# Patient Record
Sex: Female | Born: 1985 | Race: Black or African American | Hispanic: No | Marital: Single | Smoking: Never smoker
Health system: Southern US, Community
[De-identification: ages and names within clinical notes are randomized; demographics above are authoritative.]

## PROBLEM LIST (undated history)

## (undated) ENCOUNTER — Inpatient Hospital Stay (HOSPITAL_COMMUNITY): Payer: Self-pay

## (undated) DIAGNOSIS — M545 Low back pain, unspecified: Secondary | ICD-10-CM

## (undated) DIAGNOSIS — J45909 Unspecified asthma, uncomplicated: Secondary | ICD-10-CM

## (undated) DIAGNOSIS — Z789 Other specified health status: Secondary | ICD-10-CM

## (undated) DIAGNOSIS — O24419 Gestational diabetes mellitus in pregnancy, unspecified control: Secondary | ICD-10-CM

## (undated) DIAGNOSIS — G4730 Sleep apnea, unspecified: Secondary | ICD-10-CM

## (undated) DIAGNOSIS — O139 Gestational [pregnancy-induced] hypertension without significant proteinuria, unspecified trimester: Secondary | ICD-10-CM

## (undated) DIAGNOSIS — R0789 Other chest pain: Secondary | ICD-10-CM

## (undated) DIAGNOSIS — N920 Excessive and frequent menstruation with regular cycle: Secondary | ICD-10-CM

## (undated) DIAGNOSIS — A609 Anogenital herpesviral infection, unspecified: Secondary | ICD-10-CM

## (undated) DIAGNOSIS — F419 Anxiety disorder, unspecified: Secondary | ICD-10-CM

## (undated) DIAGNOSIS — F32A Depression, unspecified: Secondary | ICD-10-CM

## (undated) DIAGNOSIS — T7840XA Allergy, unspecified, initial encounter: Secondary | ICD-10-CM

## (undated) DIAGNOSIS — N83209 Unspecified ovarian cyst, unspecified side: Secondary | ICD-10-CM

## (undated) DIAGNOSIS — Z8632 Personal history of gestational diabetes: Secondary | ICD-10-CM

## (undated) DIAGNOSIS — Z8759 Personal history of other complications of pregnancy, childbirth and the puerperium: Secondary | ICD-10-CM

## (undated) DIAGNOSIS — I10 Essential (primary) hypertension: Secondary | ICD-10-CM

## (undated) DIAGNOSIS — T8859XA Other complications of anesthesia, initial encounter: Secondary | ICD-10-CM

## (undated) DIAGNOSIS — E119 Type 2 diabetes mellitus without complications: Secondary | ICD-10-CM

## (undated) DIAGNOSIS — F909 Attention-deficit hyperactivity disorder, unspecified type: Secondary | ICD-10-CM

## (undated) DIAGNOSIS — Z973 Presence of spectacles and contact lenses: Secondary | ICD-10-CM

## (undated) DIAGNOSIS — T4145XA Adverse effect of unspecified anesthetic, initial encounter: Secondary | ICD-10-CM

## (undated) HISTORY — DX: Unspecified asthma, uncomplicated: J45.909

## (undated) HISTORY — DX: Low back pain, unspecified: M54.50

## (undated) HISTORY — PX: OTHER SURGICAL HISTORY: SHX169

## (undated) HISTORY — DX: Depression, unspecified: F32.A

## (undated) HISTORY — DX: Adverse effect of unspecified anesthetic, initial encounter: T41.45XA

## (undated) HISTORY — PX: DILATION AND CURETTAGE OF UTERUS: SHX78

## (undated) HISTORY — PX: INDUCED ABORTION: SHX677

## (undated) HISTORY — DX: Anxiety disorder, unspecified: F41.9

## (undated) HISTORY — DX: Gestational (pregnancy-induced) hypertension without significant proteinuria, unspecified trimester: O13.9

## (undated) HISTORY — PX: BACK SURGERY: SHX140

## (undated) HISTORY — DX: Allergy, unspecified, initial encounter: T78.40XA

## (undated) HISTORY — DX: Anogenital herpesviral infection, unspecified: A60.9

## (undated) HISTORY — DX: Other complications of anesthesia, initial encounter: T88.59XA

## (undated) HISTORY — DX: Sleep apnea, unspecified: G47.30

---
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
## 2006-01-09 ENCOUNTER — Emergency Department (HOSPITAL_COMMUNITY): Admission: EM | Admit: 2006-01-09 | Discharge: 2006-01-10 | Payer: Self-pay | Admitting: Emergency Medicine

## 2006-01-16 ENCOUNTER — Emergency Department (HOSPITAL_COMMUNITY): Admission: EM | Admit: 2006-01-16 | Discharge: 2006-01-16 | Payer: Self-pay | Admitting: *Deleted

## 2006-08-09 ENCOUNTER — Other Ambulatory Visit: Admission: RE | Admit: 2006-08-09 | Discharge: 2006-08-09 | Payer: Self-pay | Admitting: Gynecology

## 2006-12-07 ENCOUNTER — Emergency Department (HOSPITAL_COMMUNITY): Admission: EM | Admit: 2006-12-07 | Discharge: 2006-12-08 | Payer: Self-pay | Admitting: Emergency Medicine

## 2007-03-02 ENCOUNTER — Ambulatory Visit: Payer: Self-pay | Admitting: Obstetrics & Gynecology

## 2007-03-16 ENCOUNTER — Inpatient Hospital Stay (HOSPITAL_COMMUNITY): Admission: AD | Admit: 2007-03-16 | Discharge: 2007-03-16 | Payer: Self-pay | Admitting: Obstetrics & Gynecology

## 2007-03-18 ENCOUNTER — Ambulatory Visit: Payer: Self-pay | Admitting: Obstetrics & Gynecology

## 2007-05-03 ENCOUNTER — Ambulatory Visit (HOSPITAL_COMMUNITY): Admission: RE | Admit: 2007-05-03 | Discharge: 2007-05-03 | Payer: Self-pay | Admitting: Obstetrics and Gynecology

## 2007-05-03 ENCOUNTER — Ambulatory Visit: Payer: Self-pay | Admitting: Obstetrics & Gynecology

## 2007-05-03 HISTORY — PX: DIAGNOSTIC LAPAROSCOPY: SUR761

## 2007-05-11 ENCOUNTER — Ambulatory Visit: Payer: Self-pay | Admitting: Obstetrics and Gynecology

## 2007-05-11 ENCOUNTER — Inpatient Hospital Stay (HOSPITAL_COMMUNITY): Admission: AD | Admit: 2007-05-11 | Discharge: 2007-05-11 | Payer: Self-pay | Admitting: Obstetrics and Gynecology

## 2007-06-10 ENCOUNTER — Ambulatory Visit: Payer: Self-pay | Admitting: Obstetrics & Gynecology

## 2009-06-07 ENCOUNTER — Inpatient Hospital Stay (HOSPITAL_COMMUNITY): Admission: AD | Admit: 2009-06-07 | Discharge: 2009-06-07 | Payer: Self-pay | Admitting: Obstetrics & Gynecology

## 2009-11-02 ENCOUNTER — Ambulatory Visit: Payer: Self-pay | Admitting: Gynecology

## 2009-11-02 ENCOUNTER — Inpatient Hospital Stay (HOSPITAL_COMMUNITY): Admission: AD | Admit: 2009-11-02 | Discharge: 2009-11-02 | Payer: Self-pay | Admitting: Obstetrics & Gynecology

## 2010-07-27 LAB — WET PREP, GENITAL
Trich, Wet Prep: NONE SEEN
Trich, Wet Prep: NONE SEEN
Yeast Wet Prep HPF POC: NONE SEEN
Yeast Wet Prep HPF POC: NONE SEEN

## 2010-07-27 LAB — URINALYSIS, ROUTINE W REFLEX MICROSCOPIC
Bilirubin Urine: NEGATIVE
Hgb urine dipstick: NEGATIVE
Ketones, ur: NEGATIVE mg/dL
Nitrite: NEGATIVE
pH: 7 (ref 5.0–8.0)

## 2010-07-27 LAB — POCT PREGNANCY, URINE: Preg Test, Ur: NEGATIVE

## 2010-07-27 LAB — GC/CHLAMYDIA PROBE AMP, GENITAL: Chlamydia, DNA Probe: NEGATIVE

## 2010-09-23 NOTE — Group Therapy Note (Signed)
NAMEELMYRA, [name] NO.:  [id number]   MEDICAL RECORD NO.:  [id number]          PATIENT TYPE:  WOC   LOCATION:  [facility]                   FACILITY:  [facility]   PHYSICIAN:  Johnella Moloney, MD        DATE OF BIRTH:  [birth year]   DATE OF SERVICE:                                  CLINIC NOTE   CHIEF COMPLAINT:  Chronic pelvic pain.   HISTORY OF PRESENT ILLNESS:  The patient is a 25 year old, G1, P0-0-1-0,  with a 2-year history of chronic pelvic pain.  The patient does report  that over the last 2 years she has had increase in pelvic pain.  It  usually starts a few days before her period and lasts through her  periods.  It is severe in intensity and very debilitating and it  interferes with her going to school.  She had a recent ultrasound in  September 2008, which showed a 3-cm complex ovarian cyst concerning for  possible endometrioma.  She also had other workup including cervical  cultures which are negative and a urine culture which was also negative.  She was to be scheduled for a diagnostic laparoscopy with her former  physician but was unable to do it because of insurance issues.  She was  then referred here for further evaluation and also for scheduling of  laparoscopy.   REVIEW OF SYSTEMS:  The patient does report having significant urgency  especially during her menstrual period.  She feels like this is  exacerbated by te amount of pain that she has.  At baseline she denies  any other urinary symptoms or gastrointestinal symptoms.  The patient is  also sexually active but denies any dyspareunia or any other symptoms.   PAST MEDICAL HISTORY:  None.   PAST SURGICAL HISTORY:  None.   PAST OBSTETRICAL/GYNECOLOGIC HISTORY:  1. One miscarriage.  2. Menarche at age 22 with regular periods lasting 4 days.  Severe      pain with periods.  No intramenstrual bleeding.  3. The patient is currently on Yaz which says does not help her pain.   MEDICATIONS:  Yaz 1  tab p.o. daily.   ALLERGIES:  1. PENICILLIN.  2. ERYTHROMYCIN which cause hives.   SOCIAL HISTORY:  The patient is a Consulting civil engineer.  She denies any abuse.  Denies any smoking, alcohol, or any other illicit drugs.   REVIEW OF SYSTEMS:  The patient does report some shortness of breath  with exercise but has not been evaluated by her primary care physician.  No other symptoms.   PHYSICAL EXAMINATION:  VITAL SIGNS:  Temperature 98.1, blood pressure  113/70, pulse 85, weight 145 pounds.  GENERAL:  No apparent distress.  PELVIC:  Mild tenderness to palpation of uterus and bilateral adnexa,  right greater than left.  No cervical motion tenderness.  Normal size  uterus palpated that was mobile.  No abnormal masses palpated.  Normal  external female genitalia.   ASSESSMENT/PLAN:  The patient is a 25 year old with a history of chronic  pelvic pain.  Her history and description of the pain is concerning for  possible  endometriosis.  We will go ahead and schedule the patient for  diagnostic laparoscopy for possible evaluation for endometriosis.  Also  discussed with the patient that if endometriosis is found, she might  need further therapy after surgery such as further oral contraceptive  therapy versus Lupron.  We will defer detailed discussion about these  modalities until after surgery.  The patient will be contacted with  surgery date.           ______________________________  Johnella Moloney, MD     UD/MEDQ  D:  03/02/2007  T:  03/03/2007  Job:  [id number]

## 2010-09-23 NOTE — Op Note (Signed)
NAMEJASIEL, [name] NO.:  [id number]   MEDICAL RECORD NO.:  [id number]          PATIENT TYPE:  AMB   LOCATION:  [facility]                           FACILITY:  [facility]   PHYSICIAN:  Norton Blizzard, MD    DATE OF BIRTH:  04-02-1986   DATE OF PROCEDURE:  05/03/2007  DATE OF DISCHARGE:                               OPERATIVE REPORT   PREOPERATIVE DIAGNOSIS:  Chronic pelvic pain.   POSTOPERATIVE DIAGNOSIS:  Chronic pelvic pain.   OPERATION:  Diagnostic laparoscopy.   SURGEON:  Johnella Moloney, M.D.   ASSISTANT:  Lesly Dukes, M.D.   ANESTHESIA:  General.   IV FLUIDS:  1000 mL.   ESTIMATED BLOOD LOSS:  Minimal.   URINE OUTPUT:  20 mL.   INDICATIONS:  The patient is a 25 year old G1, P0-0-1-0, with a history  of chronic pelvic pain for 2 years.  On a prior scan, the patient was  noted to have a 3 cm complex ovarian cyst concerning for an  endometrioma.  Given this concern, she was booked for diagnostic  laparoscopy for further evaluation.  Of note, on a followup scan, the  patient's ovarian cyst was noted to be resolved; however, the patient  wanted to proceed with surgery, given her long history of chronic pelvic  pain, requiring narcotics.  On the day of surgery, the risks of the  surgery were discussed, and written informed consent was obtained.   FINDINGS:  Normal uterus, ovaries, tubes, pelvic and abdominal anatomy.  No endometrial lesions noted.   SPECIMENS:  None.   PROCEDURE IN DETAIL:  The patient was taken to the operating room where  general anesthesia was placed and found to be adequate.  She was then  placed in the dorsal lithotomy position, and prepped and draped in a  sterile manner.  A uterine manipulator was placed, and a Foley was also  attached to her bladder and attached to constant gravity.  Attention was  then turned to the patient's umbilicus where an incision was made, and a  Veress needle was inserted into the peritoneal cavity.   Correct  placement was confirmed with low intra-abdominal pressure upon gas  insufflation; opening pressure was noted to be 6 mmHg.  The abdomen was  insufflated to 15 mmHg, and the Veress needle was removed, and a 10 mm  trocar was then placed.  The laparoscope was then placed, and also used  to confirm correct placement.  Of note, the operative laparoscope was  placed at this time.  A survey of the pelvis revealed normal anatomy and  no endometriotic lesions.  Survey of the upper abdomen also revealed  normal anatomy, and no lesions were seen on all organs.  A blunt probe  was used to move the bowels away, and further visualization of her  bilateral adnexae and cul-de-sac revealed no lesions of endometriosis,  or any other etiology for her pelvic pain.  The laparoscope was then  removed.  The abdomen was desufflated, and the incisional site fascia  was reapproximated with a 0 Vicryl figure-of-eight stitch.  The skin was  closed with 4-0 Monocryl in interrupted stitches.  The patient tolerated  the procedure well.  Sponge, lap, needle, and instrument counts were  correct x2.  The uterine manipulator and the Foley were removed at the  end of the case.  She was taken to the recovery room awake, extubated,  and in stable condition.      Norton Blizzard, MD  Electronically Signed     UAD/MEDQ  D:  05/03/2007  T:  05/03/2007  Job:  [id number]

## 2011-01-11 ENCOUNTER — Inpatient Hospital Stay (HOSPITAL_COMMUNITY)
Admission: AD | Admit: 2011-01-11 | Discharge: 2011-01-11 | Disposition: A | Discharge status: Home / Self Care | Payer: Self-pay | Source: Ambulatory Visit | Attending: Obstetrics and Gynecology | Admitting: Obstetrics and Gynecology

## 2011-01-11 DIAGNOSIS — H109 Unspecified conjunctivitis: Secondary | ICD-10-CM

## 2011-01-11 MED ORDER — TOBRAMYCIN-DEXAMETHASONE 0.3-0.1 % OP SUSP: 1.0000 [drp] | Freq: Two times a day (BID) | OPHTHALMIC | Status: AC

## 2011-01-11 NOTE — Progress Notes (Signed)
Pt c/o having pink eye in both eyes.

## 2011-01-11 NOTE — ED Provider Notes (Signed)
History     Chief Complaint  Patient presents with  . Conjunctivitis   HPI Presents with complaint of "pink eye".  Has had redness and discharge from her eyes for 2 days. States was originally only in left eye, now both. Took contacts out yesterday.  Cannot see eye doctor until Saturday.  No past medical history on file.  No past surgical history on file.  No family history on file.  History  Substance Use Topics  . Smoking status: Not on file  . Smokeless tobacco: Not on file  . Alcohol Use: Not on file    Allergies: Allergies not on file  No prescriptions prior to admission    ROS As above  Physical Exam   Blood pressure 132/91, pulse 73, temperature 98.1 F (36.7 C), resp. rate 16, last menstrual period 01/08/2011.  Physical Exam  Constitutional: She is oriented to person, place, and time. She appears well-developed and well-nourished.  HENT:  Head: Normocephalic.  Eyes: Right eye exhibits discharge. Left eye exhibits discharge.       Bilateral injection of sclera and conjuctiva, mild to moderate.   Neurological: She is alert and oriented to person, place, and time.  Skin: Skin is warm and dry.  Psychiatric: She has a normal mood and affect.    MAU Course  Procedures  MDM   Assessment and Plan  A:  Bilateral conjuctivitis, unclear whether viral or bacterial, but based on report of drainage will presume bacterial P:  Rx Tobradex Advised to see Eye Doctor this week Advised to avoid wearing contact lenses until after treatment and approved by Opthamologist.  Wynelle Bourgeois 01/11/2011, 10:23 AM

## 2011-02-13 LAB — CBC
MCHC: 35.2
Platelets: 330
RBC: 4.59
WBC: 5.9

## 2011-02-13 LAB — PREGNANCY, URINE: Preg Test, Ur: NEGATIVE

## 2011-02-13 LAB — URINALYSIS, ROUTINE W REFLEX MICROSCOPIC
Bilirubin Urine: NEGATIVE
Glucose, UA: NEGATIVE
Hgb urine dipstick: NEGATIVE
Ketones, ur: NEGATIVE
Specific Gravity, Urine: <1.005 — ABNORMAL LOW
pH: 7

## 2011-02-17 LAB — WET PREP, GENITAL
Clue Cells Wet Prep HPF POC: NONE SEEN
Trich, Wet Prep: NONE SEEN
Yeast Wet Prep HPF POC: NONE SEEN

## 2011-02-17 LAB — URINALYSIS, ROUTINE W REFLEX MICROSCOPIC
Bilirubin Urine: NEGATIVE
Glucose, UA: NEGATIVE
Hgb urine dipstick: NEGATIVE
Ketones, ur: NEGATIVE
Nitrite: NEGATIVE
Protein, ur: NEGATIVE
Specific Gravity, Urine: 1.02
Urobilinogen, UA: 2 — ABNORMAL HIGH
pH: 6

## 2011-02-17 LAB — POCT PREGNANCY, URINE
Operator id: 266701
Preg Test, Ur: NEGATIVE

## 2011-02-17 LAB — GC/CHLAMYDIA PROBE AMP, GENITAL: Chlamydia, DNA Probe: NEGATIVE

## 2011-02-23 LAB — WET PREP, GENITAL
Trich, Wet Prep: NONE SEEN
Yeast Wet Prep HPF POC: NONE SEEN

## 2011-02-23 LAB — URINALYSIS, ROUTINE W REFLEX MICROSCOPIC
Bilirubin Urine: NEGATIVE
Glucose, UA: NEGATIVE
Hgb urine dipstick: NEGATIVE
Ketones, ur: NEGATIVE
Protein, ur: 100 — AB
pH: 6

## 2011-02-23 LAB — GC/CHLAMYDIA PROBE AMP, GENITAL
Chlamydia, DNA Probe: NEGATIVE
GC Probe Amp, Genital: NEGATIVE

## 2011-02-23 LAB — URINE MICROSCOPIC-ADD ON

## 2011-03-25 ENCOUNTER — Emergency Department (HOSPITAL_COMMUNITY): Payer: Self-pay

## 2011-03-25 ENCOUNTER — Encounter: Payer: Self-pay | Admitting: Emergency Medicine

## 2011-03-25 ENCOUNTER — Emergency Department (HOSPITAL_COMMUNITY)
Admission: EM | Admit: 2011-03-25 | Discharge: 2011-03-25 | Disposition: A | Discharge status: Home / Self Care | Payer: Self-pay | Attending: Emergency Medicine | Admitting: Emergency Medicine

## 2011-03-25 DIAGNOSIS — Z79899 Other long term (current) drug therapy: Secondary | ICD-10-CM | POA: Insufficient documentation

## 2011-03-25 DIAGNOSIS — R059 Cough, unspecified: Secondary | ICD-10-CM | POA: Insufficient documentation

## 2011-03-25 DIAGNOSIS — J40 Bronchitis, not specified as acute or chronic: Secondary | ICD-10-CM | POA: Insufficient documentation

## 2011-03-25 DIAGNOSIS — R509 Fever, unspecified: Secondary | ICD-10-CM | POA: Insufficient documentation

## 2011-03-25 DIAGNOSIS — R05 Cough: Secondary | ICD-10-CM | POA: Insufficient documentation

## 2011-03-25 DIAGNOSIS — R5381 Other malaise: Secondary | ICD-10-CM | POA: Insufficient documentation

## 2011-03-25 DIAGNOSIS — R0789 Other chest pain: Secondary | ICD-10-CM | POA: Insufficient documentation

## 2011-03-25 DIAGNOSIS — R000 Tachycardia, unspecified: Secondary | ICD-10-CM | POA: Insufficient documentation

## 2011-03-25 DIAGNOSIS — J3489 Other specified disorders of nose and nasal sinuses: Secondary | ICD-10-CM | POA: Insufficient documentation

## 2011-03-25 DIAGNOSIS — R0602 Shortness of breath: Secondary | ICD-10-CM | POA: Insufficient documentation

## 2011-03-25 DIAGNOSIS — R5383 Other fatigue: Secondary | ICD-10-CM | POA: Insufficient documentation

## 2011-03-25 LAB — POCT I-STAT, CHEM 8
BUN: 5 mg/dL — ABNORMAL LOW (ref 6–23)
Creatinine, Ser: 1.2 mg/dL — ABNORMAL HIGH (ref 0.50–1.10)
Glucose, Bld: 77 mg/dL (ref 70–99)
Hemoglobin: 16 g/dL — ABNORMAL HIGH (ref 12.0–15.0)
Potassium: 4 mEq/L (ref 3.5–5.1)

## 2011-03-25 MED ORDER — IBUPROFEN 800 MG PO TABS
800.0000 mg | ORAL_TABLET | Freq: Once | ORAL | Status: AC
  Administered 2011-03-25: 800 mg via ORAL
  Filled 2011-03-25: qty 1

## 2011-03-25 MED ORDER — ALBUTEROL SULFATE HFA 108 (90 BASE) MCG/ACT IN AERS: 2.0000 | INHALATION_SPRAY | RESPIRATORY_TRACT | Status: DC | PRN

## 2011-03-25 MED ORDER — AZITHROMYCIN 250 MG PO TABS: 250.0000 mg | ORAL_TABLET | Freq: Every day | ORAL | Status: AC

## 2011-03-25 NOTE — ED Notes (Signed)
Pt ambulated to window with 2 prescriptions.

## 2011-03-25 NOTE — ED Notes (Signed)
Pt c/o chest congestion and dry cough. Denies n/v/d. Pt reports chills and fever for 2 days. Pt reports feeling tired and "like crap".

## 2011-03-25 NOTE — ED Provider Notes (Signed)
History     CSN: [id number] Arrival date & time: 03/25/2011  8:57 AM   First MD Initiated Contact with Patient 03/25/11 0908      No chief complaint on file.   (Consider location/radiation/quality/duration/timing/severity/associated sxs/prior treatment) HPI Comments: Patient presents with 3 days of chest tightness, cough, congestion, subjective fevers and chills. She sent home from work today because the symptoms. She's taking over-the-counter products including Robitussin for the symptoms. She describes cough is nonproductive. She's no history of asthma or smoke exposure. The tightness is in the center of her chest worsens with coughing. Denies any nausea, vomiting abdominal pain or diarrhea. No sick contacts.  She is not on birth control.  Endorses fatigue.  No sore throat, ear pain, pain with swallowing, rash.  The history is provided by the patient.    No past medical history on file.  No past surgical history on file.  No family history on file.  History  Substance Use Topics  . Smoking status: Never Smoker   . Smokeless tobacco: Never Used  . Alcohol Use:     OB History    Grav Para Term Preterm Abortions TAB SAB Ect Mult Living                  Review of Systems  Constitutional: Positive for fever and fatigue. Negative for activity change and appetite change.  HENT: Positive for congestion and rhinorrhea.   Eyes: Negative for visual disturbance.  Respiratory: Positive for cough, chest tightness and shortness of breath. Negative for wheezing.   Cardiovascular: Positive for chest pain.  Gastrointestinal: Negative for nausea, vomiting and abdominal pain.  Genitourinary: Negative for dysuria and hematuria.  Musculoskeletal: Negative for back pain.  Skin: Negative for rash.  Neurological: Negative for weakness and headaches.  Hematological: Negative.   Psychiatric/Behavioral: Negative.     Allergies  Penicillins  Home Medications   Current Outpatient Rx    Name Route Sig Dispense Refill  . GUAIFENESIN 100 MG/5ML PO SOLN Oral Take 20 mLs by mouth every 4 (four) hours as needed. Cough      . NAPROXEN SODIUM 220 MG PO TABS Oral Take 440 mg by mouth as needed. Pain     . OVER THE COUNTER MEDICATION Oral Take 1 Package by mouth daily. Gnc vitamin pack      . ALBUTEROL SULFATE HFA 108 (90 BASE) MCG/ACT IN AERS Inhalation Inhale 2 puffs into the lungs every 4 (four) hours as needed for wheezing. 1 Inhaler 0  . AZITHROMYCIN 250 MG PO TABS Oral Take 1 tablet (250 mg total) by mouth daily. Take first 2 tablets together, then 1 every day until finished. 6 tablet 0    BP 108/78  Pulse 120  Temp(Src) 99 F (37.2 C) (Oral)  Resp 18  SpO2 100%  LMP 03/18/2011  Physical Exam  Constitutional: She is oriented to person, place, and time. She appears well-developed and well-nourished. No distress.  HENT:  Head: Normocephalic and atraumatic.  Right Ear: External ear normal.  Left Ear: External ear normal.  Mouth/Throat: Oropharynx is clear and moist. No oropharyngeal exudate.  Eyes: Conjunctivae are normal. Pupils are equal, round, and reactive to light.  Neck: Normal range of motion.  Cardiovascular: Normal rate, regular rhythm and normal heart sounds.        Tachycardic  Pulmonary/Chest: Effort normal and breath sounds normal. No respiratory distress. She has no wheezes.  Abdominal: Soft. There is no tenderness. There is no rebound and no guarding.  Musculoskeletal: Normal range of motion. She exhibits no edema and no tenderness.       No leg pain or swelling.  Neurological: She is alert and oriented to person, place, and time. No cranial nerve deficit.  Skin: Skin is warm.    ED Course  Procedures (including critical care time)  Labs Reviewed  POCT I-STAT, CHEM 8 - Abnormal; Notable for the following:    BUN 5 (*)    Creatinine, Ser 1.20 (*)    Hemoglobin 16.0 (*)    HCT 47.0 (*)    All other components within normal limits  D-DIMER,  QUANTITATIVE  I-STAT, CHEM 8   Dg Chest 2 View  03/25/2011  *RADIOLOGY REPORT*  Clinical Data: Breath, mid chest pain for 2 days  CHEST - 2 VIEW  Comparison: None.  Findings: No active infiltrate or effusion is seen.  Mild peribronchial thickening is noted.  Mediastinal contours appear normal.  The heart is within normal limits in size.  No bony abnormality is seen.  IMPRESSION: No pneumonia.  Mild peribronchial thickening  Original Report Authenticated By: Juline Patch, M.D.     1. Bronchitis       MDM  Cough, congestion, subjective fevers without evidence of respiratory distress or hypoxia. Likely bronchitis. Patient is tachycardic on exam and complaining of chest pain. We'll obtain EKG and d-dimer.   Date: 03/25/2011  Rate: 100  Rhythm: normal sinus rhythm  QRS Axis: normal  Intervals: normal  ST/T Wave abnormalities: normal  Conduction Disutrbances:none  Narrative Interpretation:   Old EKG Reviewed: none available  Chest Xray neg.  D dimer neg.  HR has improved.  Will discharge with albuterol inhaler for bronchitis.        Glynn Octave, MD 03/25/11 1128

## 2011-03-25 NOTE — ED Notes (Signed)
Pt understanding of plan of care and ready for d/c. Walked to window.

## 2011-03-25 NOTE — ED Notes (Signed)
Patient transported to X-ray 

## 2011-04-13 ENCOUNTER — Inpatient Hospital Stay (HOSPITAL_COMMUNITY)
Admission: AD | Admit: 2011-04-13 | Discharge: 2011-04-13 | Disposition: A | Discharge status: Home / Self Care | Payer: Self-pay | Source: Ambulatory Visit | Attending: Obstetrics & Gynecology | Admitting: Obstetrics & Gynecology

## 2011-04-13 ENCOUNTER — Encounter (HOSPITAL_BASED_OUTPATIENT_CLINIC_OR_DEPARTMENT_OTHER): Payer: Self-pay | Admitting: *Deleted

## 2011-04-13 ENCOUNTER — Emergency Department (INDEPENDENT_AMBULATORY_CARE_PROVIDER_SITE_OTHER): Payer: Self-pay

## 2011-04-13 ENCOUNTER — Emergency Department (HOSPITAL_BASED_OUTPATIENT_CLINIC_OR_DEPARTMENT_OTHER)
Admission: EM | Admit: 2011-04-13 | Discharge: 2011-04-13 | Disposition: A | Discharge status: Home / Self Care | Payer: Self-pay | Attending: Emergency Medicine | Admitting: Emergency Medicine

## 2011-04-13 DIAGNOSIS — Y92410 Unspecified street and highway as the place of occurrence of the external cause: Secondary | ICD-10-CM | POA: Insufficient documentation

## 2011-04-13 DIAGNOSIS — S8000XA Contusion of unspecified knee, initial encounter: Secondary | ICD-10-CM | POA: Insufficient documentation

## 2011-04-13 DIAGNOSIS — M25569 Pain in unspecified knee: Secondary | ICD-10-CM

## 2011-04-13 MED ORDER — IBUPROFEN 800 MG PO TABS
800.0000 mg | ORAL_TABLET | Freq: Once | ORAL | Status: AC
  Administered 2011-04-13: 800 mg via ORAL
  Filled 2011-04-13: qty 1

## 2011-04-13 MED ORDER — IBUPROFEN 800 MG PO TABS: 800.0000 mg | ORAL_TABLET | Freq: Three times a day (TID) | ORAL | Status: AC
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
## 2011-04-13 NOTE — ED Notes (Signed)
Patient transported to X-ray 

## 2011-08-08 ENCOUNTER — Inpatient Hospital Stay (HOSPITAL_COMMUNITY)
Admission: AD | Admit: 2011-08-08 | Discharge: 2011-08-09 | Disposition: A | Discharge status: Home / Self Care | Payer: BC Managed Care – PPO | Source: Ambulatory Visit | Attending: Obstetrics & Gynecology | Admitting: Obstetrics & Gynecology

## 2011-08-08 ENCOUNTER — Encounter (HOSPITAL_COMMUNITY): Payer: Self-pay | Admitting: *Deleted

## 2011-08-08 ENCOUNTER — Inpatient Hospital Stay (HOSPITAL_COMMUNITY): Payer: BC Managed Care – PPO

## 2011-08-08 DIAGNOSIS — Z332 Encounter for elective termination of pregnancy: Secondary | ICD-10-CM

## 2011-08-08 DIAGNOSIS — R109 Unspecified abdominal pain: Secondary | ICD-10-CM | POA: Insufficient documentation

## 2011-08-08 DIAGNOSIS — Z9889 Other specified postprocedural states: Secondary | ICD-10-CM

## 2011-08-08 DIAGNOSIS — O074 Failed attempted termination of pregnancy without complication: Secondary | ICD-10-CM | POA: Insufficient documentation

## 2011-08-08 DIAGNOSIS — R112 Nausea with vomiting, unspecified: Secondary | ICD-10-CM

## 2011-08-08 HISTORY — DX: Unspecified ovarian cyst, unspecified side: N83.209

## 2011-08-08 HISTORY — DX: Other specified health status: Z78.9

## 2011-08-08 LAB — URINALYSIS, MICROSCOPIC ONLY
Bilirubin Urine: NEGATIVE
Glucose, UA: 100 mg/dL — AB
Ketones, ur: NEGATIVE mg/dL
Specific Gravity, Urine: <1.005 — ABNORMAL LOW (ref 1.005–1.030)
pH: 6 (ref 5.0–8.0)

## 2011-08-08 LAB — CBC
Hemoglobin: 14 g/dL (ref 12.0–15.0)
MCH: 30.8 pg (ref 26.0–34.0)
MCHC: 34.9 g/dL (ref 30.0–36.0)
MCV: 88.1 fL (ref 78.0–100.0)

## 2011-08-08 LAB — ABO/RH: ABO/RH(D): A POS

## 2011-08-08 MED ORDER — ONDANSETRON HCL 4 MG/2ML IJ SOLN
4.0000 mg | Freq: Once | INTRAMUSCULAR | Status: AC
  Administered 2011-08-08: 4 mg via INTRAVENOUS
  Filled 2011-08-08: qty 2

## 2011-08-08 MED ORDER — DEXTROSE 5 % IN LACTATED RINGERS IV BOLUS
1000.0000 mL | Freq: Once | INTRAVENOUS | Status: AC
  Administered 2011-08-08: 1000 mL via INTRAVENOUS

## 2011-08-08 MED ORDER — KETOROLAC TROMETHAMINE 60 MG/2ML IM SOLN
60.0000 mg | Freq: Once | INTRAMUSCULAR | Status: AC
  Administered 2011-08-08: 60 mg via INTRAMUSCULAR
  Filled 2011-08-08: qty 2

## 2011-08-08 NOTE — MAU Note (Signed)
Ivonne Andrew CNM made aware of pt's arrival and status.

## 2011-08-08 NOTE — MAU Provider Note (Signed)
History   [name] is a 26 y.o. year old G30P0020 female at 14.[redacted] weeks gestation who presents to MAU reporting severe cramping, moderate VB after taking cytotec Rx by AB clinic in [place] yesterday. Passed 1-1.5 cm whitish sac at home that looked like POC from prior EABs. States she had a [place] at the AB clinic. Reports not eating today per clinic instructions and feeling weak.  First Provider Initiated Contact with Patient 08/08/11 2122     CSN: [id number]  Arrival date & time 08/08/11  2040   None     Chief Complaint  Patient presents with  . Abdominal Pain  . Vaginal Bleeding    (Consider location/radiation/quality/duration/timing/severity/associated sxs/prior treatment) HPI  Past Medical History  Diagnosis Date  . No pertinent past medical history   . Ovarian cyst     Past Surgical History  Procedure Date  . Dilation and curettage of uterus   . Laporocscopy     Family History  Problem Relation Age of Onset  . Anesthesia problems Neg Hx   . Hypotension Neg Hx   . Malignant hyperthermia Neg Hx   . Pseudochol deficiency Neg Hx     History  Substance Use Topics  . Smoking status: Current Some Day Smoker  . Smokeless tobacco: Never Used  . Alcohol Use: No    OB History    Grav Para Term Preterm Abortions TAB SAB Ect Mult Living   3 0 0 0 2 2 0 0 0 0       Review of Systems  Constitutional: Negative for fever, chills and appetite change.  Gastrointestinal: Positive for nausea, vomiting and abdominal pain (low abd  cramping). Negative for diarrhea.  Genitourinary: Positive for vaginal bleeding (moderate--4 pads saturated in 1 day). Negative for dysuria and flank pain.  Skin: Negative for pallor.  Neurological: Positive for weakness. Negative for dizziness and syncope.  Hematological: Does not bruise/bleed easily.    Allergies  Penicillins  Home Medications  No current outpatient prescriptions on file.  BP 130/83  Pulse 84  Temp(Src) 97.7 F  (36.5 C) (Oral)  Resp 20  Ht 5\' 9"  (1.753 m)  Wt 73.483 kg (162 lb)  BMI 23.92 kg/m2  LMP 05/01/2011  Physical Exam  Nursing note and vitals reviewed. Constitutional: She is oriented to person, place, and time. She appears well-developed and well-nourished. She appears distressed (mildly).  Cardiovascular: Normal rate.   Pulmonary/Chest: Effort normal.  Abdominal: Soft. She exhibits no distension. There is no tenderness.  Genitourinary: There is no lesion on the right labia. There is no lesion on the left labia. Uterus is not enlarged and not tender. Cervix exhibits no motion tenderness, no discharge and no friability. Right adnexum displays no mass, no tenderness and no fullness. Left adnexum displays no mass, no tenderness and no fullness. There is bleeding (moderate BRB in vault. Small amount of bleeding from os.) around the vagina. No vaginal discharge found.  Neurological: She is alert and oriented to person, place, and time.  Skin: Skin is warm and dry. No pallor.  Psychiatric: She has a normal mood and affect.    ED Course  Procedures (including critical care time)  Results for orders placed during the hospital encounter of 08/08/11 (from the past 24 hour(s))  CBC     Status: Normal   Collection Time   08/08/11  9:20 PM      Component Value Range   WBC 8.1  4.0 - 10.5 (K/uL)   RBC 4.55  3.87 - 5.11 (MIL/uL)   Hemoglobin 14.0  12.0 - 15.0 (g/dL)   HCT 16.1  09.6 - 04.5 (%)   MCV 88.1  78.0 - 100.0 (fL)   MCH 30.8  26.0 - 34.0 (pg)   MCHC 34.9  30.0 - 36.0 (g/dL)   RDW 40.9  81.1 - 91.4 (%)   Platelets 323  150 - 400 (K/uL)  ABO/RH     Status: Normal   Collection Time   08/08/11  9:20 PM      Component Value Range   ABO/RH(D) A POS    HCG, QUANTITATIVE, PREGNANCY     Status: Abnormal   Collection Time   08/08/11  9:20 PM      Component Value Range   hCG, Beta Chain, Quant, S 78295 (*) <5 (mIU/mL)  URINALYSIS, WITH MICROSCOPIC     Status: Abnormal   Collection Time    08/08/11 11:00 PM      Component Value Range   Color, Urine YELLOW  YELLOW    APPearance CLEAR  CLEAR    Specific Gravity, Urine <1.005 (*) 1.005 - 1.030    pH 6.0  5.0 - 8.0    Glucose, UA 100 (*) NEGATIVE (mg/dL)   Hgb urine dipstick LARGE (*) NEGATIVE    Bilirubin Urine NEGATIVE  NEGATIVE    Ketones, ur NEGATIVE  NEGATIVE (mg/dL)   Protein, ur NEGATIVE  NEGATIVE (mg/dL)   Urobilinogen, UA 0.2  0.0 - 1.0 (mg/dL)   Nitrite NEGATIVE  NEGATIVE    Leukocytes, UA NEGATIVE  NEGATIVE    RBC / HPF 21-50  <3 (RBC/hpf)   Squamous Epithelial / LPF RARE  RARE    Urine-Other MUCOUS PRESENT    POCT PREGNANCY, URINE     Status: Abnormal   Collection Time   08/09/11  1:27 AM      Component Value Range   Preg Test, Ur POSITIVE (*) NEGATIVE    (Urine specimen collected after IV fluids initiated due to pt being unable to void prior.)  US Ob Comp Less 14 Wks  08/08/2011  *RADIOLOGY REPORT*  Clinical Data: Status post Cytotec today.  Rule out retained products of conception.  Bleeding and cramping.  5 weeks 2-day estimated gestational age by LMP.  No reported beta HCG.  OBSTETRIC <14 WK [place] AND TRANSVAGINAL OB US  Technique:  Both transabdominal and transvaginal ultrasound examinations were performed for complete evaluation of the gestation as well as the maternal uterus, adnexal regions, and pelvic cul-de-sac.  Transvaginal technique was performed to assess early pregnancy.  Comparison:  12/08/2006  Intrauterine gestational sac:  No intrauterine gestational sac visualized.  Heterogeneous appearing thickened endometrium measuring about 17 mm thickness.  No significant flow demonstrated on color flow Doppler imaging.  Findings are nonspecific and could represent retained products of conception versus blood clots. Yolk sac: Not visualized Embryo: Not visualized Cardiac Activity: Not visualized Heart Rate: N/A bpm  MSD: N/A  mm  N/A    w N/A    d CRL: N/A   mm  N/A   w  N/A   d           [place] EDC: N/A  Maternal  uterus/adnexae: No focal myometrial masses.  Left ovary measures 2.6 x 1.2 x 1.4 cm.  Normal follicular changes.  Right ovary measures 4.4 x 3 by 2.9 cm.  Simple cyst in the right ovary measuring about 2.5 cm diameter likely representing corpus luteum.  Small amount of free fluid in  the pelvis.  IMPRESSION: No intrauterine gestational sac visualized.  Thickened and heterogeneous appearance of the endometrium is nonspecific and could represent retained products of conception or blood clots. Probable corpus luteum cyst in the right ovary.  Small amount of free fluid.  Correlation with serial quantitative beta HCG levels may be useful in further evaluation if clinically indicated.  Original Report Authenticated By: Marlon Pel, M.D.   US Ob Transvaginal  08/08/2011  *RADIOLOGY REPORT*  Clinical Data: Status post Cytotec today.  Rule out retained products of conception.  Bleeding and cramping.  5 weeks 2-day estimated gestational age by LMP.  No reported beta HCG.  OBSTETRIC <14 WK [place] AND TRANSVAGINAL OB US  Technique:  Both transabdominal and transvaginal ultrasound examinations were performed for complete evaluation of the gestation as well as the maternal uterus, adnexal regions, and pelvic cul-de-sac.  Transvaginal technique was performed to assess early pregnancy.  Comparison:  12/08/2006  Intrauterine gestational sac:  No intrauterine gestational sac visualized.  Heterogeneous appearing thickened endometrium measuring about 17 mm thickness.  No significant flow demonstrated on color flow Doppler imaging.  Findings are nonspecific and could represent retained products of conception versus blood clots. Yolk sac: Not visualized Embryo: Not visualized Cardiac Activity: Not visualized Heart Rate: N/A bpm  MSD: N/A  mm  N/A    w N/A    d CRL: N/A   mm  N/A   w  N/A   d           [place] EDC: N/A  Maternal uterus/adnexae: No focal myometrial masses.  Left ovary measures 2.6 x 1.2 x 1.4 cm.  Normal follicular  changes.  Right ovary measures 4.4 x 3 by 2.9 cm.  Simple cyst in the right ovary measuring about 2.5 cm diameter likely representing corpus luteum.  Small amount of free fluid in the pelvis.  IMPRESSION: No intrauterine gestational sac visualized.  Thickened and heterogeneous appearance of the endometrium is nonspecific and could represent retained products of conception or blood clots. Probable corpus luteum cyst in the right ovary.  Small amount of free fluid.  Correlation with serial quantitative beta HCG levels may be useful in further evaluation if clinically indicated.  Original Report Authenticated By: Marlon Pel, M.D.   Tolerating POs after Zofran. Feeling better after IV fluids and toradol Minimal bleeding while in MAU  1. Status post elective abortion, documentation of IUP not available   2. Nausea & vomiting    MDM  D/C home Bleeding precautions Rx Zofran and Ibuprofen F/U in MAU in 1 week for quant Discussed contraception. Will F/U w/ [name], [name] 08/08/2011 9:08 PM

## 2011-08-08 NOTE — MAU Note (Signed)
Pt admitted via EMS to Rm # 5. Alert and oriented. Vomiting on arrival. Helped to change clothes. Warm blanket. Small amt vag bleeding. Call light within reach.

## 2011-08-08 NOTE — MAU Note (Signed)
G2P0 Went to abortion clinic in [place] yesterday. Given pills to cause abortion. Took one yest in office and 6 pills today. Thinks passed fetus at home. Having stomach pain and vaginal bleeding. Some vomiting

## 2011-08-08 NOTE — Progress Notes (Signed)
V.Smith CNM in to see pt. Spec exam done. Small-mod vag bleeding.

## 2011-08-09 LAB — HCG, QUANTITATIVE, PREGNANCY: hCG, Beta Chain, Quant, S: 10623 m[IU]/mL — ABNORMAL HIGH (ref <5)

## 2011-08-09 MED ORDER — IBUPROFEN 600 MG PO TABS: 600.0000 mg | ORAL_TABLET | Freq: Four times a day (QID) | ORAL | Status: DC | PRN

## 2011-08-09 MED ORDER — PROMETHAZINE HCL 25 MG PO TABS: 25.0000 mg | ORAL_TABLET | Freq: Four times a day (QID) | ORAL | Status: DC | PRN

## 2011-08-09 NOTE — MAU Note (Signed)
Dorathy Kinsman CNM in to discuss u/s results and d/c plan with pt. Pt to return in one wk for repeat BHCG. Pt agrees

## 2011-08-09 NOTE — Progress Notes (Signed)
Ivonne Andrew CNM in earlier to discuss d/c plan. Written and verbal d/c instructions given and understanding voiced

## 2011-08-12 ENCOUNTER — Encounter (HOSPITAL_COMMUNITY): Payer: Self-pay | Admitting: *Deleted

## 2011-08-12 ENCOUNTER — Inpatient Hospital Stay (HOSPITAL_COMMUNITY)
Admission: AD | Admit: 2011-08-12 | Discharge: 2011-08-12 | Disposition: A | Discharge status: Home / Self Care | Payer: BC Managed Care – PPO | Source: Ambulatory Visit | Attending: Obstetrics & Gynecology | Admitting: Obstetrics & Gynecology

## 2011-08-12 DIAGNOSIS — N925 Other specified irregular menstruation: Secondary | ICD-10-CM | POA: Insufficient documentation

## 2011-08-12 DIAGNOSIS — N939 Abnormal uterine and vaginal bleeding, unspecified: Secondary | ICD-10-CM

## 2011-08-12 DIAGNOSIS — R110 Nausea: Secondary | ICD-10-CM | POA: Insufficient documentation

## 2011-08-12 DIAGNOSIS — N949 Unspecified condition associated with female genital organs and menstrual cycle: Secondary | ICD-10-CM | POA: Insufficient documentation

## 2011-08-12 DIAGNOSIS — N898 Other specified noninflammatory disorders of vagina: Secondary | ICD-10-CM

## 2011-08-12 DIAGNOSIS — N938 Other specified abnormal uterine and vaginal bleeding: Secondary | ICD-10-CM | POA: Insufficient documentation

## 2011-08-12 LAB — CBC
MCH: 30.4 pg (ref 26.0–34.0)
MCHC: 33.6 g/dL (ref 30.0–36.0)
MCV: 90.4 fL (ref 78.0–100.0)
Platelets: 304 10*3/uL (ref 150–400)
RDW: 12.7 % (ref 11.5–15.5)

## 2011-08-12 MED ORDER — ONDANSETRON 8 MG PO TBDP: 8.0000 mg | ORAL_TABLET | Freq: Three times a day (TID) | ORAL | Status: AC | PRN

## 2011-08-12 NOTE — MAU Note (Signed)
Pt states bleeding has gotten worse since Saturday, after pt was discharged. And bleeding became worse.

## 2011-08-12 NOTE — Discharge Instructions (Signed)
Your hormone level is still pending.  We can call you with the results Plan to keep the follow up appointment you have in [place]. Your hemoglobin is normal. Get your prescription filled and take as needed for nausea.  It may cause constipation. Your blood pressure and pulse are stable.

## 2011-08-12 NOTE — MAU Note (Signed)
Patient states she had a confirmed SAB on 3-30 in MAU. States she continues to have bleeding, changing a pad every 2 hours. Minimal abdominal cramping.

## 2011-08-12 NOTE — MAU Provider Note (Signed)
History     CSN: [id number]  Arrival date and time: 08/12/11 1326   First Provider Initiated Contact with Patient 08/12/11 1555      Chief Complaint  Patient presents with  . Vaginal Bleeding   HPI [name] 26 y.o. Was at work today and was feeling weak, dizzy, and tired.  Thinks she is bleeding too much and wants the bleeding to stop.  Sent from work to be checked.  Having vaginal bleeding and nausea.  Had TAB in [place] on 08-07-11.  Was seen in MAU on 08-08-11.  Was to return for Quant on Saturday.  OB History    Grav Para Term Preterm Abortions TAB SAB Ect Mult Living   3 0 0 0 2 2 0 0 0 0       Past Medical History  Diagnosis Date  . No pertinent past medical history   . Ovarian cyst     Past Surgical History  Procedure Date  . Dilation and curettage of uterus   . Laporocscopy     Family History  Problem Relation Age of Onset  . Anesthesia problems Neg Hx   . Hypotension Neg Hx   . Malignant hyperthermia Neg Hx   . Pseudochol deficiency Neg Hx   . Hypertension Mother   . Hypertension Father     History  Substance Use Topics  . Smoking status: Current Some Day Smoker  . Smokeless tobacco: Never Used  . Alcohol Use: No    Allergies:  Allergies  Allergen Reactions  . Morphine And Related Itching    Lower doses OK  . Penicillins Other (See Comments)    Childhood reaction ( unknown)    Prescriptions prior to admission  Medication Sig Dispense Refill  . HYDROcodone-acetaminophen (VICODIN) 5-500 MG per tablet Take 1 tablet by mouth once. For pain. Only took one because it did not work.        Review of Systems  Constitutional: Positive for malaise/fatigue. Negative for fever.  Gastrointestinal: Positive for nausea. Negative for abdominal pain.  Genitourinary:       Vaginal bleeding  Neurological: Positive for dizziness and weakness.   Physical Exam   Blood pressure 121/73, pulse 72, temperature 98.5 F (36.9 C), temperature source Oral,  resp. rate 16, height 5\' 9"  (1.753 m), weight 165 lb (74.844 kg), last menstrual period 07/02/2011, SpO2 100.00%.  Physical Exam  Nursing note and vitals reviewed. Constitutional: She is oriented to person, place, and time. She appears well-developed and well-nourished.  HENT:  Head: Normocephalic.  Eyes: EOM are normal.  Neck: Neck supple.  GI: Soft.  Genitourinary:       Minimal dark blood on pad  Musculoskeletal: Normal range of motion.  Neurological: She is alert and oriented to person, place, and time.  Skin: Skin is warm and dry.  Psychiatric: She has a normal mood and affect.    MAU Course  Procedures Results for orders placed during the hospital encounter of 08/12/11 (from the past 24 hour(s))  CBC     Status: Normal   Collection Time   08/12/11  4:00 PM      Component Value Range   WBC 8.2  4.0 - 10.5 (K/uL)   RBC 4.05  3.87 - 5.11 (MIL/uL)   Hemoglobin 12.3  12.0 - 15.0 (g/dL)   HCT 84.6  96.2 - 95.2 (%)   MCV 90.4  78.0 - 100.0 (fL)   MCH 30.4  26.0 - 34.0 (pg)   MCHC 33.6  30.0 - 36.0 (g/dL)   RDW 16.1  09.6 - 04.5 (%)   Platelets 304  150 - 400 (K/uL)  HCG, QUANTITATIVE, PREGNANCY     Status: Abnormal   Collection Time   08/12/11  4:00 PM      Component Value Range   hCG, Beta Chain, Quant, S 488 (*) <5 (mIU/mL)   MDM Quant is pending.  Client unable to stay for results.  Needs to pick up child.  Hgb is normal.  Bleeding is no worse than on 08-08-11,  Will give Zofran for nausea and note to be out of work until Saturday.  Gave diet recall today of cinnamon bun, yogurt, soup and chicken sandwich.  States she is unable to take Phenergan and work.  Still having nausea.  Orthostatic vitals signs stable.    Assessment and Plan  Vaginal bleeding post TAB Nausea  Plan Will review quant when results are available Plan to keep follow up visit as scheduled in [place] for post TAB care Rx zofran 8 mg ODT bid PRN for nausea. Note for work given. 1723 Called client  after discharge to review quant results. Given that quant has dropped significantly, will not need to return on Saturday and advised to keep appointment in [place].  Client voices understanding.    [name] 08/12/2011, 4:27 PM

## 2011-08-15 ENCOUNTER — Inpatient Hospital Stay (HOSPITAL_COMMUNITY): Admission: RE | Admit: 2011-08-15 | Payer: No Typology Code available for payment source | Source: Ambulatory Visit

## 2011-11-29 DIAGNOSIS — R109 Unspecified abdominal pain: Secondary | ICD-10-CM | POA: Insufficient documentation

## 2011-11-29 DIAGNOSIS — N898 Other specified noninflammatory disorders of vagina: Secondary | ICD-10-CM | POA: Insufficient documentation

## 2011-11-30 ENCOUNTER — Emergency Department (HOSPITAL_BASED_OUTPATIENT_CLINIC_OR_DEPARTMENT_OTHER)
Admission: EM | Admit: 2011-11-30 | Discharge: 2011-11-30 | Discharge status: Against Medical Advice | Payer: BC Managed Care – PPO | Attending: Emergency Medicine | Admitting: Emergency Medicine

## 2011-12-01 DIAGNOSIS — F17200 Nicotine dependence, unspecified, uncomplicated: Secondary | ICD-10-CM | POA: Insufficient documentation

## 2011-12-01 DIAGNOSIS — Z0000 Encounter for general adult medical examination without abnormal findings: Secondary | ICD-10-CM | POA: Insufficient documentation

## 2011-12-01 DIAGNOSIS — Z8249 Family history of ischemic heart disease and other diseases of the circulatory system: Secondary | ICD-10-CM | POA: Insufficient documentation

## 2011-12-01 NOTE — ED Notes (Signed)
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

## 2011-12-02 ENCOUNTER — Encounter (HOSPITAL_BASED_OUTPATIENT_CLINIC_OR_DEPARTMENT_OTHER): Payer: Self-pay | Admitting: Emergency Medicine

## 2011-12-02 ENCOUNTER — Emergency Department (HOSPITAL_BASED_OUTPATIENT_CLINIC_OR_DEPARTMENT_OTHER)
Admission: EM | Admit: 2011-12-02 | Discharge: 2011-12-02 | Disposition: A | Discharge status: Home / Self Care | Payer: BC Managed Care – PPO | Attending: Emergency Medicine | Admitting: Emergency Medicine

## 2011-12-02 DIAGNOSIS — Z0000 Encounter for general adult medical examination without abnormal findings: Secondary | ICD-10-CM

## 2011-12-02 NOTE — ED Provider Notes (Signed)
History     CSN: [id number]  Arrival date & time 12/01/11  2350   First MD Initiated Contact with Patient 12/02/11 0012      Chief Complaint  Patient presents with  . 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 Pt has not complaints. Has completed the course before without difficulty. No chest pain or SOB with exercise. Only family history is of HTN. No family history of sudden cardiac death Past Medical History  Diagnosis Date  . No pertinent past medical history   . Ovarian cyst     Past Surgical History  Procedure Date  . Dilation and curettage of uterus   . Laporocscopy     Family History  Problem Relation Age of Onset  . Anesthesia problems Neg Hx   . Hypotension Neg Hx   . Malignant hyperthermia Neg Hx   . Pseudochol deficiency Neg Hx   . Hypertension Mother   . Hypertension Father     History  Substance Use Topics  . Smoking status: Current Some Day Smoker -- 0.5 packs/day  . Smokeless tobacco: Never Used  . Alcohol Use: No    OB History    Grav Para Term Preterm Abortions TAB SAB Ect Mult Living   3 0 0 0 2 2 0 0 0 0       Review of Systems  Respiratory: Negative for shortness of breath.   Cardiovascular: Negative for chest pain.  Neurological: Negative for dizziness, seizures, weakness, numbness and headaches.    Allergies  Morphine and related and Penicillins  Home Medications   Current Outpatient Rx  Name Route Sig Dispense Refill  . ADULT MULTIVITAMIN W/MINERALS CH Oral Take 1 tablet by mouth daily.      BP 130/66  Pulse 68  Temp 98 F (36.7 C) (Oral)  Resp 16  Ht 5\' 9"  (1.753 m)  Wt 158 lb (71.668 kg)  BMI 23.33 kg/m2  SpO2 100%  LMP 10/10/2011  Breastfeeding? Unknown  Physical Exam  Nursing note and vitals reviewed. Constitutional: She is oriented to person,  place, and time. She appears well-developed and well-nourished. No distress.  HENT:  Head: Normocephalic and atraumatic.  Mouth/Throat: Oropharynx is clear and moist. No oropharyngeal exudate.       Enlarged tonsils  Eyes: EOM are normal. Pupils are equal, round, and reactive to light.  Neck: Normal range of motion. Neck supple. No tracheal deviation present.  Cardiovascular: Normal rate, regular rhythm and normal heart sounds.  Exam reveals no gallop and no friction rub.   No murmur heard. Pulmonary/Chest: Effort normal and breath sounds normal. No stridor. No respiratory distress. She has no wheezes. She has no rales. She exhibits no tenderness.  Abdominal: Soft. Bowel sounds are normal. She exhibits no distension and no mass. There is no tenderness. There is no rebound and no guarding.  Musculoskeletal: Normal range of motion. She exhibits no edema and no tenderness.  Neurological: She is alert and oriented to person, place, and time.       5/5 motor, sensation intact, normal gait  Skin: Skin is warm and dry. No rash noted. She is not diaphoretic. No erythema.  Psychiatric: She has a normal mood and affect. Her behavior is normal.    ED Course  Procedures (including critical care time)  Labs Reviewed - No data to display No results found.   1. Routine history and physical examination of  adult       MDM  No concerning findings on history or physical exam to prevent pt from completing obstacle course        Loren Racer, MD 12/02/11 [phone]

## 2011-12-10 DIAGNOSIS — Z720 Tobacco use: Secondary | ICD-10-CM | POA: Insufficient documentation

## 2012-04-22 ENCOUNTER — Inpatient Hospital Stay (HOSPITAL_COMMUNITY)
Admission: AD | Admit: 2012-04-22 | Discharge: 2012-04-22 | Disposition: A | Discharge status: Home / Self Care | Payer: BC Managed Care – PPO | Source: Ambulatory Visit | Attending: Obstetrics and Gynecology | Admitting: Obstetrics and Gynecology

## 2012-04-22 ENCOUNTER — Encounter (HOSPITAL_COMMUNITY): Payer: Self-pay | Admitting: *Deleted

## 2012-04-22 DIAGNOSIS — R1032 Left lower quadrant pain: Secondary | ICD-10-CM | POA: Insufficient documentation

## 2012-04-22 DIAGNOSIS — M79609 Pain in unspecified limb: Secondary | ICD-10-CM | POA: Insufficient documentation

## 2012-04-22 DIAGNOSIS — R51 Headache: Secondary | ICD-10-CM | POA: Insufficient documentation

## 2012-04-22 MED ORDER — IBUPROFEN 800 MG PO TABS
800.0000 mg | ORAL_TABLET | Freq: Once | ORAL | Status: AC
  Administered 2012-04-22: 800 mg via ORAL
  Filled 2012-04-22: qty 1

## 2012-04-22 NOTE — MAU Provider Note (Signed)
History     Chief Complaint  Patient presents with  . Leg Pain  . Abdominal Pain   26 yo P0 SBF presents with c/o leg pain and LLQ pain. Pt also c/o vaginal spotting. Pt  Has nexplanon. She was seen in office 12/5 and c/o left sided pain at that time. She had a pelvic exam and STD screen  and underwent sonogram which was normal. The pain is unchanged. Pt also c/o headache for several days but only took tylenol 500 mg x 1. Pt c/o leg pain starting today. Pt wore her high heels in to ER today  OB History    Grav Para Term Preterm Abortions TAB SAB Ect Mult Living   4 0 0 0 3 3 0 0 0 0       Past Medical History  Diagnosis Date  . No pertinent past medical history   . Ovarian cyst     Past Surgical History  Procedure Date  . Dilation and curettage of uterus   . Laporocscopy     Family History  Problem Relation Age of Onset  . Anesthesia problems Neg Hx   . Hypotension Neg Hx   . Malignant hyperthermia Neg Hx   . Pseudochol deficiency Neg Hx   . Hypertension Mother   . Hypertension Father     History  Substance Use Topics  . Smoking status: Current Some Day Smoker -- 0.5 packs/day  . Smokeless tobacco: Never Used  . Alcohol Use: No    Allergies:  Allergies  Allergen Reactions  . Morphine And Related Itching    Lower doses OK  . Penicillins Other (See Comments)    Childhood reaction ( unknown)    Prescriptions prior to admission  Medication Sig Dispense Refill  . acetaminophen (TYLENOL) 500 MG tablet Take 500 mg by mouth once.      . etonogestrel (NEXPLANON) 68 MG IMPL implant Inject 1 each into the skin once. Pt had it implanted 09/2011, in right arm      . Norethin-Eth Estrad-Fe Biphas (LO LOESTRIN FE PO) Take 1 tablet by mouth 2 (two) times daily. Pt started taking a couple of weeks ago (04/10/12). Started with 1 tablet qid for several days, then 1 tablet tid for several days and is currently taking 1 tablet bid         Physical Exam   Blood pressure 115/61,  pulse 86, temperature 97.1 F (36.2 C), temperature source Oral, resp. rate 16, height 5\' 9"  (1.753 m), weight 72.576 kg (160 lb), last menstrual period 07/02/2011, not currently breastfeeding.  General appearance: alert, cooperative and no distress Abdomen: soft nondistended mild tender LLQ no rebound Extremities: no edema. (+) tender medial left calf ED Course   LLQ pain  Previously evaluated on office 12/5 w/ nl pelvic sonogram Left leg pain P) dooplers left leg MDM   Gweneth Fredlund A, MD 4:37 PM 04/22/2012      addendum. Nl dopplers. D/c home. F/u PCP for llq pain

## 2012-04-22 NOTE — Progress Notes (Signed)
VASCULAR LAB PRELIMINARY  PRELIMINARY  PRELIMINARY  PRELIMINARY  Left lower extremity venous duplex completed.    Preliminary report:  Left:  No evidence of DVT, superficial thrombosis, or Baker's cyst.  Jinger Middlesworth, RVS 04/22/2012, 5:55 PM

## 2012-04-22 NOTE — MAU Note (Signed)
C/o abdominal pain and leg pain for past 3 days; had an normal u/s in Dr [employer] office  on Thursday; negative UPT in office;

## 2012-05-17 ENCOUNTER — Encounter: Payer: Self-pay | Admitting: Obstetrics and Gynecology

## 2012-05-18 ENCOUNTER — Encounter: Payer: Self-pay | Admitting: Obstetrics and Gynecology

## 2014-03-12 ENCOUNTER — Encounter (HOSPITAL_COMMUNITY): Payer: Self-pay | Admitting: *Deleted

## 2015-05-12 HISTORY — PX: LUMBAR FUSION: SHX111

## 2015-09-04 ENCOUNTER — Inpatient Hospital Stay
Payer: No Typology Code available for payment source | Attending: Orthopaedic Surgery | Admitting: Rehabilitative and Restorative Service Providers"

## 2015-09-04 ENCOUNTER — Encounter: Payer: Self-pay | Admitting: Rehabilitative and Restorative Service Providers"

## 2015-09-04 VITALS — BP 136/92 | HR 114

## 2015-09-04 DIAGNOSIS — M5442 Lumbago with sciatica, left side: Secondary | ICD-10-CM | POA: Insufficient documentation

## 2015-09-04 DIAGNOSIS — M5416 Radiculopathy, lumbar region: Secondary | ICD-10-CM

## 2015-09-04 DIAGNOSIS — M5440 Lumbago with sciatica, unspecified side: Secondary | ICD-10-CM | POA: Insufficient documentation

## 2015-09-04 NOTE — PT/OT Exercise Plan (Signed)
Name: Alyssa Rowe  Referring Physician: Denton Ar, MD PHD  Diagnosis:     ICD-10-CM    1. Acute left-sided low back pain with left-sided sciatica M54.42    2. Lumbar radiculopathy M54.16         Precautions:  Hx of lumbar discectomy and fusion (L4-S1) Date of Surgery:    MD Follow-up: 10/01/15          Exercise Flow Sheet    Exercise Specifics 09/04/2015               TM               Prone lying/prone on elbows               PPT with marches/SLR                 Bridges                 Prone UE/LE lifts                 clams               Supermans                 Planks                                  Manual               MOC   No traction              Home Exercise Program                 (Initials = supervised exercise by clinician)    Plan Of Care: Body Mechanics Education, NMR, Proprioceptive Activites, Electrical Stimulation, Instruction in HEP, Ultrasound, Therapeutic Exercise, Balance/Gait training, Soft Tissue/Joint Mobilization STM and MFR of lumbosacral spine and Other: taping    Frequency/Duration: 2 times a week for 12 sessions. Certification Status Ends: 11/09/2015    Goals:  Date (Body Area, Impairment Goal, Functional   Activity, Target Performance) Time Frame Status Date/  Initial   09/04/2015   Patient will demonstrate independence in prescribed HEP with proper form, sets and reps for safe discharge to an independent program.  12 sessions Initial Eval    09/04/2015   Demonstrate proper posture and body mechanics with sitting so patient can sit > 2 hours.  12 sessions Initial Eval    09/04/2015   Decrease Oswestry Low Back Pain Questionnaire to 30% to exceed Minimal Detectable Change (MDC) of 10 points.  12 sessions Initial Eval    09/04/2015   Patient is able to achieve prone plank hold for 30 seconds to demonstrate improved core stabilization for reduced LBP with prolonged standing and walking > 1 hr  12 sessions Initial Eval    09/04/2015   Reduced L piriformis spasm to be able to maintain  pelvic alignment and reduce neural tension for centralization of pain to LB only  12 sessions Initial Eval      Signature: [name]. [place], PT, DPT [place] # [phone] Date: 09/04/2015    Signature: Denton Ar, MD PHD _______________________  Date:  ________________     Patient Name: Alyssa Rowe  MRN: [id number]

## 2015-09-04 NOTE — PT/OT Therapy Note (Signed)
INITIAL EVALUATION (Lumbar)    Name: Alyssa Rowe Age: 30 y.o. 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 Acute left-sided low back pain with left-sided sciatica M54.42    2. Lumbar radiculopathy M54.16         SUBJECTIVE:    Mechanism of Injury: Patient reports LBP and L radicular pain in 2014.  She had lumbar microdiscectomy in 2014 with only a couple months of symptom relief.  Patient states that she had lumbar L5-S1 discectomy and lumbar fusion in 2015.  She states that she had been symptom free until 2 months ago.  She states that there was no known trigger.  Lumbar MRI (see below).  Patient has not had injections but started acupuncture last Wednesday.  She states that it's helping with symptom management.    Patient's reason for seeking PT /Functional Limitations (PLOF): Difficulty sitting for more than 30-60 minutes; static standing for more than 30-60 minutes and walking more than 30 minutes; (Patient states that she had been symptom relief from surgery in 2015 until 2 months ago and did not have mobility or positioning restrictions)    Past Medical History:   Past Medical History   Diagnosis Date   . Asthma    . Low back pain      Medications: .  CYCLOBENZAPRINE HCL PO  .  IBUPROFEN PO   Fall Risks: Medium, History of fall(s)    Other Treatment/Prior Therapy: Yes acupuncture x 2 sessions to date  Prior Hospitalization: No  Hand Dominance: Dominant Hand: Right Involved Side: Involved Side: Left   DiagnosticTests: lumbar MRI (slight translation of spacer and L L2 3 mm disc protrusion)    Outcome Measure:   Oswestry Score: 48                                      % Pain Score: 50% Rate Satisfaction with Current Function: 6/10   Living Environment: Type  of Residence: One story home/apartment      Dwelling Entrance: # Steps to Enter: 15   Patient lives with: Living Arrangements: Spouse/significant other    OBJECTIVE:    Vitals: BP: (!) 136/92 mmHg Heart Rate: (!) 114 Comments: taken in L UE while seated    Observation/Posture/Gait/Integumentary  Observation: Patient is a pleasant 30 y.o female in no signs of acute distress  Posture: Deficits noted: Pes Planus bilateral; Pelvic asymmetry in sitting/standing  Gait: Within functional limits  Integumentary: No wound, lesion or rash noted    AROM: Lumbar Spine   Initial      Flexion 80      Extension 22       R L R L   Side Bending 32 32 increased L LE numbness     (blank fields were intentionally left blank)    Repeated flexion/extension centralizes symptoms? No    Initial R   R LE Strength  MMT /5 Initial  L   L   4+  Hip Flexion(L1/2) 4    5  Hip Adduction 5    5  Hip Abduction 5    5  Knee Flexion (S1) 5    5  Knee Extension (L3) 5    5  Ankle DF (L4) 5    5  Ankle PF 5    (blank fields were intentionally left blank)      Functional Strength:   Sit to Stand: Independent    Palpation: Pain to palpation: lower lumbar spine     Joint Mobility Assessment: hypermobile upper lumbar spine     Sensation to Light touch: Intact    Special Tests:  Normal B HS flexibility:  90 deg SLR bilat  Normal gastroc flexibility  B hip PROM WFL  Negative SLR Test  L piriformis spasm (reduced prone hip IR)    Treatment Initial Visit:  Evaluation    Therapeutic Activity: Patient education on role of PT, POC, SI dysfunction pathology vs HNP. Discussed importance of foot foot support for standing during vocational demands and recreation- referred patient to [facility] for assessment for new tennis shoes.  Also mentioned [employer] for new summer sandals.  Recommended orthotics for B feet to insert in current shoes that are in good shape  Manual: 3 plane sacrum and L piriformis S-CS; DTM to L gluteal  Rehab  Potential:good  Is patient aware of diagnosis: Yes  For Next Visit Add Core stabilization program and reassess alignment    [name]. Tilman Neat, DPT [place] # [phone]  09/04/2015      Total Treatment (billable) Time:  40     Total Timed Minutes:  25    Plan of Care IPTC Medicare Provider #: [phone]                Patient Name: Alyssa Rowe  MRN: [id number]  DOI: Onset of Problem / Injury: 07/07/15 DOS: N/A  SOC:09/04/2015    Diagnosis:     ICD-10-CM    1. Acute left-sided low back pain with left-sided sciatica M54.42    2. Lumbar radiculopathy M54.16        ASSESSMENT: the patient is a 30 y.o. 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    Pain located: LB and L posterior leg    Clinical presentation: evolving due to reoccurrence of symptoms 2 months ago    Functional Limitations (PLOF): Patient states that she had been symptom relief from surgery in 2015 until 2 months ago and did not have mobility or positioning restrictions)    Plan Of Care: Body Mechanics Education, NMR, Proprioceptive Activites, Electrical Stimulation, Instruction in HEP, Ultrasound, Therapeutic Exercise, Balance/Gait training, Soft Tissue/Joint Mobilization STM and MFR of lumbosacral spine and Other: taping    Frequency/Duration: 2 times a week for 12 sessions. Certification Status Ends: 11/09/2015    Goals:  Date (Body Area, Impairment Goal, Functional   Activity, Target Performance) Time Frame Status Date/  Initial   09/04/2015   Patient will demonstrate independence in prescribed HEP with proper form, sets and reps for safe discharge to an independent program.  12 sessions Initial Eval    09/04/2015    Demonstrate proper posture and body mechanics with sitting so patient can sit > 2 hours.  12 sessions Initial Eval    09/04/2015   Decrease Oswestry Low Back Pain Questionnaire to 30% to exceed Minimal  Detectable Change (MDC) of 10 points.  12 sessions Initial Eval    09/04/2015   Patient is able to achieve prone plank hold for 30 seconds to demonstrate improved core stabilization for reduced LBP with prolonged standing and walking > 1 hr  12 sessions Initial Eval    09/04/2015   Reduced L piriformis spasm to be able to maintain pelvic alignment and reduce neural tension for centralization of pain to LB only  12 sessions Initial Eval      Signature: [name]. [place], PT, DPT [place] # [phone] Date: 09/04/2015    Signature: Denton Ar, MD PHD _______________________  Date:  ________________     Patient Name: Alyssa Rowe  MRN: [id number]

## 2015-09-04 NOTE — PT/OT Plan of Care (Signed)
Plan of Care IPTC Medicare Provider #: [phone]                Patient Name: Alyssa Rowe  MRN: [id number]  DOI: Onset of Problem / Injury: 07/07/15 DOS: N/A  SOC:09/04/2015    Diagnosis:     ICD-10-CM    1. Acute left-sided low back pain with left-sided sciatica M54.42    2. Lumbar radiculopathy M54.16        ASSESSMENT: the patient is a 30 y.o. 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    Pain located: LB and L posterior leg    Clinical presentation: evolving due to reoccurrence of symptoms 2 months ago    Functional Limitations (PLOF): Patient states that she had been symptom relief from surgery in 2015 until 2 months ago and did not have mobility or positioning restrictions)    Plan Of Care: Body Mechanics Education, NMR, Proprioceptive Activites, Electrical Stimulation, Instruction in HEP, Ultrasound, Therapeutic Exercise, Balance/Gait training, Soft Tissue/Joint Mobilization STM and MFR of lumbosacral spine and Other: taping    Frequency/Duration: 2 times a week for 12 sessions. Certification Status Ends: 11/09/2015    Goals:  Date (Body Area, Impairment Goal, Functional   Activity, Target Performance) Time Frame Status Date/  Initial   09/04/2015   Patient will demonstrate independence in prescribed HEP with proper form, sets and reps for safe discharge to an independent program.  12 sessions Initial Eval    09/04/2015   Demonstrate proper posture and body mechanics with sitting so patient can sit > 2 hours.  12 sessions Initial Eval    09/04/2015   Decrease Oswestry Low Back Pain Questionnaire to 30% to exceed Minimal Detectable Change (MDC) of 10 points.  12  sessions Initial Eval    09/04/2015   Patient is able to achieve prone plank hold for 30 seconds to demonstrate improved core stabilization for reduced LBP with prolonged standing and walking > 1 hr  12 sessions Initial Eval    09/04/2015   Reduced L piriformis spasm to be able to maintain pelvic alignment and reduce neural tension for centralization of pain to LB only  12 sessions Initial Eval      Signature: [name]. [place], PT, DPT [place] # [phone] Date: 09/04/2015    Signature: Denton Ar, MD PHD _______________________  Date:  ________________     Patient Name: Alyssa Rowe  MRN: [id number]

## 2015-09-06 ENCOUNTER — Inpatient Hospital Stay: Payer: No Typology Code available for payment source | Attending: Orthopaedic Surgery

## 2015-09-06 DIAGNOSIS — M5416 Radiculopathy, lumbar region: Secondary | ICD-10-CM | POA: Insufficient documentation

## 2015-09-06 DIAGNOSIS — M5442 Lumbago with sciatica, left side: Secondary | ICD-10-CM | POA: Insufficient documentation

## 2015-09-06 NOTE — PT/OT Exercise Plan (Signed)
Name: Alyssa Rowe  Referring Physician: Denton Ar, MD PHD  Diagnosis:     ICD-10-CM    1. Acute left-sided low back pain with left-sided sciatica M54.42    2. Lumbar radiculopathy M54.16         Precautions:  Hx of lumbar discectomy and fusion (L4-S1) Date of Surgery:    MD Follow-up: 10/01/15          Exercise Flow Sheet    Exercise Specifics 09/04/2015 09/06/15              TM   6'  AD            Prone lying/prone on elbows   Prone laying 1' no pillow AD    1' POEs with 1 pillow under hips  AD    Prone laying with 1 pillow 1'  AD    POEs with 1 pillow under hips 1' AD            PPT with marches/SLR     NT            Bridges     When checking alignment , verbal instruction for home AD            Prone UE/LE lifts     UEs 10x each with 1 pillow under hips AD            clams   NT            Supermans     NT            Planks     NT                             Manual   See note AD            MOC   No traction  MH/ES  AD            Home Exercise Program     Verbal bridges and PPT for home  AD Give copies of HEP  From last visit  AD           (Initials = supervised exercise by clinician)    Plan Of Care: Body Mechanics Education, NMR, Proprioceptive Activites, Electrical Stimulation, Instruction in HEP, Ultrasound, Therapeutic Exercise, Balance/Gait training, Soft Tissue/Joint Mobilization STM and MFR of lumbosacral spine and Other: taping    Frequency/Duration: 2 times a week for 12 sessions. Certification Status Ends: 11/09/2015    Goals:  Date (Body Area, Impairment Goal, Functional   Activity, Target Performance) Time Frame Status Date/  Initial   09/04/2015   Patient will demonstrate independence in prescribed HEP with proper form, sets and reps for safe discharge to an independent program.  12 sessions Initial Eval    09/04/2015   Demonstrate proper posture and body mechanics with sitting so patient can sit > 2 hours.  12 sessions Initial Eval    09/04/2015   Decrease Oswestry Low Back Pain Questionnaire to  30% to exceed Minimal Detectable Change (MDC) of 10 points.  12 sessions Initial Eval    09/04/2015   Patient is able to achieve prone plank hold for 30 seconds to demonstrate improved core stabilization for reduced LBP with prolonged standing and walking > 1 hr  12 sessions Initial Eval    09/04/2015   Reduced L piriformis spasm to be able to maintain pelvic alignment and reduce neural tension for  centralization of pain to LB only  12 sessions Initial Eval      Signature: [name]. [place], PT, DPT  # [phone] Date: 09/04/2015    Signature: Denton Ar, MD PHD _______________________  Date:  ________________     Patient Name: Alyssa Rowe  MRN: [id number]

## 2015-09-06 NOTE — PT/OT Therapy Note (Signed)
DAILY NOTE   09/06/2015        Total Treatment (billable)Time: 55'   Total Timed Minutes:  56'  Visit Number:  2      Payor: Advertising copywriter / Plan: [employer] [phone] PO [id number] NON OPTIONS / Product Type: *No Product type* /    # of Authorized Visits: 24 Visit #: 1      Diagnosis (Treating/Medical):     ICD-10-CM    1. Acute left-sided low back pain with left-sided sciatica M54.42    2. Lumbar radiculopathy M54.16          Subjective:  Keiva states she had to drive down to [facility]. Yesterday 3 hrs. There and 3 hrs.  Back.  I had my dog with me, so I stopped midway through treatment and walked around.  I have no problem with walking.  I was feeling the pain while driving and down there, but I pushed through. I feel it on my L side.  Functional Status: none.    Objective:   Treatment:  Therapeutic Exercise: to improve: Flexibility/ROM, Stabilization and Strength   Warm-up:  TM x 6' with subjective  Modifications/Patient Education: initiated exercises - see flow sheet.  Discussed LLD and told patient I will discuss it with her evaluating therapist as well as what was seen today.  Mentioned a possible heel lift and how back pain can contribute to pelvis asymmetry. Verbal instruction to do PPT seated or supine at home and bridges.      Manual Therapy:   Bilateral SKTC, piriformis, and HS stretching  Check pelvis alignment - noticeable L LE shorter supine  Tried STM to L LB prone with 1 pillow under hips, then rechecked with only slight improvement with alignment.  Tried L hip extension and R hip flexion with no improvement      Initial Evaluation Reference and/orCurrent Measurements (ROM, Strength, Girth, Outcomes, etc.):      Pelvis asymmetry - see above (not corrected with STM or METs)    Modalities: Electrical Stimulation with Heat: Premod 15 min. Location bilateral LB Position Supine 90/90  Therapy Rationale: Decrease Pain, Decrease Spasm, Decrease Inflammation and Increase Extensiblility       Assessment (response to  treatment):   Good LE flexibility bilaterally, but very tight L LB with STM.  Slight improvement with LLD when rechecked after STM.  No change when performing METs with LLD.  Patient complained of pressure feeling laying prone without a pillow and with a pillow, but none at all with POEs with 1 pillow under her hips.    Progress towards functional goals: nt    Patient requires continued skilled care to: increase in core strength for less back pain.    Plan:  Continue with Plan of Care.  Give the copies of the HEP that were given verbally today.    Claria Dice, LPTA  09/06/2015

## 2015-09-09 ENCOUNTER — Inpatient Hospital Stay: Payer: No Typology Code available for payment source

## 2015-09-10 ENCOUNTER — Inpatient Hospital Stay: Payer: No Typology Code available for payment source | Attending: Orthopaedic Surgery

## 2015-09-10 DIAGNOSIS — M5442 Lumbago with sciatica, left side: Secondary | ICD-10-CM | POA: Insufficient documentation

## 2015-09-10 DIAGNOSIS — M5416 Radiculopathy, lumbar region: Secondary | ICD-10-CM | POA: Insufficient documentation

## 2015-09-10 NOTE — PT/OT Therapy Note (Signed)
DAILY NOTE   09/10/2015        Total Treatment (billable)Time: 60'   Total Timed Minutes:  14' Visit Number:  3      Payor: Advertising copywriter / Plan: [employer] [phone] PO [id number] NON OPTIONS / Product Type: *No Product type* /    # of Authorized Visits:   Visit #:        Diagnosis (Treating/Medical):     ICD-10-CM    1. Acute left-sided low back pain with left-sided sciatica M54.42    2. Lumbar radiculopathy M54.16          Subjective:  Bianca states she went to Acupuncture yesterday.  I think I might stop going for awhile. I don't think it's helping that much.  I feel a difference with PT.  Functional Status: none.    Objective:   Treatment:  Therapeutic Exercise: to improve: Flexibility/ROM, Stabilization and Strength   Warm-up:  TM x 6' with subjective  Modifications/Patient Education: added PPT, bridges, PPT with marching and SLR hip flexion, and S/L clams.  Patient given copies of HEP (Access Code [id number]).    NMR:  Core stabilization exercises performed: patient performed stabilization exercises to include PPT, PPT with marching, PPT with SLR, and prone alternating arm lifts in order to facilitate core musculature and decrease lower back pain.    Manual Therapy:   Bilateral SKTC, piriformis, and HS stretching  Checked pelvis alignment - L LE shorter, less than last visit  STM to L LB prone with 1 pillow under hips        Initial Evaluation Reference and/orCurrent Measurements (ROM, Strength, Girth, Outcomes, etc.):      Pelvis asymmetry - but less noticeable today with L LE shorter    Modalities: Electrical Stimulation with Heat: Premod 15 min. Location L LB  Position Recumbant  Therapy Rationale: Decrease Pain, Decrease Spasm, Decrease Inflammation and Increase Extensiblility       Assessment (response to treatment):   Tightness less today at L LB with STM. Patient did report less pain after manual therapy and exercises, prior to MH/ES.    Progress towards functional goals: see below.    Goals:  Date (Body Area,  Impairment Goal, Functional   Activity, Target Performance) Time Frame Status Date/  Initial   09/04/2015  Patient will demonstrate independence in prescribed HEP with proper form, sets and reps for safe discharge to an independent program. 12 sessions Initial Eval    09/04/2015  Demonstrate proper posture and body mechanics with sitting so patient can sit > 2 hours. 12 sessions Initial Eval    09/04/2015  Decrease Oswestry Low Back Pain Questionnaire to 30% to exceed Minimal Detectable Change (MDC) of 10 points. 12 sessions Initial Eval    09/04/2015  Patient is able to achieve prone plank hold for 30 seconds to demonstrate improved core stabilization for reduced LBP with prolonged standing and walking > 1 hr 12 sessions Initial Eval    09/04/2015  Reduced L piriformis spasm to be able to maintain pelvic alignment and reduce neural tension for centralization of pain to LB only 12 sessions Initial Eval    Working on - less of a LLD       09/10/15 AD              Patient requires continued skilled care to: increase in core strength for less LBP with ADLs.    Plan:  Continue with Plan of Care    Claria Dice, LPTA  09/10/2015

## 2015-09-10 NOTE — PT/OT Exercise Plan (Signed)
Name: Alyssa Rowe  Referring Physician: Denton Ar, MD PHD  Diagnosis:     ICD-10-CM    1. Acute left-sided low back pain with left-sided sciatica M54.42    2. Lumbar radiculopathy M54.16         Precautions:  Hx of lumbar discectomy and fusion (L4-S1) Date of Surgery:    MD Follow-up: 10/01/15          Exercise Flow Sheet    Exercise Specifics 09/04/2015 09/06/15 09/10/15             TM   6'  AD 6'  AD           Prone lying/prone on elbows   Prone laying 1' no pillow AD    1' POEs with 1 pillow under hips  AD    Prone laying with 1 pillow 1'  AD    POEs with 1 pillow under hips 1' AD See below AD           PPT with marches/SLR     NT PPT 5 sec. 10x AD    PPT with marches 20x AD    PPT with SLR 10x each AD            Bridges     When checking alignment , verbal instruction for home AD 10x AD           Prone UE/LE lifts     UEs 10x each with 1 pillow under hips AD 10x each AD           clams   NT 15x each AD           Supermans     NT NT           Planks     NT NT                            Manual   See note AD See note AD           MOC   No traction  MH/ES  AD MH/ES AD           Home Exercise Program     Verbal bridges and PPT for home  AD Gave copies   AD           (Initials = supervised exercise by clinician)    Plan Of Care: Body Mechanics Education, NMR, Proprioceptive Activites, Electrical Stimulation, Instruction in HEP, Ultrasound, Therapeutic Exercise, Balance/Gait training, Soft Tissue/Joint Mobilization STM and MFR of lumbosacral spine and Other: taping    Frequency/Duration: 2 times a week for 12 sessions. Certification Status Ends: 11/09/2015    Goals:  Date (Body Area, Impairment Goal, Functional   Activity, Target Performance) Time Frame Status Date/  Initial   09/04/2015   Patient will demonstrate independence in prescribed HEP with proper form, sets and reps for safe discharge to an independent program.  12 sessions Initial Eval    09/04/2015   Demonstrate proper posture and body mechanics with  sitting so patient can sit > 2 hours.  12 sessions Initial Eval    09/04/2015   Decrease Oswestry Low Back Pain Questionnaire to 30% to exceed Minimal Detectable Change (MDC) of 10 points.  12 sessions Initial Eval    09/04/2015   Patient is able to achieve prone plank hold for 30 seconds to demonstrate improved core stabilization for reduced LBP with prolonged standing and walking >  1 hr  12 sessions Initial Eval    09/04/2015   Reduced L piriformis spasm to be able to maintain pelvic alignment and reduce neural tension for centralization of pain to LB only  12 sessions Initial Eval      Signature: [name]. [place], PT, DPT [place] # [phone] Date: 09/04/2015    Signature: Denton Ar, MD PHD _______________________  Date:  ________________     Patient Name: Alyssa Rowe  MRN: [id number]

## 2015-09-12 ENCOUNTER — Inpatient Hospital Stay
Payer: No Typology Code available for payment source | Attending: Orthopaedic Surgery | Admitting: Rehabilitative and Restorative Service Providers"

## 2015-09-12 DIAGNOSIS — M5442 Lumbago with sciatica, left side: Secondary | ICD-10-CM | POA: Insufficient documentation

## 2015-09-12 DIAGNOSIS — M5416 Radiculopathy, lumbar region: Secondary | ICD-10-CM | POA: Insufficient documentation

## 2015-09-12 NOTE — PT/OT Therapy Note (Signed)
DAILY NOTE   09/12/2015        Total Treatment (billable)Time: 55   Total Timed Minutes:  40 Visit Number:  4    Payor: Advertising copywriter / Plan: [employer] [phone] PO [id number] NON OPTIONS / Product Type: *No Product type* /    # of Authorized Visits: 24 Visit #: 3      Diagnosis (Treating/Medical):     ICD-10-CM    1. Acute left-sided low back pain with left-sided sciatica M54.42    2. Lumbar radiculopathy M54.16            Subjective:  Alyssa Rowe's pain is Constant/continuous and is rated a 6/10.  Functional Status: She states that shes hurting today.  She states that the pain is also radiating down the L leg.    Objective:   Treatment:  Therapeutic Exercise: to improve: Flexibility/ROM   Warm-up:  On TM x 6'  Modifications/Patient Education: Reduced therex to focus on correcting alignment and reducing L Q/L and gluteal hypertonicity Verbal, Visual and Tactile cues for therex form    Manual Therapy:   3 plane sacrum and R piriformis S-CS  DTM to L Q/L and buttocks  Shotgun technique in hooklying      Initial Evaluation Reference and/orCurrent Measurements (ROM, Strength, Girth, Outcomes, etc.):   Pelvic asymmetry  R piriformis spasm  L upslip  Pubic asymmetry  Measured leg length and 2 cm difference noted (applied heel lift in L shoe)    Modalities: Electrical Stimulation with Heat: Premod 15 min. Location LB Position Supine 90/90  Therapy Rationale: Decrease Pain, Decrease Spasm, Decrease Inflammation, Decrease Edema and Increase Extensiblility       Assessment (response to treatment):   Patient has pelvic asymmetry and true leg length discrepancy.  Her L Q/L and gluteal hypertonicity are reduced after manual therapy but can be a source of increased pain.  Patient is able to complete L leg lengthener with appropriate form to self correct upslip.  Patient is given heel lift to trial to see if it reduces pain.    Progress towards functional goals: NT    Patient requires continued skilled care to: Resolve SI dysfunction to less  LBP    Plan:  Continue with Plan of Care    [name]. Delene Ruffini, PT, DPT [place] # [phone]  09/12/2015

## 2015-09-12 NOTE — PT/OT Exercise Plan (Signed)
Name: [name]  Referring Physician: Denton Ar, MD PHD  Diagnosis:     ICD-10-CM    1. Acute left-sided low back pain with left-sided sciatica M54.42    2. Lumbar radiculopathy M54.16         Precautions:  Hx of lumbar discectomy and fusion (L4-S1) Date of Surgery:    MD Follow-up: 10/01/15          Exercise Flow Sheet    Exercise Specifics 09/04/2015 09/06/15 09/10/15 09/12/15            TM   6'  AD 6'  AD 6'  LMS          Prone lying/prone on elbows   Prone laying 1' no pillow AD    1' POEs with 1 pillow under hips  AD    Prone laying with 1 pillow 1'  AD    POEs with 1 pillow under hips 1' AD See below AD NT          PPT with marches/SLR     NT PPT 5 sec. 10x AD    PPT with marches 20x AD    PPT with SLR 10x each AD  NT          Bridges     When checking alignment , verbal instruction for home AD 10x AD NT          Prone UE/LE lifts     UEs 10x each with 1 pillow under hips AD 10x each AD NT          clams   NT 15x each AD NT          Supermans     NT NT NT          Planks     NT NT NT                 Supine L leg lengthener  10 x 5"  LMS          Manual   See note AD See note AD See note  LMS          MOC   No traction  MH/ES  AD MH/ES AD MH/ES post tx x 15'  LMS          Home Exercise Program     Verbal bridges and PPT for home  AD Gave copies   AD           (Initials = supervised exercise by clinician)    Plan Of Care: Body Mechanics Education, NMR, Proprioceptive Activites, Electrical Stimulation, Instruction in HEP, Ultrasound, Therapeutic Exercise, Balance/Gait training, Soft Tissue/Joint Mobilization STM and MFR of lumbosacral spine and Other: taping    Frequency/Duration: 2 times a week for 12 sessions. Certification Status Ends: 11/09/2015    Goals:  Date (Body Area, Impairment Goal, Functional   Activity, Target Performance) Time Frame Status Date/  Initial   09/04/2015   Patient will demonstrate independence in prescribed HEP with proper form, sets and reps for safe discharge to an independent  program.  12 sessions Initial Eval    09/04/2015   Demonstrate proper posture and body mechanics with sitting so patient can sit > 2 hours.  12 sessions Initial Eval    09/04/2015   Decrease Oswestry Low Back Pain Questionnaire to 30% to exceed Minimal Detectable Change (MDC) of 10 points.  12 sessions Initial Eval    09/04/2015   Patient is able to achieve  prone plank hold for 30 seconds to demonstrate improved core stabilization for reduced LBP with prolonged standing and walking > 1 hr  12 sessions Initial Eval    09/04/2015   Reduced L piriformis spasm to be able to maintain pelvic alignment and reduce neural tension for centralization of pain to LB only  12 sessions Initial Eval      Signature: [name]. [place], PT, DPT [place] # [phone] Date: 09/04/2015    Signature: Denton Ar, MD PHD _______________________  Date:  ________________     Patient Name: Alyssa Rowe  MRN: [id number]

## 2015-09-16 ENCOUNTER — Inpatient Hospital Stay: Payer: No Typology Code available for payment source

## 2015-09-18 ENCOUNTER — Inpatient Hospital Stay
Payer: No Typology Code available for payment source | Attending: Orthopaedic Surgery | Admitting: Rehabilitative and Restorative Service Providers"

## 2015-09-18 DIAGNOSIS — M5416 Radiculopathy, lumbar region: Secondary | ICD-10-CM

## 2015-09-18 DIAGNOSIS — M5442 Lumbago with sciatica, left side: Secondary | ICD-10-CM | POA: Insufficient documentation

## 2015-09-18 NOTE — PT/OT Therapy Note (Signed)
DAILY NOTE   09/18/2015        Total Treatment (billable)Time: 50   Total Timed Minutes:  35 Visit Number:  5      Payor: Advertising copywriter / Plan: [employer] [phone] PO [id number] NON OPTIONS / Product Type: *No Product type* /    # of Authorized Visits: 24 Visit #: 3      Diagnosis (Treating/Medical):     ICD-10-CM    1. Acute left-sided low back pain with left-sided sciatica M54.42    2. Lumbar radiculopathy M54.16            Subjective:  Deisha's pain is Constant/continuous and is rated a 3/10.  Functional Status: Patient states the heel lift reduced pain by 70% since last visit.  She states that she feels pressure in her back today.  She states that she's mostly had LBP not leg pain since last session.    Objective:   Treatment:  Therapeutic Exercise: to improve: Flexibility/ROM, Stabilization and Strength   Warm-up:  On TM x 2.5 mph  Modifications/Patient Education: Resumed core stabilization Verbal, Visual and Tactile cues for therex form    NMR:  PPT and bridges to improve core stabilization for reduced lumbar lordosis and LB strain with prolonged positioning    Manual Therapy:   DTM to B Q/L and L gluteal      Initial Evaluation Reference and/orCurrent Measurements (ROM, Strength, Girth, Outcomes, etc.):   Neutral pelvic alignement, piriformis symmetry, negative upslip     Modalities: Electrical Stimulation with Heat: Premod 15 min. Location LB Position Supine 90/90  Therapy Rationale: Decrease Pain, Decrease Spasm, Decrease Inflammation, Decrease Edema and Increase Extensiblility       Assessment (response to treatment):   Patient has maintained all pelvic corrections since last visit and recommended to purchase heel lift today.  She demonstrates good form and control with NMR today and does not report pain.  Patient is also educated on proper sitting form with reduced lumbar lordosis and appropriate thoracic posture.    Progress towards functional goals: Maintained pelvic/piriformis symmetry    Patient requires  continued skilled care to: improve core stabilization for reduced pain and radicular symptoms with prolonged positioning    Plan:  Continue with Plan of Care    [name]. Delene Ruffini, PT, DPT [place] # [phone]  09/18/2015

## 2015-09-18 NOTE — PT/OT Exercise Plan (Signed)
Name: [name]  Referring Physician: Denton Ar, MD PHD  Diagnosis:     ICD-10-CM    1. Acute left-sided low back pain with left-sided sciatica M54.42    2. Lumbar radiculopathy M54.16         Precautions:  Hx of lumbar discectomy and fusion (L4-S1) Date of Surgery:    MD Follow-up: 10/01/15          Exercise Flow Sheet    Exercise Specifics 09/04/2015 09/06/15 09/10/15 09/12/15 09/18/2015           TM   6'  AD 6'  AD 6'  LMS 6'  2.5 mph  LMS         Prone lying/prone on elbows   Prone laying 1' no pillow AD    1' POEs with 1 pillow under hips  AD    Prone laying with 1 pillow 1'  AD    POEs with 1 pillow under hips 1' AD See below AD NT NT         PPT with marches/SLR     NT PPT 5 sec. 10x AD    PPT with marches 20x AD    PPT with SLR 10x each AD  NT PPT  20 x 5"  LMS  PPT with marches  20x  LMS         Bridges     When checking alignment , verbal instruction for home AD 10x AD NT Bridges with core stabilization  20 x 5"  LMS         Prone UE/LE lifts     UEs 10x each with 1 pillow under hips AD 10x each AD NT Prone hip ext  SLR  15 x ea LE  LMS         clams   NT 15x each AD NT NT         Supermans     NT NT NT NT         Planks     NT NT NT NT                Supine L leg lengthener  10 x 5"  LMS NT         Manual   See note AD See note AD See note  LMS See note  LMS           MOC   No traction  MH/ES  AD MH/ES AD MH/ES post tx x 15'  LMS MH/ES post tx x 15'  LMS         Home Exercise Program     Verbal bridges and PPT for home  AD Gave copies   AD           (Initials = supervised exercise by clinician)    Plan Of Care: Body Mechanics Education, NMR, Proprioceptive Activites, Electrical Stimulation, Instruction in HEP, Ultrasound, Therapeutic Exercise, Balance/Gait training, Soft Tissue/Joint Mobilization STM and MFR of lumbosacral spine and Other: taping    Frequency/Duration: 2 times a week for 12 sessions. Certification Status Ends: 11/09/2015    Goals:  Date (Body Area, Impairment Goal, Functional   Activity,  Target Performance) Time Frame Status Date/  Initial   09/04/2015   Patient will demonstrate independence in prescribed HEP with proper form, sets and reps for safe discharge to an independent program.  12 sessions Initial Eval    09/04/2015   Demonstrate proper posture and body mechanics with sitting so  patient can sit > 2 hours.  12 sessions Initial Eval    09/04/2015   Decrease Oswestry Low Back Pain Questionnaire to 30% to exceed Minimal Detectable Change (MDC) of 10 points.  12 sessions Initial Eval    09/04/2015   Patient is able to achieve prone plank hold for 30 seconds to demonstrate improved core stabilization for reduced LBP with prolonged standing and walking > 1 hr  12 sessions Initial Eval    09/04/2015   Reduced L piriformis spasm to be able to maintain pelvic alignment and reduce neural tension for centralization of pain to LB only  12 sessions Initial Eval      Signature: [name]. [place], PT, DPT [place] # [phone] Date: 09/04/2015    Signature: Denton Ar, MD PHD _______________________  Date:  ________________     Patient Name: Alyssa Rowe  MRN: [id number]

## 2015-09-23 ENCOUNTER — Inpatient Hospital Stay
Payer: No Typology Code available for payment source | Attending: Orthopaedic Surgery | Admitting: Rehabilitative and Restorative Service Providers"

## 2015-09-23 DIAGNOSIS — M5442 Lumbago with sciatica, left side: Secondary | ICD-10-CM | POA: Insufficient documentation

## 2015-09-23 DIAGNOSIS — M5416 Radiculopathy, lumbar region: Secondary | ICD-10-CM | POA: Insufficient documentation

## 2015-09-25 ENCOUNTER — Inpatient Hospital Stay: Payer: No Typology Code available for payment source | Admitting: Rehabilitative and Restorative Service Providers"

## 2015-09-25 NOTE — PT/OT Exercise Plan (Signed)
Name: Alyssa Rowe  Referring Physician: Denton Ar, MD PHD  Diagnosis:     ICD-10-CM    1. Acute left-sided low back pain with left-sided sciatica M54.42    2. Lumbar radiculopathy M54.16         Precautions:  Hx of lumbar discectomy and fusion (L4-S1) Date of Surgery:    MD Follow-up: 10/01/15          Exercise Flow Sheet    Exercise Specifics 09/04/2015 09/06/15 09/10/15 09/12/15 09/18/2015 09/25/15          TM   6'  AD 6'  AD 6'  LMS 6'  2.5 mph  LMS 6'        Prone lying/prone on elbows   Prone laying 1' no pillow AD    1' POEs with 1 pillow under hips  AD    Prone laying with 1 pillow 1'  AD    POEs with 1 pillow under hips 1' AD See below AD NT NT NT        PPT with marches/SLR     NT PPT 5 sec. 10x AD    PPT with marches 20x AD    PPT with SLR 10x each AD  NT PPT  20 x 5"  LMS  PPT with marches  20x  LMS NT        Bridges     When checking alignment , verbal instruction for home AD 10x AD NT Bridges with core stabilization  20 x 5"  LMS NT        Prone UE/LE lifts     UEs 10x each with 1 pillow under hips AD 10x each AD NT Prone hip ext  SLR  15 x ea LE  LMS NT        clams   NT 15x each AD NT NT NT        Supermans     NT NT NT NT NT        Planks     NT NT NT NT NT               Supine L leg lengthener  10 x 5"  LMS NT NT        Manual   See note AD See note AD See note  LMS See note  LMS   See note  LMS        MOC   No traction  MH/ES  AD MH/ES AD MH/ES post tx x 15'  LMS MH/ES post tx x 15'  LMS [place]   MH/ES post tx x 15'  LMS        Home Exercise Program     Verbal bridges and PPT for home  AD Gave copies   AD           (Initials = supervised exercise by clinician)    Plan Of Care: Body Mechanics Education, NMR, Proprioceptive Activites, Electrical Stimulation, Instruction in HEP, Ultrasound, Therapeutic Exercise, Balance/Gait training, Soft Tissue/Joint Mobilization STM and MFR of lumbosacral spine and Other: taping    Frequency/Duration: 2 times a week for 12 sessions. Certification Status Ends:  11/09/2015    Goals:  Date (Body Area, Impairment Goal, Functional   Activity, Target Performance) Time Frame Status Date/  Initial   09/04/2015   Patient will demonstrate independence in prescribed HEP with proper form, sets and reps for safe discharge to an independent program.  12 sessions Initial Eval  09/04/2015   Demonstrate proper posture and body mechanics with sitting so patient can sit > 2 hours.  12 sessions Initial Eval    09/04/2015   Decrease Oswestry Low Back Pain Questionnaire to 30% to exceed Minimal Detectable Change (MDC) of 10 points.  12 sessions Initial Eval    09/04/2015   Patient is able to achieve prone plank hold for 30 seconds to demonstrate improved core stabilization for reduced LBP with prolonged standing and walking > 1 hr  12 sessions Initial Eval    09/04/2015   Reduced L piriformis spasm to be able to maintain pelvic alignment and reduce neural tension for centralization of pain to LB only  12 sessions Initial Eval      Signature: [name]. [place], PT, DPT  # [phone] Date: 09/04/2015    Signature: Denton Ar, MD PHD _______________________  Date:  ________________     Patient Name: Alyssa Rowe  MRN: [id number]

## 2015-09-25 NOTE — PT/OT Therapy Note (Signed)
DAILY NOTE   09/25/2015        Total Treatment (billable)Time: 55   Total Timed Minutes:  40 Visit Number:  6      Payor: Advertising copywriter / Plan: [employer] [phone] PO [id number] NON OPTIONS / Product Type: *No Product type* /    # of Authorized Visits: 24 Visit #: 6      Diagnosis (Treating/Medical):     ICD-10-CM    1. Acute left-sided low back pain with left-sided sciatica M54.42    2. Lumbar radiculopathy M54.16            Subjective:  Marabella's pain is Constant/continuous and is rated a 7/10.  Functional Status: Patient reports her pain has progressively been increasing over the weekend.    Objective:   Treatment:  Therapeutic Exercise: to improve: Flexibility/ROM, Stabilization and Strength   Warm-up:  On Tm x 6'  Modifications/Patient Education: reduced therex to focus on manual therapy     Manual Therapy:   3 plane sacrum and L piriformis S-CS  Shotgun pubic rami MET  STM to L gluteal and B LS muscles  Manual B HS stretching in 90/90 position (Positive SLR test on L LE)  [place] to lumbar paraspinals      Initial Evaluation Reference and/orCurrent Measurements (ROM, Strength, Girth, Outcomes, etc.):   Pubic rami asymmetry  Pelvic asymmetry and L piriformis spasmn    Modalities: Electrical Stimulation with Heat: IFC 15 min. Location lumbar spine Position Recumbant and Ultrasound Parameters: 1.5 w/cm2 8 min. at 50% and Location lumbar paraspinals Position Other: prone over 1 pillow  Therapy Rationale: Decrease Pain, Decrease Spasm, Decrease Inflammation, Decrease Edema and Increase Extensiblility       Assessment (response to treatment):   Patient denies any improvements in pain after today's session.  She reports some aggravation in L posterior thigh.  She is advised that she can trial holding L heel lift to see if it improves pain but if it doesn't to put it back in.  Patient's pelvic asymmetries are corrected with manual therapy.    Progress towards functional goals: NT    Patient requires continued skilled care to:  Improve core stabilization.    Plan:  Continue with Plan of Care and Adjustments/Modifications: resume therex    [name]. Delene Ruffini, PT, DPT [place] # [phone]  09/25/2015

## 2015-09-27 ENCOUNTER — Inpatient Hospital Stay: Payer: No Typology Code available for payment source

## 2015-09-30 ENCOUNTER — Inpatient Hospital Stay
Payer: No Typology Code available for payment source | Attending: Orthopaedic Surgery | Admitting: Rehabilitative and Restorative Service Providers"

## 2015-09-30 DIAGNOSIS — M5416 Radiculopathy, lumbar region: Secondary | ICD-10-CM | POA: Insufficient documentation

## 2015-09-30 DIAGNOSIS — M5442 Lumbago with sciatica, left side: Secondary | ICD-10-CM | POA: Insufficient documentation

## 2015-09-30 NOTE — PT/OT Exercise Plan (Signed)
Name: Alyssa Rowe  Referring Physician: Denton Ar, MD PHD  Diagnosis:     ICD-10-CM    1. Acute left-sided low back pain with left-sided sciatica M54.42    2. Lumbar radiculopathy M54.16         Precautions:  Hx of lumbar discectomy and fusion (L4-S1) Date of Surgery:    MD Follow-up: 10/01/15          Exercise Flow Sheet    Exercise Specifics 09/04/2015 09/06/15 09/10/15 09/12/15 09/18/2015 09/25/15 09/30/15         TM   6'  AD 6'  AD 6'  LMS 6'  2.5 mph  LMS 6' 6'  LMS       Prone lying/prone on elbows   Prone laying 1' no pillow AD    1' POEs with 1 pillow under hips  AD    Prone laying with 1 pillow 1'  AD    POEs with 1 pillow under hips 1' AD See below AD NT NT NT Prone on elbows  5 x 10"  LMS       PPT with marches/SLR     NT PPT 5 sec. 10x AD    PPT with marches 20x AD    PPT with SLR 10x each AD  NT PPT  20 x 5"  LMS  PPT with marches  20x  LMS NT NT       Bridges     When checking alignment , verbal instruction for home AD 10x AD NT Bridges with core stabilization  20 x 5"  LMS NT NT       Prone UE/LE lifts     UEs 10x each with 1 pillow under hips AD 10x each AD NT Prone hip ext  SLR  15 x ea LE  LMS NT NT       clams   NT 15x each AD NT NT NT NT       Supermans     NT NT NT NT NT NT       Planks     NT NT NT NT NT NT              Supine L leg lengthener  10 x 5"  LMS NT NT Supine leg lengthener  R LE only  10 x 5"  LMS       Manual   See note AD See note AD See note  LMS See note  LMS   See note  LMS See note  LMS       MOC   No traction  MH/ES  AD MH/ES AD MH/ES post tx x 15'  LMS MH/ES post tx x 15'  LMS [place]   MH/ES post tx x 15'  LMS MH/ES post tx x 15'  LMS       Home Exercise Program     Verbal bridges and PPT for home  AD Gave copies   AD           (Initials = supervised exercise by clinician)    Plan Of Care: Body Mechanics Education, NMR, Proprioceptive Activites, Electrical Stimulation, Instruction in HEP, Ultrasound, Therapeutic Exercise, Balance/Gait training, Soft Tissue/Joint Mobilization STM  and MFR of lumbosacral spine and Other: taping    Frequency/Duration: 2 times a week for 12 sessions. Certification Status Ends: 11/09/2015    Goals:  Date (Body Area, Impairment Goal, Functional   Activity, Target Performance) Time Frame Status Date/  Initial  09/04/2015   Patient will demonstrate independence in prescribed HEP with proper form, sets and reps for safe discharge to an independent program.  12 sessions Initial Eval    09/04/2015   Demonstrate proper posture and body mechanics with sitting so patient can sit > 2 hours.  12 sessions Initial Eval    09/04/2015   Decrease Oswestry Low Back Pain Questionnaire to 30% to exceed Minimal Detectable Change (MDC) of 10 points.  12 sessions Initial Eval    09/04/2015   Patient is able to achieve prone plank hold for 30 seconds to demonstrate improved core stabilization for reduced LBP with prolonged standing and walking > 1 hr  12 sessions Initial Eval    09/04/2015   Reduced L piriformis spasm to be able to maintain pelvic alignment and reduce neural tension for centralization of pain to LB only  12 sessions Initial Eval      Signature: [name]. [place], PT, DPT [place] # [phone] Date: 09/04/2015    Signature: Denton Ar, MD PHD _______________________  Date:  ________________     Patient Name: Alyssa Rowe  MRN: [id number]

## 2015-09-30 NOTE — PT/OT Plan of Care (Signed)
Progress Update IPTC Medicare Provider #: [phone]                Patient Name: Alyssa Rowe  MRN: [id number]  DOI: Onset of Problem / Injury: 07/07/15 DOS: N/A  SOC:09/04/2015    Diagnosis:     ICD-10-CM    1. Acute left-sided low back pain with left-sided sciatica M54.42    2. Lumbar radiculopathy M54.16        ASSESSMENT: the patient is a 30 y.o. 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    Pain located: LB and L posterior leg    Clinical presentation: evolving due to reoccurrence of symptoms 2 months ago    Functional Limitations (PLOF): Patient states that she had been symptom relief from surgery in 2015 until 2 months ago and did not have mobility or positioning restrictions)    Plan Of Care: Body Mechanics Education, NMR, Proprioceptive Activites, Electrical Stimulation, Instruction in HEP, Ultrasound, Therapeutic Exercise, Balance/Gait training, Soft Tissue/Joint Mobilization STM and MFR of lumbosacral spine and Other: taping    Frequency/Duration: 2 times a week for 12 sessions. Certification Status Ends: 11/09/2015    Goals:  Date (Body Area, Impairment Goal, Functional   Activity, Target Performance) Time Frame Status Date/  Initial   09/04/2015   Patient will demonstrate independence in prescribed HEP with proper form, sets and reps for safe discharge to an independent program.  12 sessions Limited by pain 09/30/15  LMS   09/04/2015   Demonstrate proper posture and body mechanics with sitting so patient can sit > 2 hours.  12 sessions Status Quo 09/30/15  LMS   09/04/2015   Decrease Oswestry Low Back Pain Questionnaire to 30% to exceed Minimal Detectable Change  (MDC) of 10 points.  12 sessions Increased dysfunction 09/30/15  LMS   09/04/2015   Patient is able to achieve prone plank hold for 30 seconds to demonstrate improved core stabilization for reduced LBP with prolonged standing and walking > 1 hr  12 sessions NT 09/30/15  LMS   09/04/2015   Reduced L piriformis spasm to be able to maintain pelvic alignment and reduce neural tension for centralization of pain to LB only  12 sessions Progressing 09/30/15  LMS     Signature: [name]. [place], PT, DPT [place] # [phone] Date: 09/04/2015    Signature: Denton Ar, MD PHD _______________________  Date:  ________________     Current Status:   Service Dates:   From:   09/04/2015 To: 09/30/2015  Visits from [facility]: 7    Objective Status:  Subjective:  Katiria's pain is Constant/continuous and is rated a 8/10.  Functional Status: Patient states that she feels like she's a roller coaster ride.  She had been doing better but now things are worse.  She states that she's been getting spasms in L leg while walking.  She states that she had a lot of pain while trying to sit at church over the weekend.  She states that she took the insert out of L shoe over the weekend and it didn't help.  Patient reports she sees the MD tomorrow.    Objective:     Initial Evaluation Reference and/orCurrent Measurements (ROM, Strength, Girth, Outcomes, etc.):  Progress Update   Outcome Measure:    Oswestry Score: 48 % (IE); 68% (5/22)  Pain Score: 50% (IE); 70% (5/22)  Rate Satisfaction with Current Function: 6/10 (IE); 2/10 (5/22)    Posture: Deficits noted: Pes Planus bilateral; Pelvic asymmetry in sitting/standing (IE); pelvic symmetry in sitting/standing; symmetrical piriformis length; R pelvic upslip (5/22)    AROM: Lumbar Spine    Initial      Flexion  80      Extension  22        R  L    Side Bending  32  32 increased L LE numbness    (blank fields were intentionally left blank)    Repeated flexion/extension centralizes symptoms? No      Initial R   5/22  R   LE  Strength   MMT /5  Initial   L   5/22  L     4+  4+  Hip Flexion (L1/2)  4    4+   5    5 Hip Adduction  5    5   5     5  Hip Abduction  5     5    5    5  Knee Flexion (S1)   5     5    5    5  Knee Extension (L3)  5     5    5    5  Ankle DF (L4)  5     5   5   5  Ankle PF  5   5   (blank fields were intentionally left blank)    Palpation: Pain to palpation: lower lumbar spine      Assessment (response to treatment):   Patient has been seen for 7 visits since [facility].  She initially had good reduction in pain and centralization of symptoms after pelvic alignment correction and issuance of small heel lift to equalize leg length.   She had reported 70% improvement in pain but on/around the weekend of May 12-14 she reports insidious increase in pain.  Patient trialed removing heel lift but there were no changes in symptoms.  Patient did have pelvic upslip as a result of removing lift so PT instructs patient how to correct upslip with active leg lengtheners.  She is instructed to return to using heel lift.  Her outcome measures reflect increased pain and functional limitations since IE. Her LE strength has remained functional with slight increase in L hip flexor.  Her pain remains constant in all positions and with all activities over the last 2 sessions.  Patient is advised that after follow up with MD if she is to continue with PT to get a new Rx.    Plan:  Adjustments/Modifications: Await further orders from MD    Signature: [name]. [place], PT, DPT [place] # [phone]  Date: 09/30/2015    Signature: Denton Ar, MD PHD ____________________ Date:     Patient Name: Alyssa Rowe  MRN: [id number]

## 2015-09-30 NOTE — PT/OT Therapy Note (Signed)
DAILY NOTE   09/30/2015      (Patient arrives 41' late)  Total Treatment (billable)Time: 50'   Total Timed Minutes:  32' Visit Number:  7      Payor: Advertising copywriter / Plan: [employer] [phone] PO [id number] NON OPTIONS / Product Type: *No Product type* /    # of Authorized Visits: 24 Visit #: 6      Diagnosis (Treating/Medical):     ICD-10-CM    1. Acute left-sided low back pain with left-sided sciatica M54.42    2. Lumbar radiculopathy M54.16            Subjective:  Alyssa Rowe's pain is Constant/continuous and is rated a 8/10.  Functional Status: Patient states that she feels like she's a roller coaster ride.  She had been doing better but now things are worse.  She states that she's been getting spasms in L leg while walking.  She states that she had a lot of pain while trying to sit at church over the weekend.  She states that she took the insert out of L shoe over the weekend and it didn't help.  Patient reports she sees the MD tomorrow.    Objective:   Treatment:  Therapeutic Exercise: to improve: Flexibility/ROM, Stabilization and Strength   Warm-up:  On TM x 6'  Modifications/Patient Education: Reduced therex due to tardiness and need for reassessment Verbal, Visual and Tactile cues for therex form    Therapeutic Activities:  Patient education on R upslip today, possibly due to increased WB/stress with removal of L shoe lift.  Patient is advised to return to using heel lift in L shoe.  She is educated again on leg lengthener for self correction, advised to complete 2x/day    Manual Therapy:   Objective measures  Alignment check  STM to L gluteal      Initial Evaluation Reference and/orCurrent Measurements (ROM, Strength, Girth, Outcomes, etc.):   Progress Update   Outcome Measure:    Oswestry Score: 48 % (IE); 68% (5/22)  Pain Score: 50% (IE); 70% (5/22)  Rate Satisfaction with Current Function: 6/10 (IE); 2/10 (5/22)  Observation/Posture/Gait/Integumentary  Observation: Patient is a pleasant 30 y.o female in no signs of  acute distress  Posture: Deficits noted: Pes Planus bilateral; Pelvic asymmetry in sitting/standing (IE); pelvic symmetry in sitting/standing; symmetrical piriformis length; R pelvic upslip (5/22)    AROM: Lumbar Spine    Initial      Flexion  80      Extension  22        R  L    Side Bending  32  32 increased L LE numbness    (blank fields were intentionally left blank)    Repeated flexion/extension centralizes symptoms? No      Initial R   5/22  R   LE Strength   MMT /5  Initial   L   5/22  L     4+  4+  Hip Flexion (L1/2)  4    4+   5    5 Hip Adduction  5    5   5     5  Hip Abduction  5     5    5    5  Knee Flexion (S1)   5     5    5    5  Knee Extension (L3)  5     5    5    5  Ankle DF (L4)  5  5   5   5  Ankle PF  5   5   (blank fields were intentionally left blank)    Palpation: Pain to palpation: lower lumbar spine     Modalities: Electrical Stimulation with Heat: IFC 15 min. Location LB Position Recumbant  Therapy Rationale: Decrease Pain, Decrease Spasm, Decrease Inflammation, Decrease Edema and Increase Extensiblility       Assessment (response to treatment):   Patient has been seen for 7 visits since [facility].  She initially had good reduction in pain and centralization of symptoms after pelvic alignment correction and issuance of small heel lift to equalize leg length.   She had reported 70% improvement in pain but on/around the weekend of May 12-14 she reports insidious increase in pain.  Patient trialed removing heel lift but there were no changes in symptoms.  Patient did have pelvic upslip as a result of removing lift so PT instructs patient how to correct upslip with active leg lengtheners.  She is instructed to return to using heel lift.  Her outcome measures reflect increased pain and functional limitations since IE. Her LE strength has remained functional with slight increase in L hip flexor strength.  Her pain remains constant in all positions and with all activities over the last 2 sessions.   Patient is advised that after follow up with MD if she is to continue with PT to get a new Rx.    Plan:  Adjustments/Modifications: Await further orders from MD    [name]. Delene Ruffini, PT, DPT [place] # [phone]  09/30/2015

## 2015-10-16 ENCOUNTER — Inpatient Hospital Stay: Payer: No Typology Code available for payment source | Admitting: Rehabilitative and Restorative Service Providers"

## 2015-10-22 ENCOUNTER — Inpatient Hospital Stay
Payer: No Typology Code available for payment source | Attending: Orthopaedic Surgery | Admitting: Rehabilitative and Restorative Service Providers"

## 2015-10-22 DIAGNOSIS — M5416 Radiculopathy, lumbar region: Secondary | ICD-10-CM

## 2015-10-22 DIAGNOSIS — M5442 Lumbago with sciatica, left side: Secondary | ICD-10-CM | POA: Insufficient documentation

## 2015-10-22 NOTE — PT/OT Therapy Note (Signed)
DAILY NOTE   10/22/2015        Total Treatment (billable)Time: 30   Total Timed Minutes:  30 Visit Number:  8      Payor: Advertising copywriter / Plan: [employer] [phone] PO [phone] NON OPTIONS / Product Type: *No Product type* /    # of Authorized Visits: 24 Visit #: 8      Diagnosis (Treating/Medical):     ICD-10-CM    1. Acute left-sided low back pain with left-sided sciatica M54.42    2. Lumbar radiculopathy M54.16            Subjective:  Alyssa Rowe's pain is Constant/continuous and is rated a 8/10.  Functional Status: Patient reports she had an epidural injection and it made her pain worse.  Her MD wishes to do L2-L3 fusion and correction of displacement of L5 spacer but patient has to receive 2 more injections prior to surgical authorization.  Patient has been working on her PT HEP independently and due to financial stress of copays, wishes to continue independently with HEP until surgery.    Objective:   Treatment:  Therapeutic Exercise: to improve: Flexibility/ROM, Stabilization and Strength   Warm-up:  On rec bike x 6' with subjective obtained to assist with today's treatment session   Modifications/Patient Education: Updated HEP and discussed instructions for discharge Verbal, Visual and Tactile cues for therex form    Therapeutic Activities:  Discussed patient's request to be discharged to HEP prior to surgery due to financial barriers of copays.  Discussed body mechanics with sitting, lifting to help minimize LB stress with daily activities      Initial Evaluation Reference and/orCurrent Measurements (ROM, Strength, Girth, Outcomes, etc.):   Discharge Summary   Outcome Measure:    Oswestry Score: 48 % (IE); 68% (5/22); 50% (6/13)  Pain Score: 50% (IE); 70% (5/22); 70% (6/13)  Rate Satisfaction with Current Function: 6/10 (IE); 2/10 (5/22); 7/10 (6/13)  HEP adherence:  Most Days  Observation/Posture/Gait/Integumentary  Observation: Patient is a pleasant 30 y.o female in no signs of acute distress  Posture: Deficits noted:  Pes Planus bilateral; Pelvic asymmetry in sitting/standing (IE); pelvic symmetry in sitting/standing; symmetrical piriformis length; R pelvic upslip (5/22)    AROM: Lumbar Spine     Initial        Flexion   80        Extension   22           R   L     Side Bending   32   32 increased L LE numbness     (blank fields were intentionally left blank)    Repeated flexion/extension centralizes symptoms? No      Initial R    5/22   R   LE Strength    MMT /5   Initial     L   5/22   L     4+   4+   Hip Flexion (L1/2)   4     4+    5     5  Hip Adduction   5     5    5      5   Hip Abduction   5      5     5     5   Knee Flexion (S1)    5      5     5     5   Knee Extension (L3)   5  5     5     5   Ankle DF (L4)   5      5    5    5   Ankle PF   5    5    (blank fields were intentionally left blank)    Palpation: Pain to palpation: lower lumbar spine     Assessment (response to treatment):   Patient has been discharged to continue with independent HEP at home/gym until surgery.  She voices understanding of discharge instructions for body mechanics and posture.    Plan:  Discharged from P.T./O.T. Discharge to continue with HEP until surgery    Alyssa Rowe. Delene Ruffini, PT, DPT [place] # [phone]  10/22/2015

## 2015-10-22 NOTE — PT/OT Exercise Plan (Signed)
Name: Alyssa Rowe  Referring Physician: Denton Ar, MD PHD  Diagnosis:     ICD-10-CM    1. Acute left-sided low back pain with left-sided sciatica M54.42    2. Lumbar radiculopathy M54.16         Precautions:  Hx of lumbar discectomy and fusion (L4-S1) Date of Surgery:    MD Follow-up: 10/01/15          Exercise Flow Sheet    Exercise Specifics 09/04/2015 09/06/15 09/10/15 09/12/15 09/18/2015 09/25/15 09/30/15 10/22/15        TM   6'  AD 6'  AD 6'  LMS 6'  2.5 mph  LMS 6' 6'  LMS rec bike x 6'  LMS      Prone lying/prone on elbows   Prone laying 1' no pillow AD    1' POEs with 1 pillow under hips  AD    Prone laying with 1 pillow 1'  AD    POEs with 1 pillow under hips 1' AD See below AD NT NT NT Prone on elbows  5 x 10"  LMS NT      PPT with marches/SLR     NT PPT 5 sec. 10x AD    PPT with marches 20x AD    PPT with SLR 10x each AD  NT PPT  20 x 5"  LMS  PPT with marches  20x  LMS NT NT NT      Bridges     When checking alignment , verbal instruction for home AD 10x AD NT Bridges with core stabilization  20 x 5"  LMS NT NT NT      Prone UE/LE lifts     UEs 10x each with 1 pillow under hips AD 10x each AD NT Prone hip ext  SLR  15 x ea LE  LMS NT NT NT      clams   NT 15x each AD NT NT NT NT NT      Supermans     NT NT NT NT NT NT NT      Planks     NT NT NT NT NT NT NT             Supine L leg lengthener  10 x 5"  LMS NT NT Supine leg lengthener  R LE only  10 x 5"  LMS NT      Manual   See note AD See note AD See note  LMS See note  LMS   See note  LMS See note  LMS NT      MOC   No traction  MH/ES  AD MH/ES AD MH/ES post tx x 15'  LMS MH/ES post tx x 15'  LMS [place]   MH/ES post tx x 15'  LMS MH/ES post tx x 15'  LMS NT      Home Exercise Program     Verbal bridges and PPT for home  AD Gave copies   AD     Updated for D/C HEP  LMS      (Initials = supervised exercise by clinician)    Plan Of Care: Body Mechanics Education, NMR, Proprioceptive Activites, Electrical Stimulation, Instruction in HEP, Ultrasound, Therapeutic  Exercise, Balance/Gait training, Soft Tissue/Joint Mobilization STM and MFR of lumbosacral spine and Other: taping    Frequency/Duration: 2 times a week for 12 sessions. Certification Status Ends: 11/09/2015    Goals:  Date (Body Area, Impairment Goal, Functional   Activity,  Target Performance) Time Frame Status Date/  Initial   09/04/2015   Patient will demonstrate independence in prescribed HEP with proper form, sets and reps for safe discharge to an independent program.  12 sessions Initial Eval    09/04/2015   Demonstrate proper posture and body mechanics with sitting so patient can sit > 2 hours.  12 sessions Initial Eval    09/04/2015   Decrease Oswestry Low Back Pain Questionnaire to 30% to exceed Minimal Detectable Change (MDC) of 10 points.  12 sessions Initial Eval    09/04/2015   Patient is able to achieve prone plank hold for 30 seconds to demonstrate improved core stabilization for reduced LBP with prolonged standing and walking > 1 hr  12 sessions Initial Eval    09/04/2015   Reduced L piriformis spasm to be able to maintain pelvic alignment and reduce neural tension for centralization of pain to LB only  12 sessions Initial Eval      Signature: [name]. [place], PT, DPT [place] # [phone] Date: 09/04/2015    Signature: Denton Ar, MD PHD _______________________  Date:  ________________     Patient Name: Alyssa Rowe  MRN: [id number]

## 2015-10-22 NOTE — PT/OT Plan of Care (Signed)
Discharge Summary IPTC Medicare Provider #: [id number]                Patient Name: Alyssa Rowe  MRN: [id number]  DOI: Onset of Problem / Injury: 07/07/15 DOS: N/A  SOC:09/04/2015    Diagnosis:     ICD-10-CM    1. Acute left-sided low back pain with left-sided sciatica M54.42    2. Lumbar radiculopathy M54.16        ASSESSMENT: the patient is a 30 y.o. 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    Pain located: LB and L posterior leg    Clinical presentation: evolving due to reoccurrence of symptoms 2 months ago    Functional Limitations (PLOF): Patient states that she had been symptom relief from surgery in 2015 until 2 months ago and did not have mobility or positioning restrictions)    Plan Of Care: Body Mechanics Education, NMR, Proprioceptive Activites, Electrical Stimulation, Instruction in HEP, Ultrasound, Therapeutic Exercise, Balance/Gait training, Soft Tissue/Joint Mobilization STM and MFR of lumbosacral spine and Other: taping    Frequency/Duration: 2 times a week for 12 sessions. Certification Status Ends: 11/09/2015    Goals:  Date (Body Area, Impairment Goal, Functional   Activity, Target Performance) Time Frame Status Date/  Initial   09/04/2015   Patient will demonstrate independence in prescribed HEP with proper form, sets and reps for safe discharge to an independent program.  12 sessions Met 10/22/15  LMS   09/04/2015   Demonstrate proper posture and body mechanics with sitting so patient can sit > 2 hours.  12 sessions Status Quo 09/30/15  LMS   09/04/2015   Decrease Oswestry Low Back Pain Questionnaire to 30% to exceed Minimal Detectable Change (MDC) of 10  points.  12 sessions Status Quo 10/22/15  LMS   09/04/2015   Patient is able to achieve prone plank hold for 30 seconds to demonstrate improved core stabilization for reduced LBP with prolonged standing and walking > 1 hr  12 sessions NT 09/30/15  LMS   09/04/2015   Reduced L piriformis spasm to be able to maintain pelvic alignment and reduce neural tension for centralization of pain to LB only  12 sessions Progressing 09/30/15  LMS     Signature: [name]. [place], PT, DPT [place] # [phone] Date: 09/04/2015    Signature: Denton Ar, MD PHD _______________________  Date:  ________________     Discharge Status:   Service Dates:   From:   09/04/2015 To: 10/22/2015  Visits from [facility]: 8    Objective Status:  Subjective:  Alyssa Rowe's pain is Constant/continuous and is rated a 8/10.  Functional Status: Patient reports she had an epidural injection and it made her pain worse.  Her MD wishes to do L2-L3 fusion and correction of displacement of L5 spacer but patient has to receive 2 more injections prior to surgical authorization.  Patient has been working on her PT HEP independently and due to financial stress of copays, wishes to continue independently with HEP until surgery.    Objective:       Initial Evaluation Reference and/orCurrent Measurements (ROM, Strength, Girth, Outcomes, etc.):   Discharge Summary   Outcome Measure:    Oswestry Score: 48 % (IE); 68% (5/22);  50% (6/13)  Pain Score: 50% (IE); 70% (5/22); 70% (6/13)  Rate Satisfaction with Current Function: 6/10 (IE); 2/10 (5/22); 7/10 (6/13)  HEP adherence:  Most Days  Observation/Posture/Gait/Integumentary  Observation: Patient is a pleasant 30 y.o female in no signs of acute distress  Posture: Deficits noted: Pes Planus bilateral; Pelvic asymmetry in sitting/standing (IE); pelvic symmetry in sitting/standing; symmetrical piriformis length; R pelvic upslip (5/22)    AROM: Lumbar Spine     Initial        Flexion   80        Extension   22           R   L     Side Bending   32   32  increased L LE numbness     (blank fields were intentionally left blank)    Repeated flexion/extension centralizes symptoms? No      Initial R    5/22   R   LE Strength    MMT /5   Initial     L   5/22   L     4+   4+   Hip Flexion (L1/2)   4     4+    5     5  Hip Adduction   5     5    5      5   Hip Abduction   5      5     5     5   Knee Flexion (S1)    5      5     5     5   Knee Extension (L3)   5      5     5     5   Ankle DF (L4)   5      5    5    5   Ankle PF   5    5    (blank fields were intentionally left blank)    Palpation: Pain to palpation: lower lumbar spine     Assessment (response to treatment):   Patient has been discharged to continue with independent HEP at home/gym until surgery.  She voices understanding of discharge instructions for body mechanics and posture.    Plan:  Discharged from P.T./O.T. Discharge to continue with HEP until surgery    Signature: [name]. [place], PT, DPT [place] # [phone]  Date: 10/22/2015    Signature: Denton Ar, MD PHD ____________________ Date:     Patient Name: Alyssa Rowe  MRN: [id number]

## 2015-10-24 ENCOUNTER — Inpatient Hospital Stay: Payer: No Typology Code available for payment source | Admitting: Rehabilitative and Restorative Service Providers"

## 2017-10-19 DIAGNOSIS — N925 Other specified irregular menstruation: Secondary | ICD-10-CM | POA: Diagnosis not present

## 2017-10-20 DIAGNOSIS — N925 Other specified irregular menstruation: Secondary | ICD-10-CM | POA: Diagnosis not present

## 2017-10-25 ENCOUNTER — Encounter (HOSPITAL_COMMUNITY): Payer: Self-pay | Admitting: *Deleted

## 2017-10-25 ENCOUNTER — Inpatient Hospital Stay (HOSPITAL_COMMUNITY): Payer: 59

## 2017-10-25 ENCOUNTER — Inpatient Hospital Stay (HOSPITAL_COMMUNITY)
Admission: AD | Admit: 2017-10-25 | Discharge: 2017-10-25 | Disposition: A | Discharge status: Home / Self Care | Payer: 59 | Source: Ambulatory Visit | Attending: Obstetrics and Gynecology | Admitting: Obstetrics and Gynecology

## 2017-10-25 DIAGNOSIS — N83209 Unspecified ovarian cyst, unspecified side: Secondary | ICD-10-CM | POA: Diagnosis not present

## 2017-10-25 DIAGNOSIS — Z8249 Family history of ischemic heart disease and other diseases of the circulatory system: Secondary | ICD-10-CM | POA: Diagnosis not present

## 2017-10-25 DIAGNOSIS — Z3A08 8 weeks gestation of pregnancy: Secondary | ICD-10-CM | POA: Diagnosis not present

## 2017-10-25 DIAGNOSIS — R1032 Left lower quadrant pain: Secondary | ICD-10-CM | POA: Diagnosis not present

## 2017-10-25 DIAGNOSIS — R102 Pelvic and perineal pain: Secondary | ICD-10-CM | POA: Diagnosis present

## 2017-10-25 DIAGNOSIS — O26891 Other specified pregnancy related conditions, first trimester: Secondary | ICD-10-CM

## 2017-10-25 DIAGNOSIS — O99331 Smoking (tobacco) complicating pregnancy, first trimester: Secondary | ICD-10-CM | POA: Insufficient documentation

## 2017-10-25 DIAGNOSIS — O3481 Maternal care for other abnormalities of pelvic organs, first trimester: Secondary | ICD-10-CM | POA: Diagnosis not present

## 2017-10-25 DIAGNOSIS — N8312 Corpus luteum cyst of left ovary: Secondary | ICD-10-CM | POA: Diagnosis not present

## 2017-10-25 DIAGNOSIS — F17210 Nicotine dependence, cigarettes, uncomplicated: Secondary | ICD-10-CM | POA: Diagnosis not present

## 2017-10-25 DIAGNOSIS — R109 Unspecified abdominal pain: Secondary | ICD-10-CM

## 2017-10-25 DIAGNOSIS — Z880 Allergy status to penicillin: Secondary | ICD-10-CM | POA: Diagnosis not present

## 2017-10-25 LAB — POCT PREGNANCY, URINE: PREG TEST UR: POSITIVE — AB

## 2017-10-25 LAB — URINALYSIS, ROUTINE W REFLEX MICROSCOPIC
BILIRUBIN URINE: NEGATIVE
Glucose, UA: NEGATIVE mg/dL
Hgb urine dipstick: NEGATIVE
KETONES UR: NEGATIVE mg/dL
LEUKOCYTES UA: NEGATIVE
NITRITE: NEGATIVE
PH: 6 (ref 5.0–8.0)
Protein, ur: NEGATIVE mg/dL
Specific Gravity, Urine: 1.018 (ref 1.005–1.030)

## 2017-10-25 LAB — CBC WITH DIFFERENTIAL/PLATELET
BASOS ABS: 0 10*3/uL (ref 0.0–0.1)
BASOS PCT: 1 %
EOS ABS: 0.1 10*3/uL (ref 0.0–0.7)
EOS PCT: 1 %
HCT: 37.3 % (ref 36.0–46.0)
Hemoglobin: 12.9 g/dL (ref 12.0–15.0)
Lymphocytes Relative: 43 %
Lymphs Abs: 2.8 10*3/uL (ref 0.7–4.0)
MCH: 29.6 pg (ref 26.0–34.0)
MCHC: 34.6 g/dL (ref 30.0–36.0)
MCV: 85.6 fL (ref 78.0–100.0)
Monocytes Absolute: 0.3 10*3/uL (ref 0.1–1.0)
Monocytes Relative: 5 %
Neutro Abs: 3.4 10*3/uL (ref 1.7–7.7)
Neutrophils Relative %: 50 %
PLATELETS: 342 10*3/uL (ref 150–400)
RBC: 4.36 MIL/uL (ref 3.87–5.11)
RDW: 13.1 % (ref 11.5–15.5)
WBC: 6.6 10*3/uL (ref 4.0–10.5)

## 2017-10-25 LAB — HCG, QUANTITATIVE, PREGNANCY: HCG, BETA CHAIN, QUANT, S: 61241 m[IU]/mL — AB (ref <5)

## 2017-10-25 MED ORDER — ACETAMINOPHEN 325 MG PO TABS
650.0000 mg | ORAL_TABLET | Freq: Four times a day (QID) | ORAL | Status: DC | PRN
Start: 2017-10-25 — End: 2017-10-25
  Administered 2017-10-25: 650 mg via ORAL
  Filled 2017-10-25: qty 2

## 2017-10-25 MED ORDER — ACETAMINOPHEN 325 MG PO TABS: 650.0000 mg | ORAL_TABLET | Freq: Four times a day (QID) | ORAL | 0 refills | Status: DC | PRN

## 2017-10-25 NOTE — Discharge Instructions (Signed)
Ovarian Cyst An ovarian cyst is a fluid-filled sac on an ovary. The ovaries are organs that make eggs in women. Most ovarian cysts go away on their own and are not cancerous (are benign). Some cysts need treatment. Follow these instructions at home:  Take over-the-counter and prescription medicines only as told by your doctor.  Do not drive or use heavy machinery while taking prescription pain medicine.  Get pelvic exams and Pap tests as often as told by your doctor.  Return to your normal activities as told by your doctor. Ask your doctor what activities are safe for you.  Do not use any products that contain nicotine or tobacco, such as cigarettes and e-cigarettes. If you need help quitting, ask your doctor.  Keep all follow-up visits as told by your doctor. This is important. Contact a doctor if:  You have pelvic pain that does not go away.  You have pressure on your bladder.  You have trouble making your bladder empty when you pee (urinate).  You have pain during sex.  You have any of the following in your belly (abdomen): ? A feeling of fullness. ? Pressure. ? Discomfort. ? Pain that does not go away. ? Swelling.  You feel sick most of the time.  You have trouble pooping (have constipation).  You are not as hungry as usual (you lose your appetite).  You are gaining weight or losing weight without changing your exercise and eating habits. Get help right away if:  You have belly pain that is very bad or gets worse.  You cannot eat or drink without throwing up (vomiting).  You suddenly get a fever. This information is not intended to replace advice given to you by your health care provider. Make sure you discuss any questions you have with your health care provider. Document Released: 10/14/2007 Document Revised: 11/15/2015 Document Reviewed: 09/29/2015 Elsevier Interactive Patient Education  [employer]2018 Elsevier Inc.

## 2017-10-25 NOTE — MAU Provider Note (Signed)
History     CSN: [id number]  Arrival date and time: 10/25/17 0900   None     Chief Complaint  Patient presents with  . Abdominal Pain   Patient reports left pelvic pain since Thursday. She was seen for missed period appointment in office and did have ultrasound showing SIUP in the uterus with cardiac activity, possible corpus luteum cyst noted. Denies bleeding or vaginal discharge. Patient states she has a history of ovarian cysts and has had a ruptured cyst in the past, she believes this pain is from a ruptured cyst. Rates pain as 6-7/10, constant aching pain with occasional sharp pain.    OB History    Gravida  4   Para  0   Term  0   Preterm  0   AB  2   Living  0     SAB  0   TAB  2   Ectopic  0   Multiple  0   Live Births              Past Medical History:  Diagnosis Date  . No pertinent past medical history   . Ovarian cyst     Past Surgical History:  Procedure Laterality Date  . BACK SURGERY    . DILATION AND CURETTAGE OF UTERUS    . INDUCED ABORTION     x2  . laporocscopy      Family History  Problem Relation Age of Onset  . Hypertension Mother   . Hypertension Father   . Anesthesia problems Neg Hx   . Hypotension Neg Hx   . Malignant hyperthermia Neg Hx   . Pseudochol deficiency Neg Hx     Social History   Tobacco Use  . Smoking status: Current Some Day Smoker    Packs/day: 0.50  . Smokeless tobacco: Never Used  Substance Use Topics  . Alcohol use: No  . Drug use: No    Allergies:  Allergies  Allergen Reactions  . Morphine And Related Itching    Lower doses OK  . Penicillins Other (See Comments)    Childhood reaction ( unknown)    Medications Prior to Admission  Medication Sig Dispense Refill Last Dose  . acetaminophen (TYLENOL) 500 MG tablet Take 500 mg by mouth once.   04/22/2012 at Unknown  . etonogestrel (NEXPLANON) 68 MG IMPL implant Inject 1 each into the skin once. Pt had it implanted 09/2011, in right arm      . Norethin-Eth Estrad-Fe Biphas (LO LOESTRIN FE PO) Take 1 tablet by mouth 2 (two) times daily. Pt started taking a couple of weeks ago (04/10/12). Started with 1 tablet qid for several days, then 1 tablet tid for several days and is currently taking 1 tablet bid   04/22/2012    Review of Systems  Genitourinary: Positive for pelvic pain.  All other systems reviewed and are negative.  Physical Exam   Blood pressure (!) 127/53, pulse 69, temperature 98.5 F (36.9 C), resp. rate (!) 6, height 5\' 9"  (1.753 m), weight 94.8 kg (209 lb), last menstrual period 08/25/2017.  Results for orders placed or performed during the hospital encounter of 10/25/17 (from the past 24 hour(s))  Pregnancy, urine POC     Status: Abnormal   Collection Time: 10/25/17  9:12 AM  Result Value Ref Range   Preg Test, Ur POSITIVE (A) NEGATIVE     Physical Exam  Nursing note and vitals reviewed. Constitutional: She is oriented to person, place, and  time. She appears well-developed and well-nourished.  HENT:  Head: Normocephalic and atraumatic.  Eyes: Pupils are equal, round, and reactive to light.  Cardiovascular: Normal rate, regular rhythm and normal heart sounds.  Respiratory: Effort normal and breath sounds normal.  GI: Soft. Bowel sounds are normal.  Genitourinary: Vagina normal and uterus normal.  Musculoskeletal: Normal range of motion.  Neurological: She is alert and oriented to person, place, and time.  Skin: Skin is warm and dry.  Psychiatric: She has a normal mood and affect. Her behavior is normal. Judgment and thought content normal.    MAU Course  Procedures Ultrasound Quant Sterile speculum exam  MDM Ovarian cyst vs SAB. Ultrasound to verify fetal viability and confirm diagnosis of left corpus luteum cyst, bHCG quant baseline, progesterone level to identify if cyst possibly ruptured and levels are abnormal. U/S shows SIUP with cardiac activity, left corpus luteum cyst at site of pain. Cervix  is closed and without discharge or bleeding on speculum exam. bHCG quant consistent with gestational age. Progesterone currently pending, Nigel BridgemanVicki Latham CNM to follow up from office on 10/27/17.   Assessment and Plan  32 y.o. G4P0 at 4440w5d Corpus luteum cyst Expectant management  If progesterone levels <25, supplement with progesterone  Discharge home with precautions, patient verbalizes understanding of when to present to the MAU for complications   [name] 10/25/2017, 9:34 AM

## 2017-10-25 NOTE — MAU Note (Signed)
Pt presents to MAU with complaints of pain in her left lower side since Thursday. + home pregnancy test on 10/04/17. Denies any VB or abnormal discharge. Pt has been in for her first appointment with Dr Estanislado Pandyivard

## 2017-10-26 LAB — PROGESTERONE: PROGESTERONE: 16.4 ng/mL

## 2017-11-01 ENCOUNTER — Emergency Department (HOSPITAL_COMMUNITY)
Admission: EM | Admit: 2017-11-01 | Discharge: 2017-11-01 | Discharge status: Absent without leave | Payer: BC Managed Care – PPO

## 2017-11-01 ENCOUNTER — Inpatient Hospital Stay (HOSPITAL_COMMUNITY)
Admission: AD | Admit: 2017-11-01 | Discharge: 2017-11-01 | Disposition: A | Discharge status: Home / Self Care | Payer: 59 | Source: Ambulatory Visit | Attending: Obstetrics and Gynecology | Admitting: Obstetrics and Gynecology

## 2017-11-01 ENCOUNTER — Encounter (HOSPITAL_COMMUNITY): Payer: Self-pay | Admitting: *Deleted

## 2017-11-01 DIAGNOSIS — Z885 Allergy status to narcotic agent status: Secondary | ICD-10-CM | POA: Diagnosis not present

## 2017-11-01 DIAGNOSIS — Z3A09 9 weeks gestation of pregnancy: Secondary | ICD-10-CM | POA: Diagnosis not present

## 2017-11-01 DIAGNOSIS — T22011A Burn of unspecified degree of right forearm, initial encounter: Secondary | ICD-10-CM | POA: Diagnosis not present

## 2017-11-01 DIAGNOSIS — X118XXA Contact with other hot tap-water, initial encounter: Secondary | ICD-10-CM | POA: Diagnosis not present

## 2017-11-01 DIAGNOSIS — O99331 Smoking (tobacco) complicating pregnancy, first trimester: Secondary | ICD-10-CM | POA: Diagnosis not present

## 2017-11-01 DIAGNOSIS — Z79899 Other long term (current) drug therapy: Secondary | ICD-10-CM | POA: Diagnosis not present

## 2017-11-01 DIAGNOSIS — T22012A Burn of unspecified degree of left forearm, initial encounter: Secondary | ICD-10-CM | POA: Diagnosis not present

## 2017-11-01 DIAGNOSIS — O99511 Diseases of the respiratory system complicating pregnancy, first trimester: Secondary | ICD-10-CM | POA: Diagnosis not present

## 2017-11-01 DIAGNOSIS — Z880 Allergy status to penicillin: Secondary | ICD-10-CM | POA: Insufficient documentation

## 2017-11-01 DIAGNOSIS — T300 Burn of unspecified body region, unspecified degree: Secondary | ICD-10-CM | POA: Diagnosis not present

## 2017-11-01 DIAGNOSIS — F17210 Nicotine dependence, cigarettes, uncomplicated: Secondary | ICD-10-CM | POA: Diagnosis not present

## 2017-11-01 DIAGNOSIS — Z331 Pregnant state, incidental: Secondary | ICD-10-CM | POA: Diagnosis not present

## 2017-11-01 DIAGNOSIS — J45909 Unspecified asthma, uncomplicated: Secondary | ICD-10-CM | POA: Insufficient documentation

## 2017-11-01 DIAGNOSIS — O9A211 Injury, poisoning and certain other consequences of external causes complicating pregnancy, first trimester: Secondary | ICD-10-CM | POA: Diagnosis not present

## 2017-11-01 DIAGNOSIS — T798XXA Other early complications of trauma, initial encounter: Secondary | ICD-10-CM | POA: Diagnosis not present

## 2017-11-01 DIAGNOSIS — Z9889 Other specified postprocedural states: Secondary | ICD-10-CM | POA: Insufficient documentation

## 2017-11-01 HISTORY — DX: Unspecified asthma, uncomplicated: J45.909

## 2017-11-01 NOTE — MAU Note (Signed)
PT SAYS SHE  WAS MAKING  COFFEE ON 6-18-  AND SHE BURNT HER ARM  ON STEAM.  THEN 2 DAYS LATER - IT BLISTERED. THEN BLISTER BUSTED.

## 2017-11-01 NOTE — MAU Provider Note (Signed)
Chief Complaint: Burn   SUBJECTIVE HPI: [name] is a 32 y.o. G3P0020 at [redacted]w[redacted]d who presents to MAU x 7 days ago pt had a burn from hot water/tea on left medial distal forearm, three days post that the blistered leaked and pt popped it, now red, mildly painful, worried about infection, Pt endorses has FROM with wrist and arm, denies fever. Pt endorses keeping wound clean dry and used neosporin with bandages during the day with improvement.    Past Medical History:  Diagnosis Date  . Asthma   . No pertinent past medical history   . Ovarian cyst    OB History  Gravida Para Term Preterm AB Living  3 0 0 0 2 0  SAB TAB Ectopic Multiple Live Births  0 2 0 0      # Outcome Date GA Lbr Len/2nd Weight Sex Delivery Anes PTL Lv  3 Current           2 TAB           1 TAB            Past Surgical History:  Procedure Laterality Date  . BACK SURGERY    . DILATION AND CURETTAGE OF UTERUS    . INDUCED ABORTION     x2  . laporocscopy     Social History   Socioeconomic History  . Marital status: Single    Spouse name: Not on file  . Number of children: Not on file  . Years of education: Not on file  . Highest education level: Not on file  Occupational History  . Not on file  Social Needs  . Financial resource strain: Not on file  . Food insecurity:    Worry: Not on file    Inability: Not on file  . Transportation needs:    Medical: Not on file    Non-medical: Not on file  Tobacco Use  . Smoking status: Current Some Day Smoker    Packs/day: 0.50  . Smokeless tobacco: Never Used  Substance and Sexual Activity  . Alcohol use: No  . Drug use: No  . Sexual activity: Yes    Birth control/protection: None  Lifestyle  . Physical activity:    Days per week: Not on file    Minutes per session: Not on file  . Stress: Not on file  Relationships  . Social connections:    Talks on phone: Not on file    Gets together: Not on file    Attends religious service: Not on file     Active member of club or organization: Not on file    Attends meetings of clubs or organizations: Not on file    Relationship status: Not on file  . Intimate partner violence:    Fear of current or ex partner: Not on file    Emotionally abused: Not on file    Physically abused: Not on file    Forced sexual activity: Not on file  Other Topics Concern  . Not on file  Social History Narrative  . Not on file   No current facility-administered medications on file prior to encounter.    Current Outpatient Medications on File Prior to Encounter  Medication Sig Dispense Refill  . acetaminophen (TYLENOL) 325 MG tablet Take 2 tablets (650 mg total) by mouth every 6 (six) hours as needed for moderate pain (Take 1-2 tablets every six hours as needed for pain). 30 tablet 0  . doxylamine, Sleep, (UNISOM)  25 MG tablet Take 25 mg by mouth at bedtime as needed for sleep.    Marland Kitchen. ondansetron (ZOFRAN-ODT) 8 MG disintegrating tablet Take 8 mg by mouth every 8 (eight) hours as needed for nausea or vomiting.    . Prenatal Vit-Fe Fumarate-FA (PRENATAL MULTIVITAMIN) TABS tablet Take 1 tablet by mouth daily at 12 noon.    . progesterone 200 MG SUPP Place 200 mg vaginally at bedtime.    Marland Kitchen. albuterol (PROVENTIL HFA;VENTOLIN HFA) 108 (90 Base) MCG/ACT inhaler Inhale 1-2 puffs into the lungs every 6 (six) hours as needed for wheezing or shortness of breath.     Allergies  Allergen Reactions  . Morphine And Related Itching    Lower doses OK  . Penicillins Rash and Other (See Comments)    Childhood reaction ( unknown) Has patient had a PCN reaction causing immediate rash, facial/tongue/throat swelling, SOB or lightheadedness with hypotension: Yes Has patient had a PCN reaction causing severe rash involving mucus membranes or skin necrosis: No Has patient had a PCN reaction that required hospitalization: No Has patient had a PCN reaction occurring within the last 10 years: No If all of the above answers are "NO", then  may proceed with Cephalosporin use.     I have reviewed the past Medical Hx, Surgical Hx, Social Hx, Allergies and Medications.   REVIEW OF SYSTEMS All systems reviewed and are negative for acute change except as noted in the HPI.   OBJECTIVE BP 122/79 (BP Location: Right Arm)   Pulse 93   Temp 98.3 F (36.8 C) (Oral)   Resp 20   Ht 5\' 9"  (1.753 m)   Wt 96.3 kg (212 lb 4 oz)   LMP 08/25/2017   BMI 31.34 kg/m    PHYSICAL EXAM Constitutional: Well-developed, well-nourished female in no acute distress.  Cardiovascular: normal rate and rhythm, pulses intact Respiratory: normal rate and effort. Skin: 3cmX3cm circular burn, deep through epidermis and to dermis.  Not bleeding,Positive erythema in center, not warm, mild tender to touch. No drainage, dry.  GI: Abd soft, non-tender, non-distended. Pos BS x 4 MS: Extremities nontender, no edema, normal ROM Neurologic: Alert and oriented x 4. No focal deficits GU: Neg CVAT. SPECULUM EXAM: NEFG, physiologic discharge, no blood noted, cervix clean Psych: normal mood and affect  LAB RESULTS No results found for this or any previous visit (from the past 24 hour(s)).  IMAGING [place] Ob Less Than 14 Weeks With Ob Transvaginal  Result Date: 10/25/2017 CLINICAL DATA:  Left lower quadrant pain. Gestational age by LMP of 8 weeks 5 days. EXAM: OBSTETRIC <14 WK US AND TRANSVAGINAL OB US TECHNIQUE: Both transabdominal and transvaginal ultrasound examinations were performed for complete evaluation of the gestation as well as the maternal uterus, adnexal regions, and pelvic cul-de-sac. Transvaginal technique was performed to assess early pregnancy. COMPARISON:  None. FINDINGS: Intrauterine gestational sac: Single Yolk sac:  Visualized. Embryo:  Visualized. Cardiac Activity: Visualized. Heart Rate: 161 bpm CRL:  16 mm   8 w   0 d                  US EDC: 06/06/2018 Subchorionic hemorrhage:  None visualized. Maternal uterus/adnexae: Normal appearance of both  ovaries. No mass or abnormal free fluid identified. IMPRESSION: Single living IUP measuring 8 weeks 0 days, which is concordant with LMP. No significant maternal uterine or adnexal abnormality identified. Electronically Signed   By: Myles RosenthalJohn  Stahl M.D.   On: 10/25/2017 12:11    MAU Management/MDM: Vitals  and nursing notes reviewed Orders Placed This Encounter  Procedures  . Diet - low sodium heart healthy  . Increase activity slowly  . Call MD for:  . Call MD for:  temperature >100.4  . Call MD for:  persistant nausea and vomiting  . Call MD for:  severe uncontrolled pain  . Call MD for:  redness, tenderness, or signs of infection (pain, swelling, redness, odor or green/yellow discharge around incision site)  . Call MD for:  difficulty breathing, headache or visual disturbances  . Call MD for:  hives  . Call MD for:  persistant dizziness or light-headedness  . Call MD for:  extreme fatigue  . Discharge patient Discharge disposition: 01-Home or Self Care; Discharge patient date: 11/01/2017    No orders of the defined types were placed in this encounter.   Plan of care reviewed with patient, including labs and tests ordered and medical treatment.  Treatments in MAU included Education and assessment.   ASSESSMENT [name] is a 32 y.o. G3P0020 at [redacted]w[redacted]d who has an IUP : 1. Burn   2. [redacted] weeks gestation of pregnancy     PLAN Discharge home in stable condition. Burn: keep clean dry and intact, use coconut oil or neosporin, bandage when outside and open to air when home, if pus, drainage, redness, tenderness, or fever presents please returns to UC Counseled on return precautions Handout given  Follow-up Information    [facility] Follow up in 1 week(s).   Specialty:  Obstetrics and Gynecology Why:  at next [place] [facility] Contact information: [address] [place] [address] [phone]          Allergies as of  11/01/2017      Reactions   Morphine And Related Itching   Lower doses OK   Penicillins Rash, Other (See Comments)   Childhood reaction ( unknown) Has patient had a PCN reaction causing immediate rash, facial/tongue/throat swelling, SOB or lightheadedness with hypotension: Yes Has patient had a PCN reaction causing severe rash involving mucus membranes or skin necrosis: No Has patient had a PCN reaction that required hospitalization: No Has patient had a PCN reaction occurring within the last 10 years: No If all of the above answers are "NO", then may proceed with Cephalosporin use.      Medication List    TAKE these medications   acetaminophen 325 MG tablet Commonly known as:  TYLENOL Take 2 tablets (650 mg total) by mouth every 6 (six) hours as needed for moderate pain (Take 1-2 tablets every six hours as needed for pain).   albuterol 108 (90 Base) MCG/ACT inhaler Commonly known as:  PROVENTIL HFA;VENTOLIN HFA Inhale 1-2 puffs into the lungs every 6 (six) hours as needed for wheezing or shortness of breath.   doxylamine (Sleep) 25 MG tablet Commonly known as:  UNISOM Take 25 mg by mouth at bedtime as needed for sleep.   ondansetron 8 MG disintegrating tablet Commonly known as:  ZOFRAN-ODT Take 8 mg by mouth every 8 (eight) hours as needed for nausea or vomiting.   prenatal multivitamin Tabs tablet Take 1 tablet by mouth daily at 12 noon.   progesterone 200 MG Supp Place 200 mg vaginally at bedtime.       Mylene Bow  11/01/2017, 11:19 PM

## 2017-11-10 DIAGNOSIS — Z369 Encounter for antenatal screening, unspecified: Secondary | ICD-10-CM | POA: Diagnosis not present

## 2017-11-10 DIAGNOSIS — O09521 Supervision of elderly multigravida, first trimester: Secondary | ICD-10-CM | POA: Diagnosis not present

## 2017-11-10 LAB — OB RESULTS CONSOLE HIV ANTIBODY (ROUTINE TESTING): HIV: NONREACTIVE

## 2017-11-10 LAB — OB RESULTS CONSOLE HEPATITIS B SURFACE ANTIGEN: HEP B S AG: NEGATIVE

## 2017-11-10 LAB — OB RESULTS CONSOLE RUBELLA ANTIBODY, IGM: Rubella: IMMUNE

## 2017-11-10 LAB — OB RESULTS CONSOLE RPR: RPR: NONREACTIVE

## 2017-12-09 DIAGNOSIS — R350 Frequency of micturition: Secondary | ICD-10-CM | POA: Diagnosis not present

## 2017-12-21 ENCOUNTER — Inpatient Hospital Stay (HOSPITAL_COMMUNITY)
Admission: AD | Admit: 2017-12-21 | Discharge: 2017-12-21 | Disposition: A | Discharge status: Home / Self Care | Payer: 59 | Source: Ambulatory Visit | Attending: Obstetrics and Gynecology | Admitting: Obstetrics and Gynecology

## 2017-12-21 ENCOUNTER — Encounter (HOSPITAL_COMMUNITY): Payer: Self-pay | Admitting: *Deleted

## 2017-12-21 DIAGNOSIS — R109 Unspecified abdominal pain: Secondary | ICD-10-CM | POA: Diagnosis present

## 2017-12-21 DIAGNOSIS — O99332 Smoking (tobacco) complicating pregnancy, second trimester: Secondary | ICD-10-CM | POA: Insufficient documentation

## 2017-12-21 DIAGNOSIS — O99512 Diseases of the respiratory system complicating pregnancy, second trimester: Secondary | ICD-10-CM | POA: Diagnosis not present

## 2017-12-21 DIAGNOSIS — R1030 Lower abdominal pain, unspecified: Secondary | ICD-10-CM | POA: Diagnosis not present

## 2017-12-21 DIAGNOSIS — R102 Pelvic and perineal pain: Secondary | ICD-10-CM | POA: Diagnosis not present

## 2017-12-21 DIAGNOSIS — O26892 Other specified pregnancy related conditions, second trimester: Secondary | ICD-10-CM | POA: Insufficient documentation

## 2017-12-21 DIAGNOSIS — Z79899 Other long term (current) drug therapy: Secondary | ICD-10-CM | POA: Diagnosis not present

## 2017-12-21 DIAGNOSIS — O26899 Other specified pregnancy related conditions, unspecified trimester: Secondary | ICD-10-CM

## 2017-12-21 DIAGNOSIS — Z3A16 16 weeks gestation of pregnancy: Secondary | ICD-10-CM | POA: Insufficient documentation

## 2017-12-21 DIAGNOSIS — J45909 Unspecified asthma, uncomplicated: Secondary | ICD-10-CM | POA: Insufficient documentation

## 2017-12-21 DIAGNOSIS — Z3A17 17 weeks gestation of pregnancy: Secondary | ICD-10-CM | POA: Diagnosis not present

## 2017-12-21 LAB — URINALYSIS, ROUTINE W REFLEX MICROSCOPIC
Bilirubin Urine: NEGATIVE
GLUCOSE, UA: NEGATIVE mg/dL
Hgb urine dipstick: NEGATIVE
Ketones, ur: NEGATIVE mg/dL
LEUKOCYTES UA: NEGATIVE
Nitrite: NEGATIVE
PROTEIN: NEGATIVE mg/dL
SPECIFIC GRAVITY, URINE: 1.019 (ref 1.005–1.030)
pH: 6 (ref 5.0–8.0)

## 2017-12-21 LAB — WET PREP, GENITAL
CLUE CELLS WET PREP: NONE SEEN
SPERM: NONE SEEN
Trich, Wet Prep: NONE SEEN
Yeast Wet Prep HPF POC: NONE SEEN

## 2017-12-21 MED ORDER — ACETAMINOPHEN 325 MG PO TABS
650.0000 mg | ORAL_TABLET | Freq: Once | ORAL | Status: AC
Start: 2017-12-21 — End: 2017-12-21
  Administered 2017-12-21: 650 mg via ORAL
  Filled 2017-12-21: qty 2

## 2017-12-21 NOTE — MAU Provider Note (Signed)
History     CSN: [id number]  Arrival date and time: 12/21/17 [id number]   None     Chief Complaint  Patient presents with  . Abdominal Pain   Patient is complaining of sharp, bilateral lower abdominal pain. It started at 0400 this morning and has occurred again every hour to half hour. The pain lasts about a minute each time and is accompanied by nausea. Patient denies nausea when she is not feeling the pain and denies desire for antinausea medication at this time. She denies vaginal bleeding, vaginal symptoms like pruritis, pain or discharge, and urinary symptoms like dysuria or frequency. She states the pain feels like really bad cramps.  Fetal heartrate ausculated with doppler at 147 BPM this morning.    OB History    Gravida  3   Para  0   Term  0   Preterm  0   AB  2   Living  0     SAB  0   TAB  2   Ectopic  0   Multiple  0   Live Births              Past Medical History:  Diagnosis Date  . Asthma   . No pertinent past medical history   . Ovarian cyst     Past Surgical History:  Procedure Laterality Date  . BACK SURGERY    . DILATION AND CURETTAGE OF UTERUS    . INDUCED ABORTION     x2  . laporocscopy      Family History  Problem Relation Age of Onset  . Hypertension Mother   . Hypertension Father   . Anesthesia problems Neg Hx   . Hypotension Neg Hx   . Malignant hyperthermia Neg Hx   . Pseudochol deficiency Neg Hx     Social History   Tobacco Use  . Smoking status: Current Some Day Smoker    Packs/day: 0.50  . Smokeless tobacco: Never Used  Substance Use Topics  . Alcohol use: No  . Drug use: No    Allergies:  Allergies  Allergen Reactions  . Morphine And Related Itching    Lower doses OK  . Penicillins Rash and Other (See Comments)    Childhood reaction ( unknown) Has patient had a PCN reaction causing immediate rash, facial/tongue/throat swelling, SOB or lightheadedness with hypotension: Yes Has patient had a PCN reaction  causing severe rash involving mucus membranes or skin necrosis: No Has patient had a PCN reaction that required hospitalization: No Has patient had a PCN reaction occurring within the last 10 years: No If all of the above answers are "NO", then may proceed with Cephalosporin use.     Medications Prior to Admission  Medication Sig Dispense Refill Last Dose  . acetaminophen (TYLENOL) 325 MG tablet Take 2 tablets (650 mg total) by mouth every 6 (six) hours as needed for moderate pain (Take 1-2 tablets every six hours as needed for pain). 30 tablet 0 Past Week at Unknown time  . albuterol (PROVENTIL HFA;VENTOLIN HFA) 108 (90 Base) MCG/ACT inhaler Inhale 1-2 puffs into the lungs every 6 (six) hours as needed for wheezing or shortness of breath.   More than a month at Unknown time  . doxylamine, Sleep, (UNISOM) 25 MG tablet Take 25 mg by mouth at bedtime as needed for sleep.   Past Month at Unknown time  . ondansetron (ZOFRAN-ODT) 8 MG disintegrating tablet Take 8 mg by mouth every 8 (eight) hours as  needed for nausea or vomiting.   Past Month at Unknown time  . Prenatal Vit-Fe Fumarate-FA (PRENATAL MULTIVITAMIN) TABS tablet Take 1 tablet by mouth daily at 12 noon.   10/31/2017 at Unknown time  . progesterone 200 MG SUPP Place 200 mg vaginally at bedtime.   10/31/2017 at Unknown time    Review of Systems  Gastrointestinal: Positive for abdominal pain and nausea. Negative for constipation, diarrhea and vomiting.  Genitourinary: Negative for dysuria, frequency, vaginal bleeding, vaginal discharge and vaginal pain.  All other systems reviewed and are negative.  Physical Exam   Vitals:   12/21/17 0706  BP: 129/81  Pulse: (!) 104  Resp: 20  Temp: 98.1 F (36.7 C)  TempSrc: Oral  Weight: 98.4 kg  Height: 5\' 9"  (1.753 m)    Physical Exam  Constitutional: She is oriented to person, place, and time. She appears well-developed and well-nourished.  HENT:  Head: Normocephalic.  Eyes: Pupils are  equal, round, and reactive to light.  Cardiovascular: Regular rhythm and normal heart sounds.  Respiratory: Effort normal and breath sounds normal.  GI: Soft. Bowel sounds are normal. She exhibits no distension. There is no tenderness.  Genitourinary: Vagina normal and uterus normal.  Musculoskeletal: Normal range of motion.  Neurological: She is alert and oriented to person, place, and time.  Skin: Skin is warm and dry.  Psychiatric: She has a normal mood and affect. Her behavior is normal. Judgment normal.   On speculum exam, cervix is closed and there is no bleeding noted in the vagina. There is thick white discharge adhered to vaginal walls. On bimanual exam, the cervix is closed, long, and firm. Patient has no cervical motion tenderness.  MAU Course  Procedures Urinalysis Wet Prep  MDM Urinalysis doesn't indicate significant dehydration, no signs or symptoms of UTI, and wet prep is negative. Cervix is long, thick, and closed. There is no vaginal bleeding. Heart tones were heard via doppler. Patient is stable. She reports that she is still having incidents of bilateral lower abdominal pain but that they are happening less often. Consulted Dr. Su Hiltoberts, will discharge patient home and suggest Tylenol OTC for pain. Patient to be seen next week in office for follow up.   Assessment and Plan  32 y.o. G3P0 at 278w6d Abdominal pain in pregnancy Tylenol OTC for pain [facility]   Janeece RiggersEllis K Syana Degraffenreid 12/21/2017, 8:23 AM

## 2017-12-21 NOTE — Discharge Instructions (Signed)

## 2017-12-21 NOTE — MAU Note (Signed)
PT SAYS SHE FEELS  SHARP PAIN IN HER  LOWER  ABD AT 0400.   THEN AGAIN AT 0500, THEN AGAIN EVERY 30 MIN.   PNC- [facility].  LAST SEX-  PAST WEEKEND    FEELS NAUSEA- BUT NO VOMITING

## 2018-01-03 DIAGNOSIS — O26899 Other specified pregnancy related conditions, unspecified trimester: Secondary | ICD-10-CM | POA: Diagnosis not present

## 2018-01-17 DIAGNOSIS — Z3A20 20 weeks gestation of pregnancy: Secondary | ICD-10-CM | POA: Diagnosis not present

## 2018-01-28 DIAGNOSIS — J069 Acute upper respiratory infection, unspecified: Secondary | ICD-10-CM | POA: Diagnosis not present

## 2018-02-11 DIAGNOSIS — M545 Low back pain: Secondary | ICD-10-CM | POA: Diagnosis not present

## 2018-02-14 DIAGNOSIS — M545 Low back pain: Secondary | ICD-10-CM | POA: Diagnosis not present

## 2018-02-15 DIAGNOSIS — O1492 Unspecified pre-eclampsia, second trimester: Secondary | ICD-10-CM | POA: Diagnosis not present

## 2018-02-15 DIAGNOSIS — M5416 Radiculopathy, lumbar region: Secondary | ICD-10-CM | POA: Diagnosis not present

## 2018-02-15 DIAGNOSIS — Z23 Encounter for immunization: Secondary | ICD-10-CM | POA: Diagnosis not present

## 2018-02-22 DIAGNOSIS — G5602 Carpal tunnel syndrome, left upper limb: Secondary | ICD-10-CM | POA: Diagnosis not present

## 2018-02-22 DIAGNOSIS — M545 Low back pain: Secondary | ICD-10-CM | POA: Diagnosis not present

## 2018-02-22 DIAGNOSIS — M25532 Pain in left wrist: Secondary | ICD-10-CM | POA: Diagnosis not present

## 2018-02-22 DIAGNOSIS — O139 Gestational [pregnancy-induced] hypertension without significant proteinuria, unspecified trimester: Secondary | ICD-10-CM | POA: Diagnosis not present

## 2018-02-22 DIAGNOSIS — Z3A26 26 weeks gestation of pregnancy: Secondary | ICD-10-CM | POA: Diagnosis not present

## 2018-02-22 DIAGNOSIS — Z369 Encounter for antenatal screening, unspecified: Secondary | ICD-10-CM | POA: Diagnosis not present

## 2018-02-24 ENCOUNTER — Other Ambulatory Visit (HOSPITAL_COMMUNITY): Payer: Self-pay | Admitting: Obstetrics & Gynecology

## 2018-02-24 DIAGNOSIS — Z363 Encounter for antenatal screening for malformations: Secondary | ICD-10-CM

## 2018-02-24 DIAGNOSIS — O132 Gestational [pregnancy-induced] hypertension without significant proteinuria, second trimester: Secondary | ICD-10-CM

## 2018-02-24 DIAGNOSIS — Z3A26 26 weeks gestation of pregnancy: Secondary | ICD-10-CM

## 2018-02-25 ENCOUNTER — Encounter (HOSPITAL_COMMUNITY): Payer: Self-pay

## 2018-02-25 ENCOUNTER — Ambulatory Visit (HOSPITAL_COMMUNITY)
Admission: RE | Admit: 2018-02-25 | Discharge: 2018-02-25 | Disposition: A | Discharge status: Home / Self Care | Payer: 59 | Source: Ambulatory Visit | Attending: Obstetrics & Gynecology | Admitting: Obstetrics & Gynecology

## 2018-02-25 ENCOUNTER — Ambulatory Visit (HOSPITAL_BASED_OUTPATIENT_CLINIC_OR_DEPARTMENT_OTHER)
Admission: RE | Admit: 2018-02-25 | Discharge: 2018-02-25 | Disposition: A | Discharge status: Home / Self Care | Payer: 59 | Source: Ambulatory Visit | Attending: Obstetrics & Gynecology | Admitting: Obstetrics & Gynecology

## 2018-02-25 DIAGNOSIS — O139 Gestational [pregnancy-induced] hypertension without significant proteinuria, unspecified trimester: Secondary | ICD-10-CM | POA: Diagnosis not present

## 2018-02-25 DIAGNOSIS — Z3A26 26 weeks gestation of pregnancy: Secondary | ICD-10-CM

## 2018-02-25 DIAGNOSIS — O1400 Mild to moderate pre-eclampsia, unspecified trimester: Secondary | ICD-10-CM

## 2018-02-25 DIAGNOSIS — Z363 Encounter for antenatal screening for malformations: Secondary | ICD-10-CM

## 2018-02-25 DIAGNOSIS — Z832 Family history of diseases of the blood and blood-forming organs and certain disorders involving the immune mechanism: Secondary | ICD-10-CM | POA: Diagnosis not present

## 2018-02-25 DIAGNOSIS — O99212 Obesity complicating pregnancy, second trimester: Secondary | ICD-10-CM

## 2018-02-25 DIAGNOSIS — O132 Gestational [pregnancy-induced] hypertension without significant proteinuria, second trimester: Secondary | ICD-10-CM | POA: Insufficient documentation

## 2018-02-25 LAB — COMPREHENSIVE METABOLIC PANEL
ALT: 14 U/L (ref 0–44)
AST: 17 U/L (ref 15–41)
Albumin: 3.2 g/dL — ABNORMAL LOW (ref 3.5–5.0)
Alkaline Phosphatase: 76 U/L (ref 38–126)
Anion gap: 10 (ref 5–15)
BILIRUBIN TOTAL: 0.5 mg/dL (ref 0.3–1.2)
BUN: <5 mg/dL — ABNORMAL LOW (ref 6–20)
CO2: 21 mmol/L — AB (ref 22–32)
CREATININE: 0.56 mg/dL (ref 0.44–1.00)
Calcium: 9.7 mg/dL (ref 8.9–10.3)
Chloride: 102 mmol/L (ref 98–111)
GFR calc non Af Amer: >60 mL/min (ref >60)
GLUCOSE: 140 mg/dL — AB (ref 70–99)
Potassium: 3.9 mmol/L (ref 3.5–5.1)
SODIUM: 133 mmol/L — AB (ref 135–145)
Total Protein: 6.5 g/dL (ref 6.5–8.1)

## 2018-02-25 NOTE — Consult Note (Signed)
Consultation:    [name] is a 32 yo African American female, G 3 P 0020 LMP 08/23/17 EDC 06/01/18 now @ 26 2/7 weeks seen in consultation as requested secondary to:   1) Pre-eclampsia - patient denies headaches, visual disturbance, mid-epigastric or RUQ pain. Patient was prescribed Nifedipine 30 mg XL which she has not yet started.  128-147/72-87.  PREVIOUS OBSTETRICAL HISTORY:  1) 2007 - First trimester ETOP without complications 2) 2009 - First trimester ETOP without complications      PREVIOUS GYN HISTORY:   Abnormal PAP - 2019 - colposcopy   GC - neg   Chlamydia - neg    Syphilis - neg   CAT - 16 x 28-30 x 5   Contraception - none    PREVIOUS MEDICAL HISTORY:  DM - neg   HTN - neg   Asthma -  2014   Thyroid - neg   Rheumatic Fever - neg    Heart - neg   Lung - neg   Liver - neg   Kidney - neg   Epilepsy - neg    TB - neg   Herpes - positive   UTI - neg        PREVIOUS SURGICAL HISTORY:  1) 2014 - Microdiscectomy without complications 2) 2016 - Anterior spinal fusion without complications 3) 2017 - Anterior spinal fusion without complications    MEDICATIONS:  Prenatal Vitamins, Albuterol PRN       ALLERGIES/REACTIONS:  PCN - rash      HABITS:  Smoking - neg   Drinking - neg   Drugs - neg    PSYCHOSOCIAL:  Single; FOB is involved      PROFESSION:   [employer]   FAMILY HISTORY:  DM - GM    HTN - F, M   Twins - neg   Stillborns - neg    Birth Defects - neg   Mental Retardation - neg   Blood Dyscrasias - neg   Anesthesia Complications - delayed waking    Genetic - neg          PRE-ECLAMPSIA/PIH: General counseling regarding pre-eclampsia/PIH/Gestational Hypertension was performed.  A description of the criteria for severe pre-eclampsia was given along with its management at this gestational age.  The role of Magnesium sulfate in seizure prophylaxis was outlined.   Patients with mild pre-eclampsia/PIH/Gestational Hypertension should be delivered at 37  weeks based on the HYPITAT multicenter trial.  This trial showed that pre-eclamptic women benefited from early intervention, without incurring an increased risk of operative delivery or neonatal morbidity. The trial was not large enough to determine whether small differences in newborn outcomes or induction between 36 and 37 weeks might be statistically significant. A follow-up economic analysis of this trial concluded induction was also less costly overall than expectant management with monitoring. MgSO4 if given, should be initiated as a 4-6 gram bolus over 20 (4 gram bolus) to 30 minutes (6 gram bolus) (a faster therapeutic level will be obtained if a 6 gram bolus is utilized) followed by 2-2  grams per hour continuous infusion. Therapeutic Magnesium levels are 5 to 8 mg percent.  If Magnesium levels are sub-therapeutic, a rebolus of 2 grams over 10 minutes will often correct the Magnesium level.  If levels are on the lower end of the therapeutic range, increasing the hourly rate by  gram per hour often suffices.  Magnesium levels may be checked every 6 hours if necessary.    In order to acutely lower  BP, Hydralazine IV or Labetalol IV may be used.  Hydralazine may be given as 5-10 mg IV increments every 15 - 20 minutes, if BP is not significantly reduced, 20 mg IV Labetalol should be used or alternatively started with.  IV Labetalol may be given as 20 mg then 40 mg then 80 mg every 10 -15 minutes (80 mg being the largest individual dose) up to a total IV dose of 300 mg.  If necessary, a continuous IV Labetalol drip may be utilized. Immediate release oral Nifedipine 10 mg PO followed by 20 mg every 20 minutes may also be utilized as a first line treatment to maintain Systolic BP < 160 and/or diastolic BP < 110.  Maximum daily dose is 180 mg.  If after 50 mg PO immediate release Nifedipine has been given and BP remains greater than 160 for systolic BP or 110 for diastolic, Labetalol or Hydralazine should be  utilized.  IMPRESSIONS:  1) Mild pre-eclampsia    RECOMMENDATIONS:  1) Baseline 24 hr. urine collection for protein, volume, creatinine and creatinine clearance 2) Weekly PIH labs to be done by OB 3) Weekly BPP and BP check 4) Delivery @ 37 weeks; deliver earlier if maternal or fetal deterioration 5) Pseudocholinesterase evaluation            30 minutes spent in face-to-face consultation with greater than 50% of the time spent in counseling.    Thank you for utilizing our ultrasound and consultative services.  If I may be of any further service, please do not hesitate to contact me.  Sincerely,   Patsi Sears, MD Maternal-Fetal Medicine

## 2018-02-28 ENCOUNTER — Telehealth (HOSPITAL_COMMUNITY): Payer: Self-pay | Admitting: Obstetrics & Gynecology

## 2018-02-28 ENCOUNTER — Other Ambulatory Visit (HOSPITAL_COMMUNITY): Payer: Self-pay | Admitting: *Deleted

## 2018-02-28 ENCOUNTER — Ambulatory Visit (HOSPITAL_COMMUNITY)
Admission: RE | Admit: 2018-02-28 | Discharge: 2018-02-28 | Disposition: A | Discharge status: Home / Self Care | Payer: 59 | Source: Ambulatory Visit | Attending: Obstetrics & Gynecology | Admitting: Obstetrics & Gynecology

## 2018-02-28 DIAGNOSIS — O1400 Mild to moderate pre-eclampsia, unspecified trimester: Secondary | ICD-10-CM

## 2018-02-28 DIAGNOSIS — O139 Gestational [pregnancy-induced] hypertension without significant proteinuria, unspecified trimester: Secondary | ICD-10-CM | POA: Diagnosis not present

## 2018-02-28 DIAGNOSIS — Z3A26 26 weeks gestation of pregnancy: Secondary | ICD-10-CM | POA: Diagnosis not present

## 2018-02-28 DIAGNOSIS — O132 Gestational [pregnancy-induced] hypertension without significant proteinuria, second trimester: Secondary | ICD-10-CM

## 2018-02-28 DIAGNOSIS — O1402 Mild to moderate pre-eclampsia, second trimester: Secondary | ICD-10-CM | POA: Insufficient documentation

## 2018-02-28 DIAGNOSIS — O99810 Abnormal glucose complicating pregnancy: Secondary | ICD-10-CM | POA: Diagnosis not present

## 2018-02-28 LAB — PROTEIN, URINE, 24 HOUR
Collection Interval-UPROT: 24 hours
Protein, 24H Urine: 402 mg/d — ABNORMAL HIGH (ref 50–100)
Protein, Urine: 22 mg/dL
URINE TOTAL VOLUME-UPROT: 1825 mL

## 2018-02-28 LAB — CREATININE CLEARANCE, URINE, 24 HOUR
CREATININE 24H UR: 1916 mg/d — AB (ref 600–1800)
Collection Interval-CRCL: 24 hours
Creatinine Clearance: 238 mL/min — ABNORMAL HIGH (ref 75–115)
Creatinine, Urine: 104.99 mg/dL
Urine Total Volume-CRCL: 1825 mL

## 2018-02-28 NOTE — Telephone Encounter (Signed)
Left message with results and interpretation for 24 hr urine and cholinesterase results: normal cholinesterase; 401 mg proteinuria (c/w mild p-re-eclampsia; CrCl - 238 - normal renal function.

## 2018-03-01 DIAGNOSIS — M545 Low back pain: Secondary | ICD-10-CM | POA: Diagnosis not present

## 2018-03-01 DIAGNOSIS — O339 Maternal care for disproportion, unspecified: Secondary | ICD-10-CM | POA: Diagnosis not present

## 2018-03-01 DIAGNOSIS — M5442 Lumbago with sciatica, left side: Secondary | ICD-10-CM | POA: Diagnosis not present

## 2018-03-03 ENCOUNTER — Encounter (HOSPITAL_COMMUNITY): Payer: Self-pay | Admitting: *Deleted

## 2018-03-03 ENCOUNTER — Inpatient Hospital Stay (EMERGENCY_DEPARTMENT_HOSPITAL)
Admission: AD | Admit: 2018-03-03 | Discharge: 2018-03-03 | Disposition: A | Discharge status: Home / Self Care | Payer: 59 | Source: Ambulatory Visit | Attending: Obstetrics and Gynecology | Admitting: Obstetrics and Gynecology

## 2018-03-03 DIAGNOSIS — R51 Headache: Secondary | ICD-10-CM

## 2018-03-03 DIAGNOSIS — O1492 Unspecified pre-eclampsia, second trimester: Secondary | ICD-10-CM

## 2018-03-03 DIAGNOSIS — Z3A27 27 weeks gestation of pregnancy: Secondary | ICD-10-CM | POA: Diagnosis not present

## 2018-03-03 DIAGNOSIS — Z880 Allergy status to penicillin: Secondary | ICD-10-CM | POA: Insufficient documentation

## 2018-03-03 DIAGNOSIS — Z87891 Personal history of nicotine dependence: Secondary | ICD-10-CM | POA: Insufficient documentation

## 2018-03-03 DIAGNOSIS — O26892 Other specified pregnancy related conditions, second trimester: Secondary | ICD-10-CM

## 2018-03-03 DIAGNOSIS — R519 Headache, unspecified: Secondary | ICD-10-CM

## 2018-03-03 LAB — COMPREHENSIVE METABOLIC PANEL
ALT: 14 U/L (ref 0–44)
AST: 18 U/L (ref 15–41)
Albumin: 3.1 g/dL — ABNORMAL LOW (ref 3.5–5.0)
Alkaline Phosphatase: 81 U/L (ref 38–126)
Anion gap: 10 (ref 5–15)
BILIRUBIN TOTAL: 0.6 mg/dL (ref 0.3–1.2)
CALCIUM: 9.3 mg/dL (ref 8.9–10.3)
CHLORIDE: 104 mmol/L (ref 98–111)
CO2: 20 mmol/L — ABNORMAL LOW (ref 22–32)
CREATININE: 0.46 mg/dL (ref 0.44–1.00)
Glucose, Bld: 126 mg/dL — ABNORMAL HIGH (ref 70–99)
Potassium: 3.5 mmol/L (ref 3.5–5.1)
SODIUM: 134 mmol/L — AB (ref 135–145)
TOTAL PROTEIN: 7 g/dL (ref 6.5–8.1)

## 2018-03-03 LAB — URINALYSIS, ROUTINE W REFLEX MICROSCOPIC
Bilirubin Urine: NEGATIVE
Hgb urine dipstick: NEGATIVE
Ketones, ur: NEGATIVE mg/dL
Leukocytes, UA: NEGATIVE
Nitrite: NEGATIVE
PROTEIN: NEGATIVE mg/dL
SPECIFIC GRAVITY, URINE: 1.008 (ref 1.005–1.030)
pH: 6 (ref 5.0–8.0)

## 2018-03-03 LAB — CBC
HCT: 35.6 % — ABNORMAL LOW (ref 36.0–46.0)
HEMOGLOBIN: 12.4 g/dL (ref 12.0–15.0)
MCH: 30.6 pg (ref 26.0–34.0)
MCHC: 34.8 g/dL (ref 30.0–36.0)
MCV: 87.9 fL (ref 80.0–100.0)
Platelets: 292 10*3/uL (ref 150–400)
RBC: 4.05 MIL/uL (ref 3.87–5.11)
RDW: 13.1 % (ref 11.5–15.5)
WBC: 7.2 10*3/uL (ref 4.0–10.5)
nRBC: 0 % (ref 0.0–0.2)

## 2018-03-03 LAB — PROTEIN / CREATININE RATIO, URINE
Creatinine, Urine: 80 mg/dL
Protein Creatinine Ratio: 0.28 mg/mg{Cre} — ABNORMAL HIGH (ref 0.00–0.15)
TOTAL PROTEIN, URINE: 22 mg/dL

## 2018-03-03 MED ORDER — BUTALBITAL-APAP-CAFFEINE 50-325-40 MG PO TABS
2.0000 | ORAL_TABLET | Freq: Once | ORAL | Status: AC
  Administered 2018-03-03: 2 via ORAL
  Filled 2018-03-03: qty 2

## 2018-03-03 MED ORDER — BETAMETHASONE SOD PHOS & ACET 6 (3-3) MG/ML IJ SUSP
12.0000 mg | INTRAMUSCULAR | Status: DC
  Administered 2018-03-03: 12 mg via INTRAMUSCULAR
  Filled 2018-03-03 (×2): qty 2

## 2018-03-03 NOTE — MAU Note (Signed)
Pt states she was in the office to get BMZ and b/p was elevated. Started procardia yesterday, headache since yesterday.

## 2018-03-03 NOTE — MAU Provider Note (Signed)
History     CSN: [id number]  Arrival date and time: 03/03/18 1126   First Provider Initiated Contact with Patient 03/03/18 1223      Chief Complaint  Patient presents with  . Headache  . Hypertension   G3P0020 @27 .1 wks sent from office for HA. Reports peri-orbital HA since this am. HA worse with light. Rates 7/10. She has not taken anything for it. Denies visual disturbances and epigastric pain. Good FM. No VB, LOF, or ctx. She was dx with PEC 3 days ago and started on Procardia yesterday.    OB History    Gravida  3   Para  0   Term  0   Preterm  0   AB  2   Living  0     SAB  0   TAB  2   Ectopic  0   Multiple  0   Live Births              Past Medical History:  Diagnosis Date  . Asthma   . No pertinent past medical history   . Ovarian cyst     Past Surgical History:  Procedure Laterality Date  . BACK SURGERY    . DILATION AND CURETTAGE OF UTERUS    . INDUCED ABORTION     x2  . laporocscopy      Family History  Problem Relation Age of Onset  . Hypertension Mother   . Hypertension Father   . Anesthesia problems Neg Hx   . Hypotension Neg Hx   . Malignant hyperthermia Neg Hx   . Pseudochol deficiency Neg Hx     Social History   Tobacco Use  . Smoking status: Former Smoker    Packs/day: 0.50    Last attempt to quit: 02/26/2016    Years since quitting: 2.0  . Smokeless tobacco: Never Used  Substance Use Topics  . Alcohol use: No  . Drug use: No    Allergies:  Allergies  Allergen Reactions  . Latex Itching  . Morphine And Related Itching    Lower doses OK  . Penicillins Rash and Other (See Comments)    Childhood reaction ( unknown) Has patient had a PCN reaction causing immediate rash, facial/tongue/throat swelling, SOB or lightheadedness with hypotension: Yes Has patient had a PCN reaction causing severe rash involving mucus membranes or skin necrosis: No Has patient had a PCN reaction that required hospitalization:  No Has patient had a PCN reaction occurring within the last 10 years: No If all of the above answers are "NO", then may proceed with Cephalosporin use.     Medications Prior to Admission  Medication Sig Dispense Refill Last Dose  . acetaminophen (TYLENOL) 325 MG tablet Take 2 tablets (650 mg total) by mouth every 6 (six) hours as needed for moderate pain (Take 1-2 tablets every six hours as needed for pain). 30 tablet 0 Past Month at Unknown time  . NIFEdipine (PROCARDIA-XL/NIFEDICAL-XL) 30 MG 24 hr tablet Take 30 mg by mouth at bedtime.   03/02/2018 at Unknown time  . Prenatal Vit-Fe Fumarate-FA (PRENATAL MULTIVITAMIN) TABS tablet Take 1 tablet by mouth daily at 12 noon.   03/02/2018 at Unknown time  . albuterol (PROVENTIL HFA;VENTOLIN HFA) 108 (90 Base) MCG/ACT inhaler Inhale 1-2 puffs into the lungs every 6 (six) hours as needed for wheezing or shortness of breath.   Rescue    Review of Systems  Constitutional: Negative for fever.  Eyes: Negative for visual disturbance.  Respiratory: Negative for cough and shortness of breath.   Cardiovascular: Negative for chest pain.  Gastrointestinal: Negative for abdominal pain.  Genitourinary: Negative for vaginal bleeding.  Neurological: Positive for headaches.   Physical Exam   Blood pressure 127/83, pulse 96, temperature 98.1 F (36.7 C), temperature source Oral, resp. rate 18, height 5\' 9"  (1.753 m), weight 103.9 kg, last menstrual period 08/25/2017, SpO2 100 %. Patient Vitals for the past 24 hrs:  BP Temp Temp src Pulse Resp SpO2 Height Weight  03/03/18 1400 127/83 - - 96 - - - -  03/03/18 1345 134/88 - - 98 - - - -  03/03/18 1330 (!) 141/82 - - 94 - - - -  03/03/18 1300 125/86 - - 91 - - - -  03/03/18 1245 132/89 - - 95 - - - -  03/03/18 1230 127/83 - - 99 - - - -  03/03/18 1215 114/73 - - (!) 103 - - - -  03/03/18 1200 134/74 - - (!) 108 - - - -  03/03/18 1152 126/75 - - (!) 104 - - - -  03/03/18 1136 (!) 148/93 98.1 F (36.7 C)  Oral (!) 110 18 100 % 5\' 9"  (1.753 m) 103.9 kg   Physical Exam  Nursing note and vitals reviewed. Constitutional: She is oriented to person, place, and time. She appears well-developed and well-nourished. No distress.  HENT:  Head: Normocephalic and atraumatic.  Neck: Normal range of motion.  Cardiovascular: Normal rate.  Respiratory: Effort normal. No respiratory distress.  Musculoskeletal: Normal range of motion. She exhibits no edema.  Neurological: She is alert and oriented to person, place, and time.  Skin: Skin is warm and dry.  Psychiatric: She has a normal mood and affect.  EFM: 135 bpm, mod variability, + accels, no decels Toco: irritability  Results for orders placed or performed during the hospital encounter of 03/03/18 (from the past 24 hour(s))  Urinalysis, Routine w reflex microscopic     Status: Abnormal   Collection Time: 03/03/18 11:51 AM  Result Value Ref Range   Color, Urine YELLOW YELLOW   APPearance HAZY (A) CLEAR   Specific Gravity, Urine 1.008 1.005 - 1.030   pH 6.0 5.0 - 8.0   Glucose, UA >=500 (A) NEGATIVE mg/dL   Hgb urine dipstick NEGATIVE NEGATIVE   Bilirubin Urine NEGATIVE NEGATIVE   Ketones, ur NEGATIVE NEGATIVE mg/dL   Protein, ur NEGATIVE NEGATIVE mg/dL   Nitrite NEGATIVE NEGATIVE   Leukocytes, UA NEGATIVE NEGATIVE   RBC / HPF 0-5 0 - 5 RBC/hpf   WBC, UA 6-10 0 - 5 WBC/hpf   Bacteria, UA FEW (A) NONE SEEN   Squamous Epithelial / LPF 0-5 0 - 5   Mucus PRESENT   Protein / creatinine ratio, urine     Status: Abnormal   Collection Time: 03/03/18 11:51 AM  Result Value Ref Range   Creatinine, Urine 80.00 mg/dL   Total Protein, Urine 22 mg/dL   Protein Creatinine Ratio 0.28 (H) 0.00 - 0.15 mg/mg[Cre]  CBC     Status: Abnormal   Collection Time: 03/03/18 12:32 PM  Result Value Ref Range   WBC 7.2 4.0 - 10.5 K/uL   RBC 4.05 3.87 - 5.11 MIL/uL   Hemoglobin 12.4 12.0 - 15.0 g/dL   HCT 16.1 (L) 09.6 - 04.5 %   MCV 87.9 80.0 - 100.0 fL   MCH 30.6  26.0 - 34.0 pg   MCHC 34.8 30.0 - 36.0 g/dL   RDW 40.9 81.1 -  15.5 %   Platelets 292 150 - 400 K/uL   nRBC 0.0 0.0 - 0.2 %  Comprehensive metabolic panel     Status: Abnormal   Collection Time: 03/03/18 12:32 PM  Result Value Ref Range   Sodium 134 (L) 135 - 145 mmol/L   Potassium 3.5 3.5 - 5.1 mmol/L   Chloride 104 98 - 111 mmol/L   CO2 20 (L) 22 - 32 mmol/L   Glucose, Bld 126 (H) 70 - 99 mg/dL   BUN <5 (L) 6 - 20 mg/dL   Creatinine, Ser 1.61 0.44 - 1.00 mg/dL   Calcium 9.3 8.9 - 09.6 mg/dL   Total Protein 7.0 6.5 - 8.1 g/dL   Albumin 3.1 (L) 3.5 - 5.0 g/dL   AST 18 15 - 41 U/L   ALT 14 0 - 44 U/L   Alkaline Phosphatase 81 38 - 126 U/L   Total Bilirubin 0.6 0.3 - 1.2 mg/dL   GFR calc non Af Amer >60 >60 mL/min   GFR calc Af Amer >60 >60 mL/min   Anion gap 10 5 - 15   MAU Course  Procedures Fioricet  MDM Labs ordered and reviewed. BMZ ordered per request of Dr. Estanislado Pandy (in records). HA improved. No severe range BPs. No evidence of severe features. Will return tomorrow to MAU for BMZ #2-ordered. Strict return precautions. Stable for discharge home.  Assessment and Plan   1. [redacted] weeks gestation of pregnancy   2. Preeclampsia, second trimester   3. Pregnancy headache in second trimester    Discharge home Follow up at [facility] next week as scheduled Follow up at [facility]FM tomorrow as scheduled Strict return precautions Tylenol prn  Allergies as of 03/03/2018      Reactions   Latex Itching   Morphine And Related Itching   Lower doses OK   Penicillins Rash, Other (See Comments)   Childhood reaction ( unknown) Has patient had a PCN reaction causing immediate rash, facial/tongue/throat swelling, SOB or lightheadedness with hypotension: Yes Has patient had a PCN reaction causing severe rash involving mucus membranes or skin necrosis: No Has patient had a PCN reaction that required hospitalization: No Has patient had a PCN reaction occurring within the last 10 years: No If all of  the above answers are "NO", then may proceed with Cephalosporin use.      Medication List    TAKE these medications   acetaminophen 325 MG tablet Commonly known as:  TYLENOL Take 2 tablets (650 mg total) by mouth every 6 (six) hours as needed for moderate pain (Take 1-2 tablets every six hours as needed for pain).   albuterol 108 (90 Base) MCG/ACT inhaler Commonly known as:  PROVENTIL HFA;VENTOLIN HFA Inhale 1-2 puffs into the lungs every 6 (six) hours as needed for wheezing or shortness of breath.   NIFEdipine 30 MG 24 hr tablet Commonly known as:  PROCARDIA-XL/NIFEDICAL-XL Take 30 mg by mouth at bedtime.   prenatal multivitamin Tabs tablet Take 1 tablet by mouth daily at 12 noon.      Donette Larry, CNM 03/03/2018, 2:23 PM

## 2018-03-03 NOTE — Discharge Instructions (Signed)
Preeclampsia and Eclampsia °Preeclampsia is a serious condition that develops only during pregnancy. It is also called toxemia of pregnancy. This condition causes high blood pressure along with other symptoms, such as swelling and headaches. These symptoms may develop as the condition gets worse. Preeclampsia may occur at 20 weeks of pregnancy or later. °Diagnosing and treating preeclampsia early is very important. If not treated early, it can cause serious problems for you and your baby. One problem it can lead to is eclampsia, which is a condition that causes muscle jerking or shaking (convulsions or seizures) in the mother. Delivering your baby is the best treatment for preeclampsia or eclampsia. Preeclampsia and eclampsia symptoms usually go away after your baby is born. °What are the causes? °The cause of preeclampsia is not known. °What increases the risk? °The following risk factors make you more likely to develop preeclampsia: °· Being pregnant for the first time. °· Having had preeclampsia during a past pregnancy. °· Having a family history of preeclampsia. °· Having high blood pressure. °· Being pregnant with twins or triplets. °· Being 35 or older. °· Being African-American. °· Having kidney disease or diabetes. °· Having medical conditions such as lupus or blood diseases. °· Being very overweight (obese). ° °What are the signs or symptoms? °The earliest signs of preeclampsia are: °· High blood pressure. °· Increased protein in your urine. Your health care provider will check for this at every visit before you give birth (prenatal visit). ° °Other symptoms that may develop as the condition gets worse include: °· Severe headaches. °· Sudden weight gain. °· Swelling of the hands, face, legs, and feet. °· Nausea and vomiting. °· Vision problems, such as blurred or double vision. °· Numbness in the face, arms, legs, and feet. °· Urinating less than usual. °· Dizziness. °· Slurred speech. °· Abdominal pain,  especially upper abdominal pain. °· Convulsions or seizures. ° °Symptoms generally go away after giving birth. °How is this diagnosed? °There are no screening tests for preeclampsia. Your health care provider will ask you about symptoms and check for signs of preeclampsia during your prenatal visits. You may also have tests that include: °· Urine tests. °· Blood tests. °· Checking your blood pressure. °· Monitoring your baby’s heart rate. °· Ultrasound. ° °How is this treated? °You and your health care provider will determine the treatment approach that is best for you. Treatment may include: °· Having more frequent prenatal exams to check for signs of preeclampsia, if you have an increased risk for preeclampsia. °· Bed rest. °· Reducing how much salt (sodium) you eat. °· Medicine to lower your blood pressure. °· Staying in the hospital, if your condition is severe. There, treatment will focus on controlling your blood pressure and the amount of fluids in your body (fluid retention). °· You may need to take medicine (magnesium sulfate) to prevent seizures. This medicine may be given as an injection or through an IV tube. °· Delivering your baby early, if your condition gets worse. You may have your labor started with medicine (induced), or you may have a cesarean delivery. ° °Follow these instructions at home: °Eating and drinking ° °· Drink enough fluid to keep your urine clear or pale yellow. °· Eat a healthy diet that is low in sodium. Do not add salt to your food. Check nutrition labels to see how much sodium a food or beverage contains. °· Avoid caffeine. °Lifestyle °· Do not use any products that contain nicotine or tobacco, such as cigarettes   and e-cigarettes. If you need help quitting, ask your health care provider. °· Do not use alcohol or drugs. °· Avoid stress as much as possible. Rest and get plenty of sleep. °General instructions °· Take over-the-counter and prescription medicines only as told by your  health care provider. °· When lying down, lie on your side. This keeps pressure off of your baby. °· When sitting or lying down, raise (elevate) your feet. Try putting some pillows underneath your lower legs. °· Exercise regularly. Ask your health care provider what kinds of exercise are best for you. °· Keep all follow-up and prenatal visits as told by your health care provider. This is important. °How is this prevented? °To prevent preeclampsia or eclampsia from developing during another pregnancy: °· Get proper medical care during pregnancy. Your health care provider may be able to prevent preeclampsia or diagnose and treat it early. °· Your health care provider may have you take a low-dose aspirin or a calcium supplement during your next pregnancy. °· You may have tests of your blood pressure and kidney function after giving birth. °· Maintain a healthy weight. Ask your health care provider for help managing weight gain during pregnancy. °· Work with your health care provider to manage any long-term (chronic) health conditions you have, such as diabetes or kidney problems. ° °Contact a health care provider if: °· You gain more weight than expected. °· You have headaches. °· You have nausea or vomiting. °· You have abdominal pain. °· You feel dizzy or light-headed. °Get help right away if: °· You develop sudden or severe swelling anywhere in your body. This usually happens in the legs. °· You gain 5 lbs (2.3 kg) or more during one week. °· You have severe: °? Abdominal pain. °? Headaches. °? Dizziness. °? Vision problems. °? Confusion. °? Nausea or vomiting. °· You have a seizure. °· You have trouble moving any part of your body. °· You develop numbness in any part of your body. °· You have trouble speaking. °· You have any abnormal bleeding. °· You pass out. °This information is not intended to replace advice given to you by your health care provider. Make sure you discuss any questions you have with your health  care provider. °Document Released: 04/24/2000 Document Revised: 12/24/2015 Document Reviewed: 12/02/2015 °Elsevier Interactive Patient Education © 2018 [employer] ° °

## 2018-03-04 ENCOUNTER — Other Ambulatory Visit: Payer: Self-pay

## 2018-03-04 ENCOUNTER — Inpatient Hospital Stay (HOSPITAL_COMMUNITY)
Admission: AD | Admit: 2018-03-04 | Discharge: 2018-03-04 | Disposition: A | Discharge status: Home / Self Care | Payer: 59 | Source: Ambulatory Visit | Attending: Obstetrics & Gynecology | Admitting: Obstetrics & Gynecology

## 2018-03-04 ENCOUNTER — Ambulatory Visit (HOSPITAL_BASED_OUTPATIENT_CLINIC_OR_DEPARTMENT_OTHER)
Admission: RE | Admit: 2018-03-04 | Discharge: 2018-03-04 | Disposition: A | Discharge status: Home / Self Care | Payer: 59 | Source: Ambulatory Visit | Attending: Obstetrics & Gynecology | Admitting: Obstetrics & Gynecology

## 2018-03-04 ENCOUNTER — Encounter (HOSPITAL_COMMUNITY): Payer: Self-pay | Admitting: *Deleted

## 2018-03-04 ENCOUNTER — Encounter (HOSPITAL_COMMUNITY): Payer: Self-pay

## 2018-03-04 ENCOUNTER — Inpatient Hospital Stay (HOSPITAL_COMMUNITY)
Admission: AD | Admit: 2018-03-04 | Discharge: 2018-03-10 | DRG: 833 | Disposition: A | Discharge status: Home / Self Care | Payer: 59 | Attending: Obstetrics and Gynecology | Admitting: Obstetrics and Gynecology

## 2018-03-04 DIAGNOSIS — Z87891 Personal history of nicotine dependence: Secondary | ICD-10-CM

## 2018-03-04 DIAGNOSIS — O133 Gestational [pregnancy-induced] hypertension without significant proteinuria, third trimester: Secondary | ICD-10-CM

## 2018-03-04 DIAGNOSIS — G4700 Insomnia, unspecified: Secondary | ICD-10-CM | POA: Diagnosis present

## 2018-03-04 DIAGNOSIS — O1412 Severe pre-eclampsia, second trimester: Secondary | ICD-10-CM | POA: Diagnosis present

## 2018-03-04 DIAGNOSIS — O99352 Diseases of the nervous system complicating pregnancy, second trimester: Secondary | ICD-10-CM | POA: Diagnosis present

## 2018-03-04 DIAGNOSIS — O24119 Pre-existing diabetes mellitus, type 2, in pregnancy, unspecified trimester: Secondary | ICD-10-CM | POA: Diagnosis not present

## 2018-03-04 DIAGNOSIS — O1402 Mild to moderate pre-eclampsia, second trimester: Secondary | ICD-10-CM

## 2018-03-04 DIAGNOSIS — O139 Gestational [pregnancy-induced] hypertension without significant proteinuria, unspecified trimester: Secondary | ICD-10-CM

## 2018-03-04 DIAGNOSIS — O1403 Mild to moderate pre-eclampsia, third trimester: Secondary | ICD-10-CM | POA: Diagnosis not present

## 2018-03-04 DIAGNOSIS — O1492 Unspecified pre-eclampsia, second trimester: Secondary | ICD-10-CM | POA: Diagnosis not present

## 2018-03-04 DIAGNOSIS — O99212 Obesity complicating pregnancy, second trimester: Secondary | ICD-10-CM

## 2018-03-04 DIAGNOSIS — Z3A27 27 weeks gestation of pregnancy: Secondary | ICD-10-CM

## 2018-03-04 DIAGNOSIS — O24414 Gestational diabetes mellitus in pregnancy, insulin controlled: Secondary | ICD-10-CM | POA: Diagnosis present

## 2018-03-04 DIAGNOSIS — R51 Headache: Secondary | ICD-10-CM | POA: Diagnosis not present

## 2018-03-04 DIAGNOSIS — O24419 Gestational diabetes mellitus in pregnancy, unspecified control: Secondary | ICD-10-CM | POA: Diagnosis not present

## 2018-03-04 DIAGNOSIS — Z3A28 28 weeks gestation of pregnancy: Secondary | ICD-10-CM | POA: Diagnosis not present

## 2018-03-04 DIAGNOSIS — Z23 Encounter for immunization: Secondary | ICD-10-CM

## 2018-03-04 LAB — COMPREHENSIVE METABOLIC PANEL
ALK PHOS: 78 U/L (ref 38–126)
ALT: 14 U/L (ref 0–44)
AST: 21 U/L (ref 15–41)
Albumin: 3.4 g/dL — ABNORMAL LOW (ref 3.5–5.0)
Anion gap: 13 (ref 5–15)
BILIRUBIN TOTAL: 0.6 mg/dL (ref 0.3–1.2)
BUN: 8 mg/dL (ref 6–20)
CALCIUM: 9.4 mg/dL (ref 8.9–10.3)
CO2: 19 mmol/L — AB (ref 22–32)
CREATININE: 0.66 mg/dL (ref 0.44–1.00)
Chloride: 101 mmol/L (ref 98–111)
Glucose, Bld: 209 mg/dL — ABNORMAL HIGH (ref 70–99)
Potassium: 4.1 mmol/L (ref 3.5–5.1)
SODIUM: 133 mmol/L — AB (ref 135–145)
Total Protein: 7 g/dL (ref 6.5–8.1)

## 2018-03-04 LAB — CBC
HEMATOCRIT: 35.1 % — AB (ref 36.0–46.0)
HEMOGLOBIN: 12.3 g/dL (ref 12.0–15.0)
MCH: 31.2 pg (ref 26.0–34.0)
MCHC: 35 g/dL (ref 30.0–36.0)
MCV: 89.1 fL (ref 80.0–100.0)
Platelets: 332 10*3/uL (ref 150–400)
RBC: 3.94 MIL/uL (ref 3.87–5.11)
RDW: 13 % (ref 11.5–15.5)
WBC: 11.8 10*3/uL — ABNORMAL HIGH (ref 4.0–10.5)
nRBC: 0 % (ref 0.0–0.2)

## 2018-03-04 LAB — PROTEIN / CREATININE RATIO, URINE
Creatinine, Urine: 223 mg/dL
Protein Creatinine Ratio: 0.28 mg/mg{Cre} — ABNORMAL HIGH (ref 0.00–0.15)
Total Protein, Urine: 63 mg/dL

## 2018-03-04 LAB — TYPE AND SCREEN
ABO/RH(D): A POS
Antibody Screen: NEGATIVE

## 2018-03-04 LAB — URIC ACID: Uric Acid, Serum: 4.7 mg/dL (ref 2.5–7.1)

## 2018-03-04 LAB — GLUCOSE, CAPILLARY: Glucose-Capillary: 285 mg/dL — ABNORMAL HIGH (ref 70–99)

## 2018-03-04 MED ORDER — ZOLPIDEM TARTRATE 5 MG PO TABS
5.0000 mg | ORAL_TABLET | Freq: Every evening | ORAL | Status: DC | PRN
  Administered 2018-03-04: 5 mg via ORAL
  Filled 2018-03-04: qty 1

## 2018-03-04 MED ORDER — SODIUM CHLORIDE 0.9 % IV SOLN: 250.0000 mL | INTRAVENOUS | Status: DC | PRN

## 2018-03-04 MED ORDER — SODIUM CHLORIDE 0.9% FLUSH
3.0000 mL | INTRAVENOUS | Status: DC | PRN
  Administered 2018-03-04 – 2018-03-05 (×2): 3 mL via INTRAVENOUS
  Filled 2018-03-04 (×2): qty 3

## 2018-03-04 MED ORDER — INSULIN ASPART 100 UNIT/ML ~~LOC~~ SOLN
6.0000 [IU] | Freq: Once | SUBCUTANEOUS | Status: AC
  Administered 2018-03-04: 6 [IU] via SUBCUTANEOUS

## 2018-03-04 MED ORDER — INFLUENZA VAC SPLIT QUAD 0.5 ML IM SUSY
0.5000 mL | PREFILLED_SYRINGE | INTRAMUSCULAR | Status: AC
  Administered 2018-03-05: 0.5 mL via INTRAMUSCULAR
  Filled 2018-03-04: qty 0.5

## 2018-03-04 MED ORDER — ACETAMINOPHEN 500 MG PO TABS
1000.0000 mg | ORAL_TABLET | Freq: Four times a day (QID) | ORAL | Status: DC | PRN
  Administered 2018-03-04 – 2018-03-08 (×2): 1000 mg via ORAL
  Filled 2018-03-04 (×3): qty 2

## 2018-03-04 MED ORDER — PRENATAL MULTIVITAMIN CH
1.0000 | ORAL_TABLET | Freq: Every day | ORAL | Status: DC
  Administered 2018-03-06: 1 via ORAL
  Filled 2018-03-04 (×6): qty 1

## 2018-03-04 MED ORDER — SODIUM CHLORIDE 0.9% FLUSH
3.0000 mL | Freq: Two times a day (BID) | INTRAVENOUS | Status: DC
  Administered 2018-03-05 – 2018-03-07 (×5): 3 mL via INTRAVENOUS

## 2018-03-04 MED ORDER — INSULIN REGULAR HUMAN 100 UNIT/ML IJ SOLN: 6.0000 [IU] | Freq: Once | INTRAMUSCULAR | Status: DC

## 2018-03-04 MED ORDER — CALCIUM CARBONATE ANTACID 500 MG PO CHEW: 2.0000 | CHEWABLE_TABLET | ORAL | Status: DC | PRN

## 2018-03-04 MED ORDER — BETAMETHASONE SOD PHOS & ACET 6 (3-3) MG/ML IJ SUSP
12.0000 mg | INTRAMUSCULAR | Status: AC
  Administered 2018-03-04: 12 mg via INTRAMUSCULAR
  Filled 2018-03-04: qty 2

## 2018-03-04 MED ORDER — DOCUSATE SODIUM 100 MG PO CAPS
100.0000 mg | ORAL_CAPSULE | Freq: Every day | ORAL | Status: DC
  Administered 2018-03-05 – 2018-03-10 (×5): 100 mg via ORAL
  Filled 2018-03-04 (×7): qty 1

## 2018-03-04 NOTE — MAU Note (Signed)
No complaints.  Did ok with injection yesterday.

## 2018-03-04 NOTE — MAU Note (Signed)
Called MFM for Dr 's #.  Saw note on BPP, "to MAU for eval and admission to L&D for pre-e with severe features."  Hansel Starling, CNM and discussed this further.  She will call Dr Grace Bushy to clarify.  Explained confusion to pt, verbalized understanding.  Though did not think adm to have baby- but for duration of preg.

## 2018-03-04 NOTE — ED Notes (Signed)
Pt with 8/8 BPP today in MFM.  Discussed persistent headache with Dr. Grace Bushy.  See note in BPP report.  Dr. Grace Bushy recommends evaluation in MAU for BP and HA.  Report called to E. [name], CNM.  Pt had to leave, will return to MAU around 6pm.

## 2018-03-04 NOTE — H&P (Signed)
[name] is a 32 y.o. female presenting from MFM clinic for observation due to pre-eclampsia at 27wks w/ severe features.  BPP in clinic today 8:8, headache persists from yesterday.. OB History    Gravida  3   Para  0   Term  0   Preterm  0   AB  2   Living  0     SAB  0   TAB  2   Ectopic  0   Multiple  0   Live Births             Past Medical History:  Diagnosis Date  . Asthma   . No pertinent past medical history   . Ovarian cyst    Past Surgical History:  Procedure Laterality Date  . BACK SURGERY    . DILATION AND CURETTAGE OF UTERUS    . INDUCED ABORTION     x2  . laporocscopy     Family History: family history includes Hypertension in her father and mother. Social History:  reports that she quit smoking about 2 years ago. She smoked 0.50 packs per day. She has never used smokeless tobacco. She reports that she does not drink alcohol or use drugs.     Maternal Diabetes: FAILED 1-HOUR (199) 3-HOUR PENDING Genetic Screening: Normal Maternal Ultrasounds/Referrals: Normal Fetal Ultrasounds or other Referrals:  None Maternal Substance Abuse:  No Significant Maternal Medications:  None Significant Maternal Lab Results:  Lab values include: Other: UPCR >0.3/400 ON 24 HR URINE Other Comments:  None  Review of Systems  Constitutional: Positive for malaise/fatigue.  HENT: Negative.   Eyes: Positive for photophobia.  Respiratory: Negative.   Cardiovascular: Negative.   Gastrointestinal: Negative.   Genitourinary: Negative.   Musculoskeletal: Negative.   Skin: Negative.   Neurological: Positive for headaches.  Endo/Heme/Allergies: Negative.   Psychiatric/Behavioral: Negative.    Maternal Medical History:  Fetal activity: Perceived fetal activity is normal.    Prenatal complications: PIH and pre-eclampsia.       Blood pressure 133/79, pulse (!) 106, weight 102.2 kg, last menstrual period 08/25/2017. Maternal Exam:  Abdomen: Patient  reports no abdominal tenderness. Introitus: not evaluated.     Physical Exam  Vitals reviewed. Constitutional: She is oriented to person, place, and time. She appears well-developed and well-nourished.  HENT:  Head: Normocephalic.  Eyes: Pupils are equal, round, and reactive to light. Conjunctivae and EOM are normal.  Neck: Normal range of motion. Neck supple.  Cardiovascular: Normal rate, regular rhythm and normal heart sounds.  Respiratory: Effort normal and breath sounds normal. No respiratory distress. She has no wheezes.  GI: Soft. She exhibits no distension. There is no tenderness. There is no guarding.  Musculoskeletal: Normal range of motion. She exhibits edema.  Neurological: She is alert and oriented to person, place, and time. She has normal reflexes.  Skin: Skin is warm and dry. No rash noted. No erythema.  Psychiatric: She has a normal mood and affect. Her behavior is normal. Judgment and thought content normal.     Prenatal labs: ABO, Rh:  A POS Antibody:  NEG Rubella:  IMMUNE RPR:   N/R HBsAg:   NEG HIV:   NEG GBS:   N/A  UPCR: 0.28 - stable from prior labs CMP: Uric Acid 4.7 Creatinine: 0.66 AST/ALT: 21/14 HGB: 12.3 HCT: 35.1 PLT: 332    Assessment/Plan: 27.2 wk G3P0020 w/ known Pre-E f/b MFM Persistent headache unrelieved by tylenol Reassuring FHR status BPP 8:8   Plan for  observation overnight, ambien for sleep, serial BP and re-evaluation in AM for discharge for continued outpatient management vs inpatient management. Pt in agreement with POC.  Dr. Sallye Ober aware.   Altamese Cabal 03/04/2018, 8:58 PM

## 2018-03-04 NOTE — MAU Note (Signed)
Pt brought to rm, questioning MAU, was told she was being admitted.  Note reviewed, called CNM "to MAU for further eval,due to dx of Pre-e with HA". Pt  Asking for dr to be called for clarification.

## 2018-03-04 NOTE — ED Notes (Signed)
Report called to J. Spurlock-Frizzell, Charity fundraiser.  Pt will be returning to MAU for evaluation of BP due to persistent HA.

## 2018-03-05 DIAGNOSIS — O139 Gestational [pregnancy-induced] hypertension without significant proteinuria, unspecified trimester: Secondary | ICD-10-CM | POA: Diagnosis present

## 2018-03-05 LAB — GLUCOSE, CAPILLARY
GLUCOSE-CAPILLARY: 232 mg/dL — AB (ref 70–99)
Glucose-Capillary: 152 mg/dL — ABNORMAL HIGH (ref 70–99)
Glucose-Capillary: 184 mg/dL — ABNORMAL HIGH (ref 70–99)
Glucose-Capillary: 191 mg/dL — ABNORMAL HIGH (ref 70–99)

## 2018-03-05 LAB — GLUCOSE, RANDOM: Glucose, Bld: 271 mg/dL — ABNORMAL HIGH (ref 70–99)

## 2018-03-05 MED ORDER — NIFEDIPINE ER OSMOTIC RELEASE 30 MG PO TB24
30.0000 mg | ORAL_TABLET | Freq: Every day | ORAL | Status: DC
  Administered 2018-03-05 – 2018-03-09 (×5): 30 mg via ORAL
  Filled 2018-03-05 (×5): qty 1

## 2018-03-05 MED ORDER — PRENATAL MULTIVITAMIN CH: 1.0000 | ORAL_TABLET | Freq: Every day | ORAL | Status: DC

## 2018-03-05 MED ORDER — INSULIN ASPART 100 UNIT/ML ~~LOC~~ SOLN
2.0000 [IU] | Freq: Three times a day (TID) | SUBCUTANEOUS | Status: DC
  Administered 2018-03-05 – 2018-03-06 (×2): 8 [IU] via SUBCUTANEOUS
  Administered 2018-03-06: 4 [IU] via SUBCUTANEOUS
  Administered 2018-03-06: 8 [IU] via SUBCUTANEOUS

## 2018-03-05 MED ORDER — BUTALBITAL-APAP-CAFFEINE 50-325-40 MG PO TABS
1.0000 | ORAL_TABLET | ORAL | Status: DC | PRN
  Administered 2018-03-05 – 2018-03-08 (×2): 1 via ORAL
  Filled 2018-03-05 (×2): qty 1

## 2018-03-05 MED ORDER — INSULIN ASPART 100 UNIT/ML ~~LOC~~ SOLN
14.0000 [IU] | Freq: Once | SUBCUTANEOUS | Status: AC
  Administered 2018-03-06: 14 [IU] via SUBCUTANEOUS

## 2018-03-05 NOTE — Progress Notes (Signed)
Hospital day # 1 pregnancy at [redacted]w[redacted]d-gestational hypertension and GDM.  S: Pt denies headache.  States slept good.  Complains of being hungry.  Would like to see diabetic educator for dietary options.  FM+.  Betamethasone complete on 03/04/2018.   On Procardia 30 XL daily      Perception of contractions: none      Vaginal bleeding: none now       Vaginal discharge:  None and no significant change  O: BP 129/72 (BP Location: Left Arm)   Pulse (!) 108   Temp 98.2 F (36.8 C) (Oral)   Resp 18   Ht 5\' 9"  (1.753 m)   Wt 103.4 kg   LMP 08/25/2017   SpO2 97%   BMI 33.67 kg/m   Vitals:   03/04/18 2158 03/04/18 2341 03/05/18 0359 03/05/18 0826  BP:  (!) 131/57 130/80 129/72  Pulse:  (!) 107 (!) 101 (!) 108  Resp:  18 18 18   Temp:  (!) 97.2 F (36.2 C) 98.4 F (36.9 C) 98.2 F (36.8 C)  TempSrc:  Oral Oral Oral  SpO2:  100% 91% 97%  Weight: 103.4 kg     Height: 5\' 9"  (1.753 m)           Fetal tracings:FHT BL 130 accels noted       Contractions:   None      Uterus gravid and non-tender      Extremities: extremities normal, atraumatic, no cyanosis or edema and no significant edema and no signs of DVT          Labs:   Results for orders placed or performed during the hospital encounter of 03/04/18 (from the past 24 hour(s))  CBC     Status: Abnormal   Collection Time: 03/04/18  6:20 PM  Result Value Ref Range   WBC 11.8 (H) 4.0 - 10.5 K/uL   RBC 3.94 3.87 - 5.11 MIL/uL   Hemoglobin 12.3 12.0 - 15.0 g/dL   HCT 16.1 (L) 09.6 - 04.5 %   MCV 89.1 80.0 - 100.0 fL   MCH 31.2 26.0 - 34.0 pg   MCHC 35.0 30.0 - 36.0 g/dL   RDW 40.9 81.1 - 91.4 %   Platelets 332 150 - 400 K/uL   nRBC 0.0 0.0 - 0.2 %  Comprehensive metabolic panel     Status: Abnormal   Collection Time: 03/04/18  6:20 PM  Result Value Ref Range   Sodium 133 (L) 135 - 145 mmol/L   Potassium 4.1 3.5 - 5.1 mmol/L   Chloride 101 98 - 111 mmol/L   CO2 19 (L) 22 - 32 mmol/L   Glucose, Bld 209 (H) 70 - 99 mg/dL   BUN 8 6  - 20 mg/dL   Creatinine, Ser 7.82 0.44 - 1.00 mg/dL   Calcium 9.4 8.9 - 95.6 mg/dL   Total Protein 7.0 6.5 - 8.1 g/dL   Albumin 3.4 (L) 3.5 - 5.0 g/dL   AST 21 15 - 41 U/L   ALT 14 0 - 44 U/L   Alkaline Phosphatase 78 38 - 126 U/L   Total Bilirubin 0.6 0.3 - 1.2 mg/dL   GFR calc non Af Amer >60 >60 mL/min   GFR calc Af Amer >60 >60 mL/min   Anion gap 13 5 - 15  Uric acid     Status: None   Collection Time: 03/04/18  6:20 PM  Result Value Ref Range   Uric Acid, Serum 4.7 2.5 - 7.1 mg/dL  Protein / creatinine ratio, urine     Status: Abnormal   Collection Time: 03/04/18  6:23 PM  Result Value Ref Range   Creatinine, Urine 223.00 mg/dL   Total Protein, Urine 63 mg/dL   Protein Creatinine Ratio 0.28 (H) 0.00 - 0.15 mg/mg[Cre]  Type and screen [facility]     Status: None   Collection Time: 03/04/18  9:07 PM  Result Value Ref Range   ABO/RH(D) A POS    Antibody Screen NEG    Sample Expiration      03/07/2018 Performed at [facility], [address] [place], [place] [address]   Glucose, capillary     Status: Abnormal   Collection Time: 03/04/18 11:09 PM  Result Value Ref Range   Glucose-Capillary 285 (H) 70 - 99 mg/dL  Glucose, random     Status: Abnormal   Collection Time: 03/04/18 11:55 PM  Result Value Ref Range   Glucose, Bld 271 (H) 70 - 99 mg/dL  Glucose, capillary     Status: Abnormal   Collection Time: 03/05/18  6:33 AM  Result Value Ref Range   Glucose-Capillary 152 (H) 70 - 99 mg/dL  Glucose, capillary     Status: Abnormal   Collection Time: 03/05/18  9:08 AM  Result Value Ref Range   Glucose-Capillary 184 (H) 70 - 99 mg/dL         Meds:Sliding scale insulin per Dr. Su Hilt  A: [redacted]w[redacted]d with gestational hypertension, GDM just diagnoisised     stable Cat 1 strip P: Continue current plan of care      Upcoming tests/treatments:  Sliding scale insulin due to high BS most likely related to steriods.  Just diagnosis with GDM.  Will monitor to  see if needs continued medication management for sugars.  Monitor BPS.      MDs will follow  Kenney Houseman CNM, MSN 03/05/2018 9:56 AM

## 2018-03-06 LAB — GLUCOSE, CAPILLARY
GLUCOSE-CAPILLARY: 188 mg/dL — AB (ref 70–99)
GLUCOSE-CAPILLARY: 206 mg/dL — AB (ref 70–99)
Glucose-Capillary: 141 mg/dL — ABNORMAL HIGH (ref 70–99)
Glucose-Capillary: 134 mg/dL — ABNORMAL HIGH (ref 70–99)
Glucose-Capillary: 151 mg/dL — ABNORMAL HIGH (ref 70–99)

## 2018-03-06 MED ORDER — MORPHINE SULFATE (PF) 0.5 MG/ML IJ SOLN
INTRAMUSCULAR | Status: AC
  Filled 2018-03-06: qty 10

## 2018-03-06 MED ORDER — FENTANYL CITRATE (PF) 100 MCG/2ML IJ SOLN
INTRAMUSCULAR | Status: AC
  Filled 2018-03-06: qty 2

## 2018-03-06 MED ORDER — ACETAMINOPHEN 500 MG PO TABS
500.0000 mg | ORAL_TABLET | Freq: Four times a day (QID) | ORAL | Status: DC | PRN
  Filled 2018-03-06: qty 1

## 2018-03-06 MED ORDER — DIPHENHYDRAMINE HCL 25 MG PO CAPS
25.0000 mg | ORAL_CAPSULE | Freq: Every evening | ORAL | Status: DC | PRN
  Filled 2018-03-06: qty 1

## 2018-03-06 MED ORDER — DEXAMETHASONE SODIUM PHOSPHATE 10 MG/ML IJ SOLN
INTRAMUSCULAR | Status: AC
  Filled 2018-03-06: qty 1

## 2018-03-06 NOTE — Progress Notes (Signed)
Patient ID: [name], female   DOB: 02/22/1986, 32 y.o.   MRN: [id number]   Subjective: Patient continues to complain of a headache. It is 5/10 intensity, frontal mostly. Tylenol and fioricet used yesterday mildly improved the headache but did not eliminate it.  She declines other headache medication trial at this time, but would like to try tylenol PM in the evening to help with her  Insomnia, she has had insomnia for several weeks now.  She sleeps just about 3 hours a night.  She denies significant emotional stress affecting her at this time. She denies blurry vision, scotomata,nausea, vomiting, epigastric pain, chest pain, shortness of breath, palpitations or abdominal pain.  She denies contractions, vaginal bleeding or leakage of fluid.  She reports normal gross fetal movement.    Objective: I have reviewed patient's vital signs, intake and output, medications and labs. Vitals: Vitals:   03/06/18 0013 03/06/18 0255 03/06/18 0811 03/06/18 1142  BP: 114/70 126/72 130/84 108/78  Pulse: (!) 105 100 (!) 101 98  Resp: 18 17 18 18   Temp: 98.9 F (37.2 C) 98.5 F (36.9 C) 97.7 F (36.5 C)   TempSrc: Oral Oral Oral   SpO2: 97% 92% 95% 93%  Weight:      Height:       General: alert, cooperative and no distress Resp: clear to auscultation bilaterally Cardio: regular rate and rhythm, S1, S2 normal, no murmur, click, rub or gallop GI: soft, non-tender; bowel sounds normal; no masses,  no organomegaly.  Gravid uterus Extremities: extremities normal, atraumatic, no cyanosis or edema.  2+ patellar reflex bilaterally.  03/06/2018 at 11am:  EFM: 140 BL, moderate variability, reactive by 10 X 10 criteria.  TOCO: No contractions.   Labs:  CBC    Component Value Date/Time   WBC 11.8 (H) 03/04/2018 1820   RBC 3.94 03/04/2018 1820   HGB 12.3 03/04/2018 1820   HCT 35.1 (L) 03/04/2018 1820   PLT 332 03/04/2018 1820   MCV 89.1 03/04/2018 1820   MCH 31.2 03/04/2018 1820   MCHC 35.0 03/04/2018  1820   RDW 13.0 03/04/2018 1820   LYMPHSABS 2.8 10/25/2017 0958   MONOABS 0.3 10/25/2017 0958   EOSABS 0.1 10/25/2017 0958   BASOSABS 0.0 10/25/2017 0958   CMP     Component Value Date/Time   NA 133 (L) 03/04/2018 1820   K 4.1 03/04/2018 1820   CL 101 03/04/2018 1820   CO2 19 (L) 03/04/2018 1820   GLUCOSE 271 (H) 03/04/2018 2355   BUN 8 03/04/2018 1820   CREATININE 0.66 03/04/2018 1820   CALCIUM 9.4 03/04/2018 1820   PROT 7.0 03/04/2018 1820   ALBUMIN 3.4 (L) 03/04/2018 1820   AST 21 03/04/2018 1820   ALT 14 03/04/2018 1820   ALKPHOS 78 03/04/2018 1820   BILITOT 0.6 03/04/2018 1820   GFRNONAA >60 03/04/2018 1820   GFRAA >60 03/04/2018 1820   Protein / creatinine ratio, urine  Order: [id number]  Status:  Final result Visible to patient:  No (Not Released) Next appt:  03/11/2018 at 01:00 PM in Radiology ([facility] [place] [facility])   Ref Range & Units 2d ago 3d ago  Creatinine, Urine mg/dL 782.95  62.13   Total Protein, Urine mg/dL 63  22 CM  Comment: NO NORMAL RANGE ESTABLISHED FOR THIS TEST  Protein Creatinine Ratio 0.00 - 0.15 mg/mg 0.28High   0.28High         Protein, urine, 24 hour  Order: [id number]  Status:  Final result Visible  to patient:  No (Not Released) Next appt:  03/11/2018 at 01:00 PM in Radiology ([facility] [place] [facility])   Ref Range & Units 7d ago  Urine Total Volume-UPROT mL 1,825   Comment: Performed at [facility], [address] [place], [place] [address]  Collection Interval-UPROT hours 24   Comment: Performed at [facility], [address] [place], [place] [address]  Protein, Urine mg/dL 22   Protein, 09W Urine 50 - 100 mg/day 402High           Component     Latest Ref Rng & Units 03/04/2018 03/05/2018 03/05/2018 03/05/2018          6:33 AM  9:08 AM  3:09 PM  Glucose-Capillary     70 - 99 mg/dL 119 (H) 147 (H) 829 (H) 191 (H)   Component     Latest Ref Rng & Units 03/05/2018 03/06/2018 03/06/2018 03/06/2018        11:32 PM  2:45 AM  6:03 AM  10:11 AM  Glucose-Capillary     70 - 99 mg/dL 562 (H) 130 (H) 865 (H) 188 (H)    Medication:    Current Facility-Administered Medications:  .  0.9 %  sodium chloride infusion, 250 mL, Intravenous, PRN, Altamese Cabal, CNM .  acetaminophen (TYLENOL) tablet 1,000 mg, 1,000 mg, Oral, Q6H PRN, Rebbeca Paul M, CNM, 1,000 mg at 03/04/18 1945 .  butalbital-acetaminophen-caffeine (FIORICET, ESGIC) 50-325-40 MG per tablet 1 tablet, 1 tablet, Oral, Q4H PRN, Prothero, Henderson Newcomer, CNM, 1 tablet at 03/05/18 1338 .  calcium carbonate (TUMS - dosed in mg elemental calcium) chewable tablet 400 mg of elemental calcium, 2 tablet, Oral, Q4H PRN, Altamese Cabal, CNM .  docusate sodium (COLACE) capsule 100 mg, 100 mg, Oral, Daily, Rebbeca Paul M, CNM, 100 mg at 03/06/18 1040 .  insulin aspart (novoLOG) injection 2-8 Units, 2-8 Units, Subcutaneous, TID WC, Prothero, Henderson Newcomer, CNM, 8 Units at 03/06/18 1038 .  NIFEdipine (PROCARDIA-XL/NIFEDICAL-XL) 24 hr tablet 30 mg, 30 mg, Oral, QHS, Rebbeca Paul M, CNM, 30 mg at 03/05/18 2146 .  prenatal multivitamin tablet 1 tablet, 1 tablet, Oral, Q1200, Altamese Cabal, CNM, 1 tablet at 03/06/18 1040 .  sodium chloride flush (NS) 0.9 % injection 3 mL, 3 mL, Intravenous, Q12H, Rebbeca Paul M, CNM, 3 mL at 03/05/18 2207 .  sodium chloride flush (NS) 0.9 % injection 3 mL, 3 mL, Intravenous, PRN, [name], Erin M, CNM, 3 mL at 03/05/18 0835 .  zolpidem (AMBIEN) tablet 5 mg, 5 mg, Oral, QHS PRN, Rebbeca Paul M, CNM, 5 mg at 03/04/18 2319   Assessment/Plan:  32 y/o G2P0010 at 27 weeks 4 days EGA with known preeclampsia, now with possible preeclapsia with severe features, -Stable maternal and fetal status.  -s/p Betamethasone for fetal lung maturity, already beta complete.  Recent steriods may explain elevation in FSGs values.  C/w with FSG sliding scale coverage. Patient supposed to do 3 hr GTT at least 1 week after last steroid injection, she failed 1 hr GCT test.    -Patient desires  continued hospitalization at this time.   -F/u with MFM consultation tomorrow ref. To continue inpatient management or not.   -Tylenol PM for insomnia.  She declines pain meds for headache at this time.   LOS: 3 days   Konrad Felix, MD.  03/06/2018 1420.

## 2018-03-07 ENCOUNTER — Inpatient Hospital Stay (HOSPITAL_COMMUNITY): Payer: 59

## 2018-03-07 DIAGNOSIS — O1412 Severe pre-eclampsia, second trimester: Secondary | ICD-10-CM

## 2018-03-07 LAB — GLUCOSE, CAPILLARY
GLUCOSE-CAPILLARY: 166 mg/dL — AB (ref 70–99)
GLUCOSE-CAPILLARY: 178 mg/dL — AB (ref 70–99)
GLUCOSE-CAPILLARY: 258 mg/dL — AB (ref 70–99)
GLUCOSE-CAPILLARY: 162 mg/dL — AB (ref 70–99)
GLUCOSE-CAPILLARY: 123 mg/dL — AB (ref 70–99)
Glucose-Capillary: 126 mg/dL — ABNORMAL HIGH (ref 70–99)
Glucose-Capillary: 300 mg/dL — ABNORMAL HIGH (ref 70–99)
Glucose-Capillary: 177 mg/dL — ABNORMAL HIGH (ref 70–99)
Glucose-Capillary: 162 mg/dL — ABNORMAL HIGH (ref 70–99)

## 2018-03-07 MED ORDER — INSULIN ASPART 100 UNIT/ML ~~LOC~~ SOLN
8.0000 [IU] | Freq: Three times a day (TID) | SUBCUTANEOUS | Status: DC
  Administered 2018-03-07 – 2018-03-08 (×2): 8 [IU] via SUBCUTANEOUS

## 2018-03-07 MED ORDER — INSULIN NPH (HUMAN) (ISOPHANE) 100 UNIT/ML ~~LOC~~ SUSP
12.0000 [IU] | Freq: Two times a day (BID) | SUBCUTANEOUS | Status: DC
  Filled 2018-03-07: qty 10

## 2018-03-07 MED ORDER — INSULIN REGULAR HUMAN 100 UNIT/ML IJ SOLN: 8.0000 [IU] | Freq: Three times a day (TID) | INTRAMUSCULAR | Status: DC

## 2018-03-07 MED ORDER — INSULIN NPH (HUMAN) (ISOPHANE) 100 UNIT/ML ~~LOC~~ SUSP
12.0000 [IU] | Freq: Every day | SUBCUTANEOUS | Status: DC
  Administered 2018-03-07: 12 [IU] via SUBCUTANEOUS

## 2018-03-07 MED ORDER — INSULIN ASPART 100 UNIT/ML ~~LOC~~ SOLN
0.0000 [IU] | Freq: Three times a day (TID) | SUBCUTANEOUS | Status: DC
  Administered 2018-03-07: 8 [IU] via SUBCUTANEOUS

## 2018-03-07 NOTE — Consult Note (Signed)
Maternal-Fetal Medicine (Follow-up consultation) MRN: [id number] Requesting Provider: Silverio Lay, MD Ms. [name], G1 P0 at 27w 5d gestation, was admitted 3 days ago with c/o persistent headache. She had MFM consultation (Dr. Perry Mount) on 02/25/18 after she had a diagnosis of gestational hypertension.  Your office evaluation showed increased proteinuria. She has a new diagnosis of gestational diabetes. Patient has intermittent mild to moderate headaches. No visual disturbances or right upper quadrant pain or vaginal bleeding. No history of nausea or vomiting.  She takes Procardia XL 30 mg daily and her blood pressures are well controlled. She received a course of betamethasone last week. Following hyperglycemia, she had consultation with diabetic educator. PMH: No history of chronic hypertension. Allergies: NKDA. Social: Denies tobacco or drug or alcohol use.  Prenatal course: Aneuploidy screen is negative. P/E: Patient is comfortably lying in bed; not in distress. BP: 108-140/66-84 mm Hg. HEENT: Normal. Abd: Soft gravid uterus; no tenderness. No flank tenderness. No pedal edema. NST reactive for this gestational age. Labs: Hb 12.3, Hct 35.6, PLT332, WBC 11.8, electrolytes normal, ALT 14, AST 21, creatinine 0.66. Glucose fasting 166. Postprandial elevations up to 300. On ultrasound performed on 03/04/18, antenatal testing was reassuring.  I counseled the patient on the following: Gestational diabetes:  I explained the diagnosis and that 3-hour GTT is not necessary given that she has increased blood glucose.  -Emphasized the importance of diet and insulin in achieving good control. -Possible complications of poor control can lead to adverse fetal and neonatal outcomes including stillbirth. Macrosomia, shoulder dystocia neurological injuries can occur at birth.  -I discussed blood glucose monitoring and normal parameters. -I also discussed hypoglycemia and corrective measures. -I  recommend initiating insulin NPH 12 units at night and Humalog 8 units with meals. It is possible that she may require less insulin once steroid effect wears off (varies in different patients). -I recommend evaluation of fasting and postprandial (1 or 2 hour) blood glucose levels and sliding scale correction for increased postprandial glucose. -Inpatient management for up to 48 hours or adequate glucose control and this gives an opportunity for patients to learn self-monitoring. -Discussed postpartum screening to rule out type 2 diabetes.  Preeclampsia without severe features: -I explained the diagnosis and possible maternal complications of severe features including eclampsia and end-organ failure. -We discussed timing of delivery based on the presence of absence of severe features. -Outpatient management is possible with normal or mild hypertension. -Patient is aware of symptoms of severe features that should prompt her to come to the hospital.  Recommendations: -Continue Procardia. -Initiate insulin NPH 12 units at night and Humalog 8 units with each meal; adjust insulin dosage as needed. -Blood glucose fasting and 1 or 2-hour postprandial (4 times daily). -Inpatient management for 48 hours to achieve good control of diabetes.

## 2018-03-07 NOTE — Plan of Care (Signed)
  Problem: Safety: Goal: Ability to remain free from injury will improve Outcome: Progressing   Problem: Education: Goal: Ability to describe self-care measures that may prevent or decrease complications (Diabetes Survival Skills Education) will improve Outcome: Progressing   Problem: Nutritional: Goal: Maintenance of adequate nutrition will improve Outcome: Progressing   Problem: Education: Goal: Knowledge of disease or condition will improve Outcome: Progressing

## 2018-03-07 NOTE — Progress Notes (Addendum)
Inpatient Diabetes Program Recommendations  AACE/ADA: New Consensus Statement on Inpatient Glycemic Control (2015)  Target Ranges:  Prepandial:   less than 140 mg/dL      Peak postprandial:   less than 180 mg/dL (1-2 hours)      Critically ill patients:  140 - 180 mg/dL   Lab Results  Component Value Date   GLUCAP 166 (H) 03/07/2018    Review of Glycemic Control Results for [name], [name] (MRN [id number]) as of 03/07/2018 09:25  Ref. Range 03/06/2018 15:06 03/06/2018 22:14 03/07/2018 04:37 03/07/2018 05:17  Glucose-Capillary Latest Ref Range: 70 - 99 mg/dL 782 (H) 956 (H) 213 (H) 166 (H)   Diabetes history: GDM Outpatient Diabetes medications: none Current orders for Inpatient glycemic control: Novolog 2-8 units TID  Inpatient Diabetes Program Recommendations:    Noted consult for newly diagnosed GDM.   Current trends are above inpatient goal of >180 mg/dL. Noted patient received BMZ on 10/24 & 10/25, however, feel that this is not related to current trends.   Given yesterday's trends, total received of short acting insulin and current blood glucose, would recommend adding insulin regimen.   Would recommend adding NPH 12 units QAM and QHS (based on a total daily dose of 74 units at 103 kg). Adding  Novolog 0-14 units correction TID, under the Diabetic pregnant patient order set. Will watch post prandials today for elevations and anticipate the need for meal coverage.    Pending MFM consult: In the event of recommending delivery, would consider glucostabilizer for BS >120 mg/dL.  Will plan to see patient today.   Thanks, Lujean Rave, MSN, RNC-OB Diabetes Coordinator [phone] (8a-5p)

## 2018-03-07 NOTE — Progress Notes (Addendum)
Hospital day # 3 pregnancy at [redacted]w[redacted]d--Admitted for Preeclampsia with severe features. 24h urine 402. Failed 1h gtt.  S:  Feels fine now, worse in evenings including persistent h/a and dizziness.      Dissatisfied with diabetic diet choices, willing to try       Perception of contractions: none      Vaginal bleeding: none now     O: BP 140/85 (BP Location: Right Arm)   Pulse 94   Temp 97.9 F (36.6 C) (Oral)   Resp 18   Ht 5\' 9"  (1.753 m)   Wt 103.4 kg   LMP 08/25/2017   SpO2 98%   BMI 33.67 kg/m    Vitals:   03/06/18 1142 03/06/18 1616 03/06/18 1935 03/07/18 0431  BP: 108/78 131/66 135/73 140/85  Pulse: 98 91 93 94  Resp: 18 18 18 18   Temp:  98.2 F (36.8 C) 98.8 F (37.1 C) 97.9 F (36.6 C)  TempSrc:  Oral Oral Oral  SpO2: 93% 95% 98% 98%  Weight:      Height:            Fetal tracings: cat 1, reactive      Contractions:   none      Uterus gravid and non-tender      Extremities: no significant edema and no signs of DVT   CBC Latest Ref Rng & Units 03/04/2018 03/03/2018 10/25/2017  WBC 4.0 - 10.5 K/uL 11.8(H) 7.2 6.6  Hemoglobin 12.0 - 15.0 g/dL 73.2 20.2 54.2  Hematocrit 36.0 - 46.0 % 35.1(L) 35.6(L) 37.3  Platelets 150 - 400 K/uL 332 292 342   CMP Latest Ref Rng & Units 03/04/2018 03/04/2018 03/03/2018  Glucose 70 - 99 mg/dL 706(C) 376(E) 831(D)  BUN 6 - 20 mg/dL - 8 <1(V)  Creatinine 6.16 - 1.00 mg/dL - 0.73 7.10  Sodium 626 - 145 mmol/L - 133(L) 134(L)  Potassium 3.5 - 5.1 mmol/L - 4.1 3.5  Chloride 98 - 111 mmol/L - 101 104  CO2 22 - 32 mmol/L - 19(L) 20(L)  Calcium 8.9 - 10.3 mg/dL - 9.4 9.3  Total Protein 6.5 - 8.1 g/dL - 7.0 7.0  Total Bilirubin 0.3 - 1.2 mg/dL - 0.6 0.6  Alkaline Phos 38 - 126 U/L - 78 81  AST 15 - 41 U/L - 21 18  ALT 0 - 44 U/L - 14 14   CBG (last 3)  Recent Labs    03/06/18 2214 03/07/18 0437 03/07/18 0517  GLUCAP 206* 126* 166*        A: [redacted]w[redacted]d with preeclampsia     On procardia 30mg  XL daily     Likely GDM, no 3h GTT  yet     On  Sliding scale insulin      S/p BMZ 10/24, 10/25     stable  P: MFM consult today      Diabetic coordinator referral      Continue daily NSTs      MDs will follow  Nigel Bridgeman CNM, [place] 03/07/2018 8:07 AM  Seen and agreed Reviewed with Dr Judeth Cornfield: likely Type 2 DB or Class A2: NPH 12 units HS and Novolin 8 units with each meal Will monitor CBG and confirm Insulin teaching. Likely to D/C home tomorrow with weekly ROB,PIH labs and BPP

## 2018-03-07 NOTE — Progress Notes (Signed)
Spoke with Crystal in maternal fetal medicine. Crystal assured me that she would notify MFM physician on call of consult request on [name].

## 2018-03-07 NOTE — Progress Notes (Signed)
Inpatient Diabetes Program Recommendations  AACE/ADA: New Consensus Statement on Inpatient Glycemic Control (2015)  Target Ranges:  Prepandial:   less than 140 mg/dL      Peak postprandial:   less than 180 mg/dL (1-2 hours)      Critically ill patients:  140 - 180 mg/dL   Lab Results  Component Value Date   GLUCAP 258 (H) 03/07/2018    Review of Glycemic Control Results for [name], [name] (MRN [id number]) as of 03/07/2018 16:05  Ref. Range 03/07/2018 05:17 03/07/2018 11:27 03/07/2018 12:37 03/07/2018 14:59  Glucose-Capillary Latest Ref Range: 70 - 99 mg/dL 132 (H) 440 (H) 102 (H) 258 (H)    Diabetes history: GDM Outpatient Diabetes medications: none Current orders for Inpatient glycemic control: Novolog 2-8 units TID  Inpatient Diabetes Program Recommendations:    Spoke with patient regarding new diagnosis of diabetes. Patient is frustrated with meal options while inpatient, however, has been counting carbohydrates. Per patient, she has abided by 50 carb/meal, 15 carb/snacks.   Discussed patho of DM, need for insulin, signs and symptoms of hypo vs. Hyperglycemia, vascular changes, comorbidites, hyperinsulinemia in the neonate, hormonal fluctuations and impact to blood glucose, and steroidal impact to blood glucose. Provided GDM handout and dietitian consult. Education provided on differences between NPH and Novolog, insulin injections, and checking CBGs. Encouraged to follow up care post partum and statistics were provided for developing type 2 DM later in life. At this time, patient has no further questions related to DM.  Discussed plan of care with Dr Judeth Cornfield, MFM. Noted patient received BMZ, current inpatient regimen, blood glucose trends, TDD, and order changes that have occurred.  Will continue to follow.   Thanks, Lujean Rave, MSN, RNC-OB Diabetes Coordinator [phone] (8a-5p)

## 2018-03-07 NOTE — Progress Notes (Signed)
  Nutrition Dx: Food and nutrition-related knowledge deficit r/t no previous education aeb newly diagnosed GDM.    Nutrition education consult for Carbohydrate Modified Gestational Diabetic Diet completed.  "Meal  plan for gestational diabetics" handout given to patient.  Basic concepts reviewed.  Questions answered.  Patient verbalizes understanding and is quite adept at looking up CHO counts.   Pt concerned with limited menu options. Unfortunately the hospital menu is limited and foods she desires are not available. Family brings food from outside. She sends them to restaurants that she can look up nutrient analysis for and then orders based on diet CHO recommendations. Have made it possible for pt to order snacks of choice.  Elisabeth Cara M.Odis Luster LDN Neonatal Nutrition Support Specialist/RD III Pager [phone]      Phone [phone]

## 2018-03-08 ENCOUNTER — Encounter (HOSPITAL_COMMUNITY): Payer: 59

## 2018-03-08 LAB — GLUCOSE, CAPILLARY
GLUCOSE-CAPILLARY: 103 mg/dL — AB (ref 70–99)
GLUCOSE-CAPILLARY: 125 mg/dL — AB (ref 70–99)
GLUCOSE-CAPILLARY: 168 mg/dL — AB (ref 70–99)
GLUCOSE-CAPILLARY: 163 mg/dL — AB (ref 70–99)
Glucose-Capillary: 138 mg/dL — ABNORMAL HIGH (ref 70–99)
Glucose-Capillary: 167 mg/dL — ABNORMAL HIGH (ref 70–99)

## 2018-03-08 MED ORDER — INSULIN ASPART 100 UNIT/ML ~~LOC~~ SOLN
12.0000 [IU] | Freq: Three times a day (TID) | SUBCUTANEOUS | Status: DC
  Administered 2018-03-08 – 2018-03-09 (×3): 12 [IU] via SUBCUTANEOUS

## 2018-03-08 MED ORDER — INSULIN NPH (HUMAN) (ISOPHANE) 100 UNIT/ML ~~LOC~~ SUSP
14.0000 [IU] | Freq: Every day | SUBCUTANEOUS | Status: DC
  Administered 2018-03-08: 14 [IU] via SUBCUTANEOUS

## 2018-03-08 MED ORDER — DOXYLAMINE SUCCINATE (SLEEP) 25 MG PO TABS
25.0000 mg | ORAL_TABLET | Freq: Every evening | ORAL | Status: DC | PRN
  Filled 2018-03-08: qty 1

## 2018-03-08 MED ORDER — INSULIN ASPART 100 UNIT/ML ~~LOC~~ SOLN
11.0000 [IU] | Freq: Three times a day (TID) | SUBCUTANEOUS | Status: DC
  Administered 2018-03-08: 11 [IU] via SUBCUTANEOUS

## 2018-03-08 NOTE — Progress Notes (Addendum)
Hospital day # 4 pregnancy at [redacted]w[redacted]d--Admitted for preeclampsia, MFM consult, A2DM s/p BMZ 10/24 and 10/25  S:  Feeling tired       Denies h/a, visual changes, RUQ pain      Perception of contractions: none      Vaginal bleeding: none now       Vaginal discharge:  no significant change  O: BP 133/81 (BP Location: Right Arm)   Pulse 96   Temp 98.4 F (36.9 C) (Oral)   Resp 17   Ht 5\' 9"  (1.753 m)   Wt 103.4 kg   LMP 08/25/2017   SpO2 95%   BMI 33.67 kg/m       Fetal tracings: cat 1, reactive TID      Contractions:   none      Uterus non-tender      Extremities: no significant edema and no signs of DVT           CBG (last 3)  Recent Labs    03/07/18 2041 03/07/18 2241 03/08/18 0810  GLUCAP 177* 162* 103*    Per MFM Recommendations: -Continue Procardia. -Initiate insulin NPH 12 units at night and Humalog 8 units with each meal; adjust insulin dosage as needed. -Blood glucose fasting and 1 or 2-hour postprandial (4 times daily). -Inpatient management for 48 hours to achieve good control of diabetes -Timing of delivery based on presence or absence of severe features -Outpatient management is possible with normal or mild hypertension.  A: [redacted]w[redacted]d with preeclampsia without severe features and A2DM     stable  P: Continue current plan of care -NST TID -FBS and PP CGBs            MDs will follow  [name] CNM, [place] 03/08/2018 8:20 AM Attestation of Attending Supervision of Advanced Practitioner (CNM/NP): Evaluation and management procedures were performed by the Advanced Practitioner under my supervision and collaboration.  I have reviewed the Advanced Practitioner's note and chart, and I agree with the management and plan.  I reviewed NST at 12.15 pm today showing 150 BL, mod variability, reactive.  TOCO: NO contractions.  C/w FSG monitoring.  Patient just requested fioricet for her headache, she refuses to try other medication options.  Wants to try unisom for insomnia  tonight.  Dr. Sallye Brewer. 03/08/2018.  1221pm.

## 2018-03-09 ENCOUNTER — Encounter (HOSPITAL_COMMUNITY): Payer: Self-pay

## 2018-03-09 LAB — GLUCOSE, CAPILLARY
GLUCOSE-CAPILLARY: 106 mg/dL — AB (ref 70–99)
GLUCOSE-CAPILLARY: 161 mg/dL — AB (ref 70–99)
Glucose-Capillary: 155 mg/dL — ABNORMAL HIGH (ref 70–99)
Glucose-Capillary: 160 mg/dL — ABNORMAL HIGH (ref 70–99)
Glucose-Capillary: 204 mg/dL — ABNORMAL HIGH (ref 70–99)

## 2018-03-09 MED ORDER — INSULIN NPH (HUMAN) (ISOPHANE) 100 UNIT/ML ~~LOC~~ SUSP
18.0000 [IU] | Freq: Every day | SUBCUTANEOUS | Status: DC
  Administered 2018-03-09: 18 [IU] via SUBCUTANEOUS

## 2018-03-09 MED ORDER — LACTATED RINGERS IV BOLUS
250.0000 mL | Freq: Once | INTRAVENOUS | Status: AC
  Administered 2018-03-09: 250 mL via INTRAVENOUS

## 2018-03-09 MED ORDER — LACTATED RINGERS IV SOLN
INTRAVENOUS | Status: DC
  Administered 2018-03-09 – 2018-03-10 (×3): via INTRAVENOUS
  Administered 2018-03-10: 125 mL/h via INTRAVENOUS

## 2018-03-09 MED ORDER — INSULIN ASPART 100 UNIT/ML ~~LOC~~ SOLN
16.0000 [IU] | Freq: Every day | SUBCUTANEOUS | Status: DC
  Administered 2018-03-09: 16 [IU] via SUBCUTANEOUS

## 2018-03-09 MED ORDER — INSULIN NPH (HUMAN) (ISOPHANE) 100 UNIT/ML ~~LOC~~ SUSP
10.0000 [IU] | Freq: Every day | SUBCUTANEOUS | Status: DC
  Administered 2018-03-10: 10 [IU] via SUBCUTANEOUS

## 2018-03-09 NOTE — Progress Notes (Signed)
I received a referral from pt's nurse due to her being very dissatisfied with her food here and given the stress that she has in her life.  [name] was in good spirits.  She did speak briefly about the food, but stated that it was going to be okay.  She is just trying to do her best to eat well for the baby and stated that it was frustrating to have to advocate for herself getting the food she needs.  We also spoke about her feelings around becoming a mother.  She is very excited, though she is learning how to relax now because of the pregnancy.  She had been caring for her 57 year old grandmother with dementia who was just placed in a nursing facility.  She described it as a relief knowing that she is taken care of and that Amir can now focus on her and her baby.  Chaplain Dyanne Carrel, Bcc Pager, [phone] 4:22 PM   03/09/18 1600  Clinical Encounter Type  Visited With Patient  Visit Type Initial;Spiritual support  Referral From Nurse

## 2018-03-09 NOTE — Progress Notes (Signed)
Pt c/o of feeling dizzy and dehydrates.  She feels she is unable to drink.  No leakage of fluid or VB.  Good FM  BP 118/69 (BP Location: Right Arm)   Pulse (!) 106   Temp 98.7 F (37.1 C) (Oral)   Resp 18   Ht 5\' 9"  (1.753 m)   Wt 103.4 kg   LMP 08/25/2017   SpO2 94%   BMI 33.67 kg/m   FHTS  reassuring  Toco none  Pt in NAD CV RRR Lungs CTAB abd  Gravid soft and NT GU no vb EXt no calf tenderness Results for orders placed or performed during the hospital encounter of 03/04/18 (from the past 72 hour(s))  Glucose, capillary     Status: Abnormal   Collection Time: 03/06/18  3:06 PM  Result Value Ref Range   Glucose-Capillary 151 (H) 70 - 99 mg/dL  Glucose, capillary     Status: Abnormal   Collection Time: 03/06/18 10:14 PM  Result Value Ref Range   Glucose-Capillary 206 (H) 70 - 99 mg/dL  Glucose, capillary     Status: Abnormal   Collection Time: 03/07/18  4:37 AM  Result Value Ref Range   Glucose-Capillary 126 (H) 70 - 99 mg/dL  Glucose, capillary     Status: Abnormal   Collection Time: 03/07/18  5:17 AM  Result Value Ref Range   Glucose-Capillary 166 (H) 70 - 99 mg/dL  Glucose, capillary     Status: Abnormal   Collection Time: 03/07/18 11:27 AM  Result Value Ref Range   Glucose-Capillary 178 (H) 70 - 99 mg/dL  Glucose, capillary     Status: Abnormal   Collection Time: 03/07/18 12:37 PM  Result Value Ref Range   Glucose-Capillary 300 (H) 70 - 99 mg/dL  Glucose, capillary     Status: Abnormal   Collection Time: 03/07/18  2:59 PM  Result Value Ref Range   Glucose-Capillary 258 (H) 70 - 99 mg/dL  Glucose, capillary     Status: Abnormal   Collection Time: 03/07/18  4:09 PM  Result Value Ref Range   Glucose-Capillary 162 (H) 70 - 99 mg/dL  Glucose, capillary     Status: Abnormal   Collection Time: 03/07/18  6:20 PM  Result Value Ref Range   Glucose-Capillary 123 (H) 70 - 99 mg/dL  Glucose, capillary     Status: Abnormal   Collection Time: 03/07/18  8:41 PM   Result Value Ref Range   Glucose-Capillary 177 (H) 70 - 99 mg/dL  Glucose, capillary     Status: Abnormal   Collection Time: 03/07/18 10:41 PM  Result Value Ref Range   Glucose-Capillary 162 (H) 70 - 99 mg/dL  Glucose, capillary     Status: Abnormal   Collection Time: 03/08/18  8:10 AM  Result Value Ref Range   Glucose-Capillary 103 (H) 70 - 99 mg/dL  Glucose, capillary     Status: Abnormal   Collection Time: 03/08/18 11:27 AM  Result Value Ref Range   Glucose-Capillary 138 (H) 70 - 99 mg/dL  Glucose, capillary     Status: Abnormal   Collection Time: 03/08/18  2:29 PM  Result Value Ref Range   Glucose-Capillary 125 (H) 70 - 99 mg/dL  Glucose, capillary     Status: Abnormal   Collection Time: 03/08/18  4:29 PM  Result Value Ref Range   Glucose-Capillary 168 (H) 70 - 99 mg/dL   Comment 1 Notify RN    Comment 2 Document in Chart   Glucose, capillary  Status: Abnormal   Collection Time: 03/08/18  8:41 PM  Result Value Ref Range   Glucose-Capillary 163 (H) 70 - 99 mg/dL  Glucose, capillary     Status: Abnormal   Collection Time: 03/08/18 11:38 PM  Result Value Ref Range   Glucose-Capillary 167 (H) 70 - 99 mg/dL  Glucose, capillary     Status: Abnormal   Collection Time: 03/09/18  8:21 AM  Result Value Ref Range   Glucose-Capillary 106 (H) 70 - 99 mg/dL  Glucose, capillary     Status: Abnormal   Collection Time: 03/09/18 11:27 AM  Result Value Ref Range   Glucose-Capillary 155 (H) 70 - 99 mg/dL    Assessment and Plan [redacted]w[redacted]d  GDM A2 blood sugars are still elevated.  Will change to 10 NPH with breakfast and 18 NPH at bedtime 16 units with novolog PE without sever features.  BP controlled on procardia Start an IV on pt and monitor I/OS

## 2018-03-10 LAB — GLUCOSE, CAPILLARY
Glucose-Capillary: 110 mg/dL — ABNORMAL HIGH (ref 70–99)
Glucose-Capillary: 84 mg/dL (ref 70–99)
Glucose-Capillary: 144 mg/dL — ABNORMAL HIGH (ref 70–99)

## 2018-03-10 LAB — COMPREHENSIVE METABOLIC PANEL
ALT: 15 U/L (ref 0–44)
AST: 7 U/L — ABNORMAL LOW (ref 15–41)
Albumin: 2.9 g/dL — ABNORMAL LOW (ref 3.5–5.0)
Alkaline Phosphatase: 75 U/L (ref 38–126)
Anion gap: 9 (ref 5–15)
BUN: 12 mg/dL (ref 6–20)
CHLORIDE: 103 mmol/L (ref 98–111)
CO2: 21 mmol/L — ABNORMAL LOW (ref 22–32)
Calcium: 8.8 mg/dL — ABNORMAL LOW (ref 8.9–10.3)
Creatinine, Ser: 0.64 mg/dL (ref 0.44–1.00)
Glucose, Bld: 219 mg/dL — ABNORMAL HIGH (ref 70–99)
POTASSIUM: 3.7 mmol/L (ref 3.5–5.1)
Sodium: 133 mmol/L — ABNORMAL LOW (ref 135–145)
Total Bilirubin: 0.4 mg/dL (ref 0.3–1.2)
Total Protein: 6.4 g/dL — ABNORMAL LOW (ref 6.5–8.1)

## 2018-03-10 LAB — CBC
HCT: 33.4 % — ABNORMAL LOW (ref 36.0–46.0)
Hemoglobin: 11.3 g/dL — ABNORMAL LOW (ref 12.0–15.0)
MCH: 30.6 pg (ref 26.0–34.0)
MCHC: 33.8 g/dL (ref 30.0–36.0)
MCV: 90.5 fL (ref 80.0–100.0)
PLATELETS: 293 10*3/uL (ref 150–400)
RBC: 3.69 MIL/uL — ABNORMAL LOW (ref 3.87–5.11)
RDW: 13.2 % (ref 11.5–15.5)
WBC: 8.5 10*3/uL (ref 4.0–10.5)

## 2018-03-10 LAB — URIC ACID: URIC ACID, SERUM: 3.5 mg/dL (ref 2.5–7.1)

## 2018-03-10 MED ORDER — INSULIN NPH (HUMAN) (ISOPHANE) 100 UNIT/ML ~~LOC~~ SUSP: 18.0000 [IU] | Freq: Every day | SUBCUTANEOUS | 5 refills | Status: DC

## 2018-03-10 MED ORDER — BUTALBITAL-APAP-CAFFEINE 50-325-40 MG PO TABS: 1.0000 | ORAL_TABLET | Freq: Four times a day (QID) | ORAL | 0 refills | Status: DC | PRN

## 2018-03-10 MED ORDER — INSULIN NPH (HUMAN) (ISOPHANE) 100 UNIT/ML ~~LOC~~ SUSP: 10.0000 [IU] | Freq: Every day | SUBCUTANEOUS | 5 refills | Status: DC

## 2018-03-10 MED ORDER — "INSULIN SYRINGE-NEEDLE U-100 30G X 1/2"" 1 ML MISC": 1.0000 | Freq: Four times a day (QID) | 5 refills | Status: DC

## 2018-03-10 MED ORDER — LIVING WELL WITH DIABETES BOOK
Freq: Once | Status: AC
  Administered 2018-03-10: 15:00:00
  Filled 2018-03-10: qty 1

## 2018-03-10 MED ORDER — BLOOD GLUCOSE MONITOR KIT: PACK | 0 refills | Status: DC

## 2018-03-10 MED ORDER — INSULIN STARTER KIT- PEN NEEDLES (ENGLISH)
1.0000 | Freq: Once | Status: AC
  Administered 2018-03-10: 1
  Filled 2018-03-10: qty 1

## 2018-03-10 MED ORDER — ALCOHOL SWABS PADS: 1.0000 "application " | MEDICATED_PAD | Freq: Four times a day (QID) | 5 refills | Status: DC

## 2018-03-10 MED ORDER — INSULIN ASPART 100 UNIT/ML ~~LOC~~ SOLN: 16.0000 [IU] | Freq: Every day | SUBCUTANEOUS | 5 refills | Status: DC

## 2018-03-10 MED ORDER — INSULIN LISPRO 100 UNIT/ML ~~LOC~~ SOLN: 16.0000 [IU] | Freq: Every day | SUBCUTANEOUS | 5 refills | Status: DC

## 2018-03-10 MED ORDER — ONETOUCH ULTRASOFT LANCETS MISC: 5 refills | Status: DC

## 2018-03-10 MED ORDER — DOCUSATE SODIUM 100 MG PO CAPS: 100.0000 mg | ORAL_CAPSULE | Freq: Every day | ORAL | 0 refills | Status: DC | PRN

## 2018-03-10 NOTE — Discharge Summary (Addendum)
Physician Discharge Summary  Patient ID: [name] MRN: [id number] DOB/AGE: January 04, 1986 32 y.o.  Admit date: 03/04/2018 Discharge date: 03/10/2018  Admission Diagnoses: Preeclampsia with headache  Discharge Diagnoses:  Preeclampsia without headache following IV fluids Gestational Diabetes  Discharged Condition: stable  Hospital Course: Patient admitted for monitoring and CBG control.  Patient's CBGs are improved and will follow up at visit in the office on 03/15/18.  Instructions given to patient. F/u with MFM tomorrow.  Consults: MFM  Significant Diagnostic Studies: n/a  Treatments: IV hydration and fioricet.  Insulin.  Discharge Exam: Blood pressure 125/71, pulse (!) 106, temperature 98 F (36.7 C), temperature source Oral, resp. rate 18, height '5\' 9"'$  (1.753 m), weight 103.4 kg, last menstrual period 08/25/2017, SpO2 97 %. General appearance: alert and no distress Resp: clear to auscultation bilaterally Cardio: regular rate and rhythm  Ext no calf tenderness  Disposition: Discharge disposition: 01-Home or Self Care       Discharge Instructions    Care order/instruction:   Complete by:  As directed    Please teach patient how to draw up insulin and then how to give herself the injections.  Thank you     Allergies as of 03/10/2018      Reactions   Latex Itching   Morphine And Related Itching   Lower doses OK   Penicillins Rash, Other (See Comments)   Childhood reaction ( unknown) Has patient had a PCN reaction causing immediate rash, facial/tongue/throat swelling, SOB or lightheadedness with hypotension: Yes Has patient had a PCN reaction causing severe rash involving mucus membranes or skin necrosis: No Has patient had a PCN reaction that required hospitalization: No Has patient had a PCN reaction occurring within the last 10 years: No If all of the above answers are "NO", then may proceed with Cephalosporin use.      Medication List    TAKE these  medications   acetaminophen 325 MG tablet Commonly known as:  TYLENOL Take 2 tablets (650 mg total) by mouth every 6 (six) hours as needed for moderate pain (Take 1-2 tablets every six hours as needed for pain).   albuterol 108 (90 Base) MCG/ACT inhaler Commonly known as:  PROVENTIL HFA;VENTOLIN HFA Inhale 1-2 puffs into the lungs every 6 (six) hours as needed for wheezing or shortness of breath.   Alcohol Swabs Pads 1 application by Does not apply route 4 (four) times daily.   blood glucose meter kit and supplies Kit Dispense based on patient and insurance preference. Use up to four times daily as directed. (FOR ICD-9 250.00, 250.01).   butalbital-acetaminophen-caffeine 50-325-40 MG tablet Commonly known as:  FIORICET, ESGIC Take 1 tablet by mouth every 6 (six) hours as needed for headache.   docusate sodium 100 MG capsule Commonly known as:  COLACE Take 1 capsule (100 mg total) by mouth daily as needed for mild constipation.      insulin lispro 100 UNIT/ML injection Commonly known as:  HUMALOG Inject 0.16 mLs (16 Units total) into the skin daily with supper.   insulin NPH Human 100 UNIT/ML injection Commonly known as:  HUMULIN N,NOVOLIN N Inject 0.18 mLs (18 Units total) into the skin at bedtime.   insulin NPH Human 100 UNIT/ML injection Commonly known as:  HUMULIN N,NOVOLIN N Inject 0.1 mLs (10 Units total) into the skin daily before breakfast. Start taking on:  03/11/2018   Insulin Syringe-Needle U-100 30G X 1/2" 1 ML Misc 1 Syringe by Does not apply route 4 (four) times daily.  NIFEdipine 30 MG 24 hr tablet Commonly known as:  PROCARDIA-XL/NIFEDICAL-XL Take 30 mg by mouth at bedtime.   onetouch ultrasoft lancets Use as instructed   prenatal multivitamin Tabs tablet Take 1 tablet by mouth daily at 12 noon.      Follow-up [facility] Follow up on 03/15/2018.   Specialty:  Obstetrics and Gynecology Why:  f/u in  office on 03/15/18 for ob visit and ultrasound Contact information: [address]. [address] [place] [address] [phone]          Signed: Delice Lesch 03/10/2018, 3:02 PM

## 2018-03-10 NOTE — Discharge Instructions (Signed)

## 2018-03-10 NOTE — Progress Notes (Addendum)
Hospital day # 6 pregnancy at [redacted]w[redacted]d--Admitted for Preeclampsia with severe features. 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  S:  Feeling better hydrated today.      Perception of contractions: none      Vaginal bleeding: none now       Vaginal discharge:  no significant change      Reports she will be comfortable self administering insulin but will need meter and supplies prescribed  O: BP 121/62 (BP Location: Left Arm)   Pulse (!) 106   Temp 98.7 F (37.1 C) (Oral)   Resp 20   Ht 5\' 9"  (1.753 m)   Wt 103.4 kg   LMP 08/25/2017   SpO2 97%   BMI 33.67 kg/m    Vitals:   03/09/18 1952 03/09/18 2345 03/10/18 0427 03/10/18 0818  BP: 131/78 127/72 121/62 121/69  Pulse: 88 96 (!) 106 (!) 102  Resp: 20 20 20 18   Temp: 98 F (36.7 C) 98 F (36.7 C) 98.7 F (37.1 C) 97.7 F (36.5 C)  TempSrc: Oral Oral Oral Oral  SpO2: 100% 94% 97% 95%  Weight:      Height:            Fetal tracings: cat 1, reactive      Contractions:   none      Uterus gravid and non-tender      Extremities: no significant edema and no signs of DVT          Labs:  CBG (last 3)  Recent Labs    03/09/18 2128 03/10/18 0547 03/10/18 0723  GLUCAP 161* 110* 84    Insulin: NPH 18 units at HS, NPH 10 units at breakfast, Novolog 16 units before supper (all new beginning 10/30  At HS)  Remains on Procardia 30mg  XL at HS IV placed yesterday for IV hydration. Pt reports large amt of output.  A: [redacted]w[redacted]d with Preeclampsia without severe features per MFM. 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 Stable New insuiin regimen  P: Continue current plan of care     Has weekly MFM appt Friday BPP 1pm previously scheduled     Dr. Su Hilt updated  Nigel Bridgeman CNM, [place] 03/10/2018 8:18 AM   NST cat 1.  Discussed discharge home and pt is agreeable.  CBGs are better but insulin my still need adjustment.  Reviewed GHTN precautions.  Pt has an appt with MFM tomorrow and will be  scheduled for an appt on Tuesday to be seen in the office.  rxs for insulin sent to pharmacy.  Will have the office arrange for the pt to get a glucometer.

## 2018-03-11 ENCOUNTER — Other Ambulatory Visit (HOSPITAL_COMMUNITY): Payer: Self-pay | Admitting: *Deleted

## 2018-03-11 ENCOUNTER — Ambulatory Visit (HOSPITAL_COMMUNITY)
Admission: RE | Admit: 2018-03-11 | Discharge: 2018-03-11 | Disposition: A | Discharge status: Home / Self Care | Payer: 59 | Source: Ambulatory Visit | Attending: Obstetrics & Gynecology | Admitting: Obstetrics & Gynecology

## 2018-03-11 ENCOUNTER — Encounter (HOSPITAL_COMMUNITY): Payer: Self-pay

## 2018-03-11 DIAGNOSIS — Z3A28 28 weeks gestation of pregnancy: Secondary | ICD-10-CM | POA: Diagnosis not present

## 2018-03-11 DIAGNOSIS — O24414 Gestational diabetes mellitus in pregnancy, insulin controlled: Secondary | ICD-10-CM | POA: Insufficient documentation

## 2018-03-11 DIAGNOSIS — O139 Gestational [pregnancy-induced] hypertension without significant proteinuria, unspecified trimester: Secondary | ICD-10-CM | POA: Diagnosis not present

## 2018-03-11 DIAGNOSIS — O133 Gestational [pregnancy-induced] hypertension without significant proteinuria, third trimester: Secondary | ICD-10-CM | POA: Insufficient documentation

## 2018-03-11 DIAGNOSIS — O99212 Obesity complicating pregnancy, second trimester: Secondary | ICD-10-CM | POA: Diagnosis not present

## 2018-03-11 DIAGNOSIS — O1492 Unspecified pre-eclampsia, second trimester: Secondary | ICD-10-CM

## 2018-03-11 HISTORY — DX: Type 2 diabetes mellitus without complications: E11.9

## 2018-03-11 HISTORY — DX: Essential (primary) hypertension: I10

## 2018-03-11 HISTORY — DX: Gestational diabetes mellitus in pregnancy, unspecified control: O24.419

## 2018-03-15 DIAGNOSIS — Z3A29 29 weeks gestation of pregnancy: Secondary | ICD-10-CM | POA: Diagnosis not present

## 2018-03-15 DIAGNOSIS — O139 Gestational [pregnancy-induced] hypertension without significant proteinuria, unspecified trimester: Secondary | ICD-10-CM | POA: Diagnosis not present

## 2018-03-17 DIAGNOSIS — Z630 Problems in relationship with spouse or partner: Secondary | ICD-10-CM | POA: Diagnosis not present

## 2018-03-18 ENCOUNTER — Encounter (HOSPITAL_COMMUNITY): Payer: Self-pay

## 2018-03-18 ENCOUNTER — Ambulatory Visit (HOSPITAL_COMMUNITY)
Admission: RE | Admit: 2018-03-18 | Discharge: 2018-03-18 | Disposition: A | Discharge status: Home / Self Care | Payer: 59 | Source: Ambulatory Visit | Attending: Obstetrics & Gynecology | Admitting: Obstetrics & Gynecology

## 2018-03-18 ENCOUNTER — Other Ambulatory Visit (HOSPITAL_COMMUNITY): Payer: Self-pay | Admitting: Maternal and Fetal Medicine

## 2018-03-18 DIAGNOSIS — O139 Gestational [pregnancy-induced] hypertension without significant proteinuria, unspecified trimester: Secondary | ICD-10-CM

## 2018-03-18 DIAGNOSIS — Z3A29 29 weeks gestation of pregnancy: Secondary | ICD-10-CM | POA: Diagnosis not present

## 2018-03-18 DIAGNOSIS — O99212 Obesity complicating pregnancy, second trimester: Secondary | ICD-10-CM | POA: Diagnosis not present

## 2018-03-18 DIAGNOSIS — O24414 Gestational diabetes mellitus in pregnancy, insulin controlled: Secondary | ICD-10-CM | POA: Diagnosis not present

## 2018-03-18 DIAGNOSIS — O1492 Unspecified pre-eclampsia, second trimester: Secondary | ICD-10-CM | POA: Diagnosis not present

## 2018-03-21 DIAGNOSIS — M545 Low back pain: Secondary | ICD-10-CM | POA: Diagnosis not present

## 2018-03-22 DIAGNOSIS — O1493 Unspecified pre-eclampsia, third trimester: Secondary | ICD-10-CM | POA: Diagnosis not present

## 2018-03-22 DIAGNOSIS — Z3A30 30 weeks gestation of pregnancy: Secondary | ICD-10-CM | POA: Diagnosis not present

## 2018-03-22 DIAGNOSIS — O139 Gestational [pregnancy-induced] hypertension without significant proteinuria, unspecified trimester: Secondary | ICD-10-CM | POA: Diagnosis not present

## 2018-03-25 ENCOUNTER — Ambulatory Visit (HOSPITAL_COMMUNITY): Admission: RE | Admit: 2018-03-25 | Payer: 59 | Source: Ambulatory Visit

## 2018-03-28 ENCOUNTER — Encounter (HOSPITAL_COMMUNITY): Payer: Self-pay

## 2018-03-28 ENCOUNTER — Ambulatory Visit (HOSPITAL_COMMUNITY)
Admission: RE | Admit: 2018-03-28 | Discharge: 2018-03-28 | Disposition: A | Discharge status: Home / Self Care | Payer: 59 | Source: Ambulatory Visit | Attending: Obstetrics & Gynecology | Admitting: Obstetrics & Gynecology

## 2018-03-28 DIAGNOSIS — Z3A30 30 weeks gestation of pregnancy: Secondary | ICD-10-CM | POA: Diagnosis not present

## 2018-03-28 DIAGNOSIS — O139 Gestational [pregnancy-induced] hypertension without significant proteinuria, unspecified trimester: Secondary | ICD-10-CM

## 2018-03-28 DIAGNOSIS — O24414 Gestational diabetes mellitus in pregnancy, insulin controlled: Secondary | ICD-10-CM | POA: Insufficient documentation

## 2018-03-28 DIAGNOSIS — O133 Gestational [pregnancy-induced] hypertension without significant proteinuria, third trimester: Secondary | ICD-10-CM | POA: Diagnosis not present

## 2018-03-28 DIAGNOSIS — O99212 Obesity complicating pregnancy, second trimester: Secondary | ICD-10-CM | POA: Diagnosis not present

## 2018-03-28 DIAGNOSIS — O99213 Obesity complicating pregnancy, third trimester: Secondary | ICD-10-CM | POA: Diagnosis not present

## 2018-03-28 DIAGNOSIS — O1403 Mild to moderate pre-eclampsia, third trimester: Secondary | ICD-10-CM | POA: Diagnosis not present

## 2018-03-28 DIAGNOSIS — O1493 Unspecified pre-eclampsia, third trimester: Secondary | ICD-10-CM | POA: Diagnosis not present

## 2018-03-29 DIAGNOSIS — O1493 Unspecified pre-eclampsia, third trimester: Secondary | ICD-10-CM | POA: Diagnosis not present

## 2018-04-01 ENCOUNTER — Ambulatory Visit (HOSPITAL_COMMUNITY): Payer: 59

## 2018-04-01 DIAGNOSIS — O1493 Unspecified pre-eclampsia, third trimester: Secondary | ICD-10-CM | POA: Diagnosis not present

## 2018-04-04 ENCOUNTER — Ambulatory Visit (HOSPITAL_COMMUNITY): Admission: RE | Admit: 2018-04-04 | Payer: 59 | Source: Ambulatory Visit

## 2018-04-04 DIAGNOSIS — R52 Pain, unspecified: Secondary | ICD-10-CM | POA: Diagnosis not present

## 2018-04-04 DIAGNOSIS — Z3A31 31 weeks gestation of pregnancy: Secondary | ICD-10-CM | POA: Diagnosis not present

## 2018-04-04 DIAGNOSIS — O133 Gestational [pregnancy-induced] hypertension without significant proteinuria, third trimester: Secondary | ICD-10-CM | POA: Diagnosis not present

## 2018-04-04 DIAGNOSIS — O1493 Unspecified pre-eclampsia, third trimester: Secondary | ICD-10-CM | POA: Diagnosis not present

## 2018-04-05 DIAGNOSIS — Z3483 Encounter for supervision of other normal pregnancy, third trimester: Secondary | ICD-10-CM | POA: Diagnosis not present

## 2018-04-05 DIAGNOSIS — Z3482 Encounter for supervision of other normal pregnancy, second trimester: Secondary | ICD-10-CM | POA: Diagnosis not present

## 2018-04-06 DIAGNOSIS — O1493 Unspecified pre-eclampsia, third trimester: Secondary | ICD-10-CM | POA: Diagnosis not present

## 2018-04-08 ENCOUNTER — Ambulatory Visit (HOSPITAL_COMMUNITY): Payer: 59

## 2018-04-10 ENCOUNTER — Inpatient Hospital Stay (HOSPITAL_COMMUNITY)
Admission: AD | Admit: 2018-04-10 | Discharge: 2018-04-10 | Disposition: A | Discharge status: Home / Self Care | Payer: 59 | Source: Ambulatory Visit | Attending: Obstetrics and Gynecology | Admitting: Obstetrics and Gynecology

## 2018-04-10 ENCOUNTER — Other Ambulatory Visit: Payer: Self-pay

## 2018-04-10 DIAGNOSIS — O24414 Gestational diabetes mellitus in pregnancy, insulin controlled: Secondary | ICD-10-CM

## 2018-04-10 DIAGNOSIS — R109 Unspecified abdominal pain: Secondary | ICD-10-CM | POA: Diagnosis present

## 2018-04-10 DIAGNOSIS — O4703 False labor before 37 completed weeks of gestation, third trimester: Secondary | ICD-10-CM | POA: Insufficient documentation

## 2018-04-10 DIAGNOSIS — N898 Other specified noninflammatory disorders of vagina: Secondary | ICD-10-CM

## 2018-04-10 DIAGNOSIS — O479 False labor, unspecified: Secondary | ICD-10-CM

## 2018-04-10 DIAGNOSIS — O24419 Gestational diabetes mellitus in pregnancy, unspecified control: Secondary | ICD-10-CM | POA: Diagnosis not present

## 2018-04-10 DIAGNOSIS — Z87891 Personal history of nicotine dependence: Secondary | ICD-10-CM | POA: Diagnosis not present

## 2018-04-10 DIAGNOSIS — Z880 Allergy status to penicillin: Secondary | ICD-10-CM | POA: Insufficient documentation

## 2018-04-10 DIAGNOSIS — O1493 Unspecified pre-eclampsia, third trimester: Secondary | ICD-10-CM | POA: Diagnosis not present

## 2018-04-10 DIAGNOSIS — O99613 Diseases of the digestive system complicating pregnancy, third trimester: Secondary | ICD-10-CM | POA: Insufficient documentation

## 2018-04-10 DIAGNOSIS — Z3A32 32 weeks gestation of pregnancy: Secondary | ICD-10-CM | POA: Insufficient documentation

## 2018-04-10 DIAGNOSIS — K219 Gastro-esophageal reflux disease without esophagitis: Secondary | ICD-10-CM | POA: Insufficient documentation

## 2018-04-10 LAB — COMPREHENSIVE METABOLIC PANEL
ALT: 14 U/L (ref 0–44)
AST: 16 U/L (ref 15–41)
Albumin: 3 g/dL — ABNORMAL LOW (ref 3.5–5.0)
Alkaline Phosphatase: 128 U/L — ABNORMAL HIGH (ref 38–126)
Anion gap: 7 (ref 5–15)
BUN: <5 mg/dL — ABNORMAL LOW (ref 6–20)
CO2: 23 mmol/L (ref 22–32)
Calcium: 8.7 mg/dL — ABNORMAL LOW (ref 8.9–10.3)
Chloride: 105 mmol/L (ref 98–111)
Creatinine, Ser: 0.56 mg/dL (ref 0.44–1.00)
GFR calc non Af Amer: >60 mL/min (ref >60)
Glucose, Bld: 80 mg/dL (ref 70–99)
Potassium: 3.8 mmol/L (ref 3.5–5.1)
Sodium: 135 mmol/L (ref 135–145)
Total Bilirubin: 0.4 mg/dL (ref 0.3–1.2)
Total Protein: 6.6 g/dL (ref 6.5–8.1)

## 2018-04-10 LAB — URINALYSIS, ROUTINE W REFLEX MICROSCOPIC
Bilirubin Urine: NEGATIVE
Glucose, UA: NEGATIVE mg/dL
Hgb urine dipstick: NEGATIVE
KETONES UR: NEGATIVE mg/dL
Leukocytes, UA: NEGATIVE
NITRITE: NEGATIVE
Protein, ur: NEGATIVE mg/dL
Specific Gravity, Urine: 1.006 (ref 1.005–1.030)
pH: 6 (ref 5.0–8.0)

## 2018-04-10 LAB — WET PREP, GENITAL
Clue Cells Wet Prep HPF POC: NONE SEEN
Sperm: NONE SEEN
Trich, Wet Prep: NONE SEEN
YEAST WET PREP: NONE SEEN

## 2018-04-10 LAB — POCT FERN TEST: POCT Fern Test: NEGATIVE

## 2018-04-10 MED ORDER — LIDOCAINE VISCOUS HCL 2 % MT SOLN
15.0000 mL | Freq: Once | OROMUCOSAL | Status: AC
  Administered 2018-04-10: 15 mL via ORAL
  Filled 2018-04-10: qty 15

## 2018-04-10 MED ORDER — HYOSCYAMINE SULFATE 0.125 MG PO TBDP
0.1250 mg | ORAL_TABLET | Freq: Once | ORAL | Status: AC
  Administered 2018-04-10: 0.125 mg via SUBLINGUAL
  Filled 2018-04-10 (×2): qty 1

## 2018-04-10 MED ORDER — ALUM & MAG HYDROXIDE-SIMETH 200-200-20 MG/5ML PO SUSP
30.0000 mL | Freq: Once | ORAL | Status: AC
  Administered 2018-04-10: 30 mL via ORAL
  Filled 2018-04-10: qty 30

## 2018-04-10 NOTE — Discharge Instructions (Signed)

## 2018-04-10 NOTE — MAU Note (Signed)
Patient refused to see MAU provider and she has contacted her private provider who is coming to see her.

## 2018-04-10 NOTE — Progress Notes (Signed)
Patient has good relief from the GI cocktail.

## 2018-04-10 NOTE — MAU Note (Signed)
Patient c/o not feeling well, started this a.m.  She took her BP at home and it was elevated 145/85. Pain is a 7 in her lower abdomen which is sharp and comes and goes.  She states she has good fetal movement no vaginal bleeding or leakage of fluid.

## 2018-04-10 NOTE — MAU Provider Note (Signed)
Chief Complaint:  Hypertension; Nausea; and Abdominal Pain   First Provider Initiated Contact with Patient 04/10/18 1619     HPI: [name] is a 32 y.o. G3P0020 at 65w4dwho has DNA2 controlled with insulin, [facility], pcr 0.211, PCR was 0.34 and high bp reading on 10/25 presents to maternity admissions reporting increased vaginal discharge. Last time had intercourse was 1 month ago. Pt denies dysuria, no hematuria, no vaginal burning or itching.  Pt also endorses having pain in her epigastric region radiates up her center of her chest, pt had GERD takes Protonix 40 QD and last took it last night. Pt endorses having cramps felt in uterus. Pt denies taking any pain meds due to wanting to me checked out by the MAU first. Pt stated she had an elevated BP at home which was 140/90, pt endorses taking her procardia 30 mg XL today for her Preeclampsia. Pt denies HA, v, d, rashes, fever, vision changes, sob, or RUQ pain. Denies vaginal bleeding. Good fetal movement. Pt has consulted with MFM and recommendation were made to deliver at 37 weeks.   Per MFM: Maternal-Fetal Medicine (Follow-up consultation) MRN: [id number] Provider: SDelsa Bern MD Ms. [name] G1 P0 at 27w 5d gestation, was admitted 3 days ago with c/o persistent headache. She had MFM consultation (Dr. JYates Decamp on 02/25/18 after she had a diagnosis of gestational hypertension.  Your office evaluation showed increased proteinuria. She has a new diagnosis of gestational diabetes. Patient has intermittent mild to moderate headaches. No visual disturbances or right upper quadrant pain or vaginal bleeding. No history of nausea or vomiting.  She takes Procardia XL 30 mg daily and her blood pressures are well controlled. She received a course of betamethasone last week. Following hyperglycemia, she had consultation with diabetic educator. PMH: No history of chronic  hypertension. Allergies: NKDA. Social: Denies tobacco or drug or alcohol use.  Prenatal course: Aneuploidy screen is negative. P/E: Patient is comfortably lying in bed; not in distress. BP: 108-140/66-84 mm Hg. HEENT: Normal. Abd: Soft gravid uterus; no tenderness. No flank tenderness. No pedal edema. NST reactive for this gestational age. Labs: Hb 12.3, Hct 35.6, PLT332, WBC 11.8, electrolytes normal, ALT 14, AST 21, creatinine 0.66. Glucose fasting 166. Postprandial elevations up to 300. On ultrasound performed on 03/04/18, antenatal testing was reassuring.  I counseled the patient on the following: Gestational diabetes:  I explained the diagnosis and that 3-hour GTT is not necessary given that she has increased blood glucose.  -Emphasized the importance of diet and insulin in achieving good control. -Possible complications of poor control can lead to adverse fetal and neonatal outcomes including stillbirth. Macrosomia, shoulder dystocia neurological injuries can occur at birth.  -I discussed blood glucose monitoring and normal parameters. -I also discussed hypoglycemia and corrective measures. -I recommend initiating insulin NPH 12 units at night and Humalog 8 units with meals. It is possible that she may require less insulin once steroid effect wears off (varies in different patients). -I recommend evaluation of fasting and postprandial (1 or 2 hour) blood glucose levels and sliding scale correction for increased postprandial glucose. -Inpatient management for up to 48 hours or adequate glucose control and this gives an opportunity for patients to learn self-monitoring. -Discussed postpartum screening to rule out type 2 diabetes.  Preeclampsia without severe features: -I explained the diagnosis and possible maternal complications of severe features including eclampsia and end-organ failure. -We discussed timing of delivery based on  the presence of absence of severe  features. -Outpatient management is possible with normal or mild hypertension. -Patient is aware of symptoms of severe features that should prompt her to come to the hospital.  Recommendations: -Continue Procardia. -Initiate insulin NPH 12 units at night and Humalog 8 units with each meal; adjust insulin dosage as needed. -Blood glucose fasting and 1 or 2-hour postprandial (4 times daily). -Inpatient management for 48 hours to achieve good control of diabetes.        Electronically signed by Tama High, MD at 03/07/2018 6:25 PM    Pregnancy Course:   Past Medical History:  Diagnosis Date  . Asthma   . Diabetes mellitus without complication ([place])   . Gestational diabetes   . Hypertension   . No pertinent past medical history   . Ovarian cyst    OB History  Gravida Para Term Preterm AB Living  3 0 0 0 2 0  SAB TAB Ectopic Multiple Live Births  0 2 0 0      # Outcome Date GA Lbr Len/2nd Weight Sex Delivery Anes PTL Lv  3 Current           2 TAB           1 TAB            Past Surgical History:  Procedure Laterality Date  . BACK SURGERY    . DILATION AND CURETTAGE OF UTERUS    . INDUCED ABORTION     x2  . laporocscopy     Family History  Problem Relation Age of Onset  . Hypertension Mother   . Hypertension Father   . Anesthesia problems Neg Hx   . Hypotension Neg Hx   . Malignant hyperthermia Neg Hx   . Pseudochol deficiency Neg Hx    Social History   Tobacco Use  . Smoking status: Former Smoker    Packs/day: 0.50    Last attempt to quit: 02/26/2016    Years since quitting: 2.1  . Smokeless tobacco: Never Used  Substance Use Topics  . Alcohol use: No  . Drug use: No   Allergies  Allergen Reactions  . Latex Itching  . Morphine And Related Itching    Lower doses OK  . Penicillins Rash and Other (See Comments)    Childhood reaction ( unknown) Has patient had a PCN reaction causing immediate rash, facial/tongue/throat swelling, SOB or  lightheadedness with hypotension: Yes Has patient had a PCN reaction causing severe rash involving mucus membranes or skin necrosis: No Has patient had a PCN reaction that required hospitalization: No Has patient had a PCN reaction occurring within the last 10 years: No If all of the above answers are "NO", then may proceed with Cephalosporin use.    Medications Prior to Admission  Medication Sig Dispense Refill Last Dose  . acetaminophen (TYLENOL) 325 MG tablet Take 2 tablets (650 mg total) by mouth every 6 (six) hours as needed for moderate pain (Take 1-2 tablets every six hours as needed for pain). (Patient not taking: Reported on 03/11/2018) 30 tablet 0 Not Taking  . albuterol (PROVENTIL HFA;VENTOLIN HFA) 108 (90 Base) MCG/ACT inhaler Inhale 1-2 puffs into the lungs every 6 (six) hours as needed for wheezing or shortness of breath.   Taking  . Alcohol Swabs PADS 1 application by Does not apply route 4 (four) times daily. 100 each 5 Taking  . blood glucose meter kit and supplies KIT Dispense based on patient and insurance preference.  Use up to four times daily as directed. (FOR ICD-9 250.00, 250.01). 1 each 0 Taking  . butalbital-acetaminophen-caffeine (FIORICET, ESGIC) 50-325-40 MG tablet Take 1 tablet by mouth every 6 (six) hours as needed for headache. 30 tablet 0 Taking  . docusate sodium (COLACE) 100 MG capsule Take 1 capsule (100 mg total) by mouth daily as needed for mild constipation. (Patient not taking: Reported on 03/11/2018) 10 capsule 0 Not Taking  . insulin aspart (NOVOLOG) 100 UNIT/ML injection Inject 16 Units into the skin daily before supper. (Patient not taking: Reported on 03/18/2018) 10 mL 5 Not Taking  . insulin lispro (HUMALOG) 100 UNIT/ML injection Inject 0.16 mLs (16 Units total) into the skin daily with supper. 10 mL 5 Taking  . insulin NPH Human (HUMULIN N,NOVOLIN N) 100 UNIT/ML injection Inject 0.1 mLs (10 Units total) into the skin daily before breakfast. 10 mL 5 Taking   . insulin NPH Human (HUMULIN N,NOVOLIN N) 100 UNIT/ML injection Inject 0.18 mLs (18 Units total) into the skin at bedtime. 10 mL 5 Taking  . Insulin Syringe-Needle U-100 (SAFETY INSULIN SYRINGES) 30G X 1/2" 1 ML MISC 1 Syringe by Does not apply route 4 (four) times daily. 100 each 5 Taking  . Lancets (ONETOUCH ULTRASOFT) lancets Use as instructed 100 each 5 Taking  . Magnesium 500 MG TABS Take by mouth.   Taking  . NIFEdipine (PROCARDIA-XL/NIFEDICAL-XL) 30 MG 24 hr tablet Take 30 mg by mouth at bedtime.   Taking  . Prenatal Vit-Fe Fumarate-FA (PRENATAL MULTIVITAMIN) TABS tablet Take 1 tablet by mouth daily at 12 noon.   Taking    I have reviewed patient's Past Medical Hx, Surgical Hx, Family Hx, Social Hx, medications and allergies.   ROS:  Review of Systems  Constitutional: Negative.   HENT: Negative.   Eyes: Negative.   Respiratory: Negative.   Cardiovascular: Negative.   Gastrointestinal: Positive for abdominal pain and nausea.  Endocrine: Negative.   Genitourinary: Positive for vaginal discharge.  Musculoskeletal: Negative.   Skin: Negative.   Allergic/Immunologic: Negative.   Neurological: Negative.   Hematological: Negative.   Psychiatric/Behavioral: Negative.     Physical Exam   Patient Vitals for the past 24 hrs:  BP Temp Temp src Pulse Resp SpO2 Height Weight  04/10/18 1743 125/75 - - 89 - - - -  04/10/18 1532 - - - - - 100 % - -  04/10/18 1528 (!) 149/87 98.1 F (36.7 C) Oral (!) 111 16 100 % '5\' 9"'$  (1.753 m) 108.9 kg   Constitutional: Well-developed, well-nourished female in no acute distress.  Cardiovascular: normal rate Respiratory: normal effort GI: Abd soft, non-tender, gravid appropriate for gestational age. Pos BS x 4 MS: Extremities nontender, no edema, normal ROM, 2+ patellar dtr , no clonus.  Neurologic: Alert and oriented x 4.  GU: Neg CVAT.  Pelvic: NEFG, physiologic discharge, no blood. No CMT  NST: FHR baseline 140 bpm, Variability: moderate,  Accelerations:present, Decelerations:  Absent= Cat 1/Reactive UC:   none, irritable  SVE: closed/20/high   .   Labs: Results for orders placed or performed during the hospital encounter of 04/10/18 (from the past 24 hour(s))  Urinalysis, Routine w reflex microscopic     Status: None   Collection Time: 04/10/18  3:53 PM  Result Value Ref Range   Color, Urine YELLOW YELLOW   APPearance CLEAR CLEAR   Specific Gravity, Urine 1.006 1.005 - 1.030   pH 6.0 5.0 - 8.0   Glucose, UA NEGATIVE NEGATIVE mg/dL  Hgb urine dipstick NEGATIVE NEGATIVE   Bilirubin Urine NEGATIVE NEGATIVE   Ketones, ur NEGATIVE NEGATIVE mg/dL   Protein, ur NEGATIVE NEGATIVE mg/dL   Nitrite NEGATIVE NEGATIVE   Leukocytes, UA NEGATIVE NEGATIVE  Fern Test     Status: Normal   Collection Time: 04/10/18  4:40 PM  Result Value Ref Range   POCT Fern Test Negative = intact amniotic membranes   Wet prep, genital     Status: Abnormal   Collection Time: 04/10/18  4:42 PM  Result Value Ref Range   Yeast Wet Prep HPF POC NONE SEEN NONE SEEN   Trich, Wet Prep NONE SEEN NONE SEEN   Clue Cells Wet Prep HPF POC NONE SEEN NONE SEEN   WBC, Wet Prep HPF POC MANY (A) NONE SEEN   Sperm NONE SEEN   Comprehensive metabolic panel     Status: Abnormal   Collection Time: 04/10/18  6:09 PM  Result Value Ref Range   Sodium 135 135 - 145 mmol/L   Potassium 3.8 3.5 - 5.1 mmol/L   Chloride 105 98 - 111 mmol/L   CO2 23 22 - 32 mmol/L   Glucose, Bld 80 70 - 99 mg/dL   BUN <5 (L) 6 - 20 mg/dL   Creatinine, Ser 0.56 0.44 - 1.00 mg/dL   Calcium 8.7 (L) 8.9 - 10.3 mg/dL   Total Protein 6.6 6.5 - 8.1 g/dL   Albumin 3.0 (L) 3.5 - 5.0 g/dL   AST 16 15 - 41 U/L   ALT 14 0 - 44 U/L   Alkaline Phosphatase 128 (H) 38 - 126 U/L   Total Bilirubin 0.4 0.3 - 1.2 mg/dL   GFR calc non Af Amer >60 >60 mL/min   GFR calc Af Amer >60 >60 mL/min   Anion gap 7 5 - 15    Imaging:  [place] Mfm Fetal Bpp W/nonstress  Result Date:  03/18/2018 ----------------------------------------------------------------------  OBSTETRICS REPORT                       (Signed Final 03/18/2018 03:48 pm) ---------------------------------------------------------------------- Patient Info  ID #:       [id number]                          D.O.B.:  02-Jul-1985 (32 yrs)  Name:       LORINA DUFFNER [facility]                Visit Date: 03/18/2018 12:43 pm ---------------------------------------------------------------------- Performed By  Performed By:     Novella Rob        Ref. Address:     [place]                                                             [address]  [place]                                                             [place]  Attending:        Tama High MD        Location:         [facility]  Referred By:      De Nurse ---------------------------------------------------------------------- Orders   #  Description                          Code         Ordered By   1  [place] MFM FETAL BPP                     73710.6      Lora Paula      W/NONSTRESS  ----------------------------------------------------------------------   #  Order #                    Accession #                 Episode #   1  [id number]                  [id number]                  [id number]  ---------------------------------------------------------------------- Indications   Gestational diabetes in pregnancy, insulin     O24.414   controlled (Humalog, Humalin)   Obesity complicating pregnancy, second         O99.212   trimester (pregravid BMI 31)   Hypertension - Gestational (procardia)         O13.9   Mild to moderate preeclampsia, unspecified     O14.00   trimester   Pre-eclampsia (Mild)                           O14.90   [redacted] weeks gestation of pregnancy                Z3A.29  ---------------------------------------------------------------------- Vital Signs  Weight (lb): 223                                Height:        5'9"  BMI:         32.93 ---------------------------------------------------------------------- Fetal Evaluation  Num Of Fetuses:         1  Fetal Heart Rate(bpm):  145  Cardiac Activity:       Observed  Presentation:           Cephalic  Placenta:               Posterior  Amniotic Fluid  AFI FV:      Within normal limits  AFI Sum(cm)     %Tile       Largest Pocket(cm)  15.57           56          5.19  RUQ(cm)       RLQ(cm)       LUQ(cm)        LLQ(cm)  5.19          2.79          2.95           4.64 ---------------------------------------------------------------------- Biophysical Evaluation  Amniotic F.V:   Within normal limits       F. Tone:        Observed  F. Movement:    Observed                   N.S.T:          Reactive  F. Breathing:   Not Observed               Score:          8/10 ---------------------------------------------------------------------- OB History  Blood Type:    0  Gravidity:    3         Term:   0        Prem:   0        SAB:   0  TOP:          2       Ectopic:  0        Living: 0 ---------------------------------------------------------------------- Gestational Age  LMP:           29w 2d        Date:  08/25/17                 EDD:   06/01/18  Best:          29w 2d     Det. By:  LMP  (08/25/17)          EDD:   06/01/18 ---------------------------------------------------------------------- Anatomy  Thoracic:              Appears normal         Kidneys:                Appear normal  Stomach:               Appears normal, left   Bladder:                Appears normal                         sided  Abdomen:               Appears normal ---------------------------------------------------------------------- Cervix Uterus Adnexa  Cervix  Not visualized (advanced GA >24wks) ---------------------------------------------------------------------- Impression  Preeclampsia without severe features. Her blood pressures  are within normal range.  Gestational diabetes: Patient  takes insulin NPH 18 units at  night and 10 units in the morning. She also takes Humalog  10 units with dinner and 2 to 4 units with breakfast and lunch.  She reports her blood glucose levels are within normal range.  Amniotic fluid is normal and good fetal activity is seen. Fetal  breathing movements are not seen over 30-min observation.  NST is reactive. BPP 8/10.  I reassured the patient of the findings. ---------------------------------------------------------------------- Recommendations  Continue weekly antenatal testing till delivery. ----------------------------------------------------------------------                  Tama High, MD Electronically Signed Final Report   03/18/2018 03:48 pm ----------------------------------------------------------------------  [place] Mfm Fetal Bpp Wo Non Stress  Result Date: 03/28/2018 ----------------------------------------------------------------------  OBSTETRICS REPORT                    (  Corrected Final 03/28/2018 05:20 pm) ---------------------------------------------------------------------- Patient Info  ID #:       [id number]                          D.O.B.:  [birth year] (32 yrs)  Name:       MIKELLA LINSLEY [facility]                Visit Date: 03/28/2018 04:25 pm ---------------------------------------------------------------------- Performed By  Performed By:     Novella Rob        Ref. Address:     [place]                                                             [address]                                                             [place]                                                             [place]  Attending:        Lajoyce Lauber      Location:         [facility]                    MD  Referred By:      De Nurse ---------------------------------------------------------------------- Orders   #  Description                          Code         Ordered By   1  [place] MFM OB FOLLOW UP                  93818.29     RAVI  SHANKAR   2  [place] MFM FETAL BPP WO NON              93716.96     RAVI [facility]      STRESS  ----------------------------------------------------------------------   #  Order #                    Accession #                 Episode #   1  [id number]                  [id number]                  [id number]   2  [id number]                  [id number]  [id number]  ---------------------------------------------------------------------- Indications   Gestational diabetes in pregnancy, insulin     O24.414   controlled (Humalog, Humalin)   Obesity complicating pregnancy, second         O99.212   trimester (pregravid BMI 31)   Hypertension - Gestational (procardia)         O13.9   Mild to moderate preeclampsia, unspecified     O14.00   trimester   Pre-eclampsia (Mild)                           O14.90   [redacted] weeks gestation of pregnancy                Z3A.30  ---------------------------------------------------------------------- Vital Signs  Weight (lb): 234                               Height:        5'9"  BMI:         34.55 ---------------------------------------------------------------------- Fetal Evaluation  Num Of Fetuses:         1  Fetal Heart Rate(bpm):  138  Cardiac Activity:       Observed  Presentation:           Cephalic  Placenta:               Posterior  P. Cord Insertion:      Previously Visualized  Amniotic Fluid  AFI FV:      Within normal limits  AFI Sum(cm)     %Tile       Largest Pocket(cm)  17.53           65          6.12  RUQ(cm)       RLQ(cm)       LUQ(cm)        LLQ(cm)  6.12          3.04          3.74           4.63 ---------------------------------------------------------------------- Biophysical Evaluation  Amniotic F.V:   Within normal limits       F. Tone:        Observed  F. Movement:    Observed                   Score:          8/8  F. Breathing:   Observed ---------------------------------------------------------------------- Biometry  BPD:      79.9  mm     G. Age:  32w 0d         79   %    CI:        78.42   %    70 - 86                                                          FL/HC:      20.0   %    19.3 - 21.3  HC:      285.4  mm     G. Age:  31w 2d         31  %    HC/AC:  1.05        0.96 - 1.17  AC:       271   mm     G. Age:  31w 1d         60  %    FL/BPD:     71.3   %    71 - 87  FL:         57  mm     G. Age:  29w 6d         17  %    FL/AC:      21.0   %    20 - 24  HUM:      54.7  mm     G. Age:  31w 6d         74  %  LV:        3.8  mm  Est. FW:    1655  gm    3 lb 10 oz      57  % ---------------------------------------------------------------------- OB History  Blood Type:    0  Gravidity:    3         Term:   0        Prem:   0        SAB:   0  TOP:          2       Ectopic:  0        Living: 0 ---------------------------------------------------------------------- Gestational Age  LMP:           30w 5d        Date:  08/25/17                 EDD:   06/01/18  U/S Today:     31w 1d                                        EDD:   05/29/18  Best:          30w 5d     Det. By:  LMP  (08/25/17)          EDD:   06/01/18 ---------------------------------------------------------------------- Anatomy  Cranium:               Appears normal         Aortic Arch:            Previously seen  Cavum:                 Appears normal         Ductal Arch:            Previously seen  Ventricles:            Appears normal         Diaphragm:              Appears normal  Choroid Plexus:        Previously seen        Stomach:                Appears normal, left  sided  Cerebellum:            Previously seen        Abdomen:                Appears normal  Posterior Fossa:       Previously seen        Abdominal Wall:         Previously seen  Nuchal Fold:           Not applicable (>62    Cord Vessels:           Previously seen                         wks GA)  Face:                  Orbits and profile     Kidneys:                Appear normal                          previously seen  Lips:                  Previously seen        Bladder:                Appears normal  Thoracic:              Appears normal         Spine:                  Previously seen                         Previously seen  Heart:                 Previously seen        Upper Extremities:      Previously seen  RVOT:                  Previously seen        Lower Extremities:      Previously seen  LVOT:                  Previously seen  Other:  Heels visualized. Nasal bone visualized. Female gender. ---------------------------------------------------------------------- Cervix Uterus Adnexa  Cervix  Not visualized (advanced GA >24wks) ---------------------------------------------------------------------- Comments  U/S images reviewed. Findings reviewed with patient.  Appropriate fetal growth is noted.   No fetal abnormalities are  seen.  No evidence of fetal compromise is found on BPP  today.  BP - 130/72.  Patient denies headaches, visual disturbance,  mid-epigastric or RUQ pain.  Questions answered.  Patient was concerned about  anesthesia/analgesia in labor due to her previous low back  surgery.  Anethesia consult recommended to include a plan  for labor.  10 minutes spent face to face with patient.  Recommendations: 1) Serial U/S every 4 weeks for fetal  growth 2) Weekly BPP 3) delivery @ 37 weeks (delivery  earlier if maternal or fetal deterioration) 4) Anesthesia  consultation (to be arranged by OB) ---------------------------------------------------------------------- Recommendations  1) Serial U/S every 4 weeks for fetal growth 2) Weekly BPP  3) delivery @ 37 weeks (delivery earlier if maternal or fetal  deterioration) ----------------------------------------------------------------------  Lajoyce Lauber, MD Electronically Signed Corrected Final Report  03/28/2018 05:20 pm ----------------------------------------------------------------------  [place] Mfm Ob Follow  Up  Result Date: 03/28/2018 ----------------------------------------------------------------------  OBSTETRICS REPORT                    (Corrected Final 03/28/2018 05:20 pm) ---------------------------------------------------------------------- Patient Info  ID #:       [id number]                          D.O.B.:  June 11, 1985 (32 yrs)  Name:       LORALIE MALTA [facility]                Visit Date: 03/28/2018 04:25 pm ---------------------------------------------------------------------- Performed By  Performed By:     Novella Rob        Ref. Address:     [place]                                                             [address]                                                             [place]                                                             [place]  Attending:        Lajoyce Lauber      Location:         [facility]                    MD  Referred By:      De Nurse ---------------------------------------------------------------------- Orders   #  Description                          Code         Ordered By   1  [place] MFM OB FOLLOW UP                  40814.48     RAVI SHANKAR   2  [place] MFM FETAL BPP WO NON              18563.14     RAVI [facility]      STRESS  ----------------------------------------------------------------------   #  Order #                    Accession #                 Episode #   1  [id number]                  [id number]  [id number]   2  [id number]                  [id number]                  [id number]  ---------------------------------------------------------------------- Indications   Gestational diabetes in pregnancy, insulin     O24.414   controlled (Humalog, Humalin)   Obesity complicating pregnancy, second         O99.212   trimester (pregravid BMI 31)   Hypertension - Gestational (procardia)         O13.9   Mild to moderate preeclampsia, unspecified     O14.00   trimester   Pre-eclampsia (Mild)                           O14.90    [redacted] weeks gestation of pregnancy                Z3A.30  ---------------------------------------------------------------------- Vital Signs  Weight (lb): 234                               Height:        5'9"  BMI:         34.55 ---------------------------------------------------------------------- Fetal Evaluation  Num Of Fetuses:         1  Fetal Heart Rate(bpm):  138  Cardiac Activity:       Observed  Presentation:           Cephalic  Placenta:               Posterior  P. Cord Insertion:      Previously Visualized  Amniotic Fluid  AFI FV:      Within normal limits  AFI Sum(cm)     %Tile       Largest Pocket(cm)  17.53           65          6.12  RUQ(cm)       RLQ(cm)       LUQ(cm)        LLQ(cm)  6.12          3.04          3.74           4.63 ---------------------------------------------------------------------- Biophysical Evaluation  Amniotic F.V:   Within normal limits       F. Tone:        Observed  F. Movement:    Observed                   Score:          8/8  F. Breathing:   Observed ---------------------------------------------------------------------- Biometry  BPD:      79.9  mm     G. Age:  32w 0d         79  %    CI:        78.42   %    70 - 86                                                          FL/HC:      20.0   %  19.3 - 21.3  HC:      285.4  mm     G. Age:  31w 2d         31  %    HC/AC:      1.05        0.96 - 1.17  AC:       271   mm     G. Age:  31w 1d         60  %    FL/BPD:     71.3   %    71 - 87  FL:         57  mm     G. Age:  29w 6d         17  %    FL/AC:      21.0   %    20 - 24  HUM:      54.7  mm     G. Age:  31w 6d         74  %  LV:        3.8  mm  Est. FW:    1655  gm    3 lb 10 oz      57  % ---------------------------------------------------------------------- OB History  Blood Type:    0  Gravidity:    3         Term:   0        Prem:   0        SAB:   0  TOP:          2       Ectopic:  0        Living: 0  ---------------------------------------------------------------------- Gestational Age  LMP:           30w 5d        Date:  08/25/17                 EDD:   06/01/18  U/S Today:     31w 1d                                        EDD:   05/29/18  Best:          30w 5d     Det. By:  LMP  (08/25/17)          EDD:   06/01/18 ---------------------------------------------------------------------- Anatomy  Cranium:               Appears normal         Aortic Arch:            Previously seen  Cavum:                 Appears normal         Ductal Arch:            Previously seen  Ventricles:            Appears normal         Diaphragm:              Appears normal  Choroid Plexus:        Previously seen        Stomach:                Appears normal, left  sided  Cerebellum:            Previously seen        Abdomen:                Appears normal  Posterior Fossa:       Previously seen        Abdominal Wall:         Previously seen  Nuchal Fold:           Not applicable (>96    Cord Vessels:           Previously seen                         wks GA)  Face:                  Orbits and profile     Kidneys:                Appear normal                         previously seen  Lips:                  Previously seen        Bladder:                Appears normal  Thoracic:              Appears normal         Spine:                  Previously seen                         Previously seen  Heart:                 Previously seen        Upper Extremities:      Previously seen  RVOT:                  Previously seen        Lower Extremities:      Previously seen  LVOT:                  Previously seen  Other:  Heels visualized. Nasal bone visualized. Female gender. ---------------------------------------------------------------------- Cervix Uterus Adnexa  Cervix  Not visualized (advanced GA >24wks) ----------------------------------------------------------------------  Comments  U/S images reviewed. Findings reviewed with patient.  Appropriate fetal growth is noted.   No fetal abnormalities are  seen.  No evidence of fetal compromise is found on BPP  today.  BP - 130/72.  Patient denies headaches, visual disturbance,  mid-epigastric or RUQ pain.  Questions answered.  Patient was concerned about  anesthesia/analgesia in labor due to her previous low back  surgery.  Anethesia consult recommended to include a plan  for labor.  10 minutes spent face to face with patient.  Recommendations: 1) Serial U/S every 4 weeks for fetal  growth 2) Weekly BPP 3) delivery @ 37 weeks (delivery  earlier if maternal or fetal deterioration) 4) Anesthesia  consultation (to be arranged by OB) ---------------------------------------------------------------------- Recommendations  1) Serial U/S every 4 weeks for fetal growth 2) Weekly BPP  3) delivery @ 37 weeks (delivery earlier if maternal or fetal  deterioration) ----------------------------------------------------------------------  Lajoyce Lauber, MD Electronically Signed Corrected Final Report  03/28/2018 05:20 pm ----------------------------------------------------------------------   MAU Course: Orders Placed This Encounter  Procedures  . Wet prep, genital  . Urinalysis, Routine w reflex microscopic  . Comprehensive metabolic panel  . Diet - low sodium heart healthy  . Increase activity slowly  . Call MD for:  . Call MD for:  temperature >100.4  . Call MD for:  persistant nausea and vomiting  . Call MD for:  severe uncontrolled pain  . Call MD for:  redness, tenderness, or signs of infection (pain, swelling, redness, odor or green/yellow discharge around incision site)  . Call MD for:  difficulty breathing, headache or visual disturbances  . Call MD for:  hives  . Call MD for:  persistant dizziness or light-headedness  . Call MD for:  extreme fatigue  . (HEART FAILURE PATIENTS) Call MD:  Anytime you have  any of the following symptoms: 1) 3 pound weight gain in 24 hours or 5 pounds in 1 week 2) shortness of breath, with or without a dry hacking cough 3) swelling in the hands, feet or stomach 4) if you have to sleep on extra pillows at night in order to breathe.  Maryann Alar Test  . Discharge patient Discharge disposition: 01-Home or Self Care; Discharge patient date: 04/10/2018   Meds ordered this encounter  Medications  . alum & mag hydroxide-simeth (MAALOX/MYLANTA) 200-200-20 MG/5ML suspension 30 mL  . lidocaine (XYLOCAINE) 2 % viscous mouth solution 15 mL  . hyoscyamine (ANASPAZ) disintergrating tablet 0.125 mg   MDM: PE, chart reviewed, wet prep neg, fern test neg, gi cocktail resolved epigastric pain, BP 125/75. CMP wnl. Consulted with MD Pinn.   Assessment: 1. Braxton Hick's contraction   2. Vaginal discharge   3. Gastroesophageal reflux disease without esophagitis   4. Insulin controlled gestational diabetes mellitus (GDM) in third trimester   5. Pre-eclampsia in third trimester   Pt having [place], gerd relieved by GI cocktail, BP was slight elevated but decreased and is stable at 125/75, CMP WNL, denies s/sx of preeclampsia. Normal vaginal discharge, neg wet prep just positive for wbc. GDMA2 controlled with insulin and monitoring, som elevated BS on log/ no s/sx. Pt already BMZ complete. Neg fern test.   Plan: Discharge home in stable condition.  Labor precautions and fetal kick counts GERD: Cont to take '40mg'$  protonix nightly Preeclampsia: continue to monitor bp at home, report vision changes headaches and right upper gastric pain. Continue to take procardia '30mg'$  XL.  GDMA2: continue to monitor BS and bring log to PCP appointent.  Vaginal discharge: Keep clean and dry, air out at nights.  Follow-up [facility] Follow up.   Specialty:  Obstetrics and Gynecology Why:  please attend your appointment already schedule on 12/3 Contact  information: [address] [place] [place] [address] [phone]          Allergies as of 04/10/2018      Reactions   Latex Itching   Morphine And Related Itching   Lower doses OK   Penicillins Rash, Other (See Comments)   Childhood reaction ( unknown) Has patient had a PCN reaction causing immediate rash, facial/tongue/throat swelling, SOB or lightheadedness with hypotension: Yes Has patient had a PCN reaction causing severe rash involving mucus membranes or skin necrosis: No Has patient had a PCN reaction that required hospitalization: No Has patient had a PCN reaction occurring within the last 10 years:  No If all of the above answers are "NO", then may proceed with Cephalosporin use.      Medication List    TAKE these medications   acetaminophen 325 MG tablet Commonly known as:  TYLENOL Take 2 tablets (650 mg total) by mouth every 6 (six) hours as needed for moderate pain (Take 1-2 tablets every six hours as needed for pain).   albuterol 108 (90 Base) MCG/ACT inhaler Commonly known as:  PROVENTIL HFA;VENTOLIN HFA Inhale 1-2 puffs into the lungs every 6 (six) hours as needed for wheezing or shortness of breath.   Alcohol Swabs Pads 1 application by Does not apply route 4 (four) times daily.   blood glucose meter kit and supplies Kit Dispense based on patient and insurance preference. Use up to four times daily as directed. (FOR ICD-9 250.00, 250.01).   butalbital-acetaminophen-caffeine 50-325-40 MG tablet Commonly known as:  FIORICET, ESGIC Take 1 tablet by mouth every 6 (six) hours as needed for headache.   docusate sodium 100 MG capsule Commonly known as:  COLACE Take 1 capsule (100 mg total) by mouth daily as needed for mild constipation.   insulin aspart 100 UNIT/ML injection Commonly known as:  novoLOG Inject 16 Units into the skin daily before supper.   insulin lispro 100 UNIT/ML injection Commonly known as:  HUMALOG Inject  0.16 mLs (16 Units total) into the skin daily with supper.   insulin NPH Human 100 UNIT/ML injection Commonly known as:  HUMULIN N,NOVOLIN N Inject 0.18 mLs (18 Units total) into the skin at bedtime.   insulin NPH Human 100 UNIT/ML injection Commonly known as:  HUMULIN N,NOVOLIN N Inject 0.1 mLs (10 Units total) into the skin daily before breakfast.   Insulin Syringe-Needle U-100 30G X 1/2" 1 ML Misc 1 Syringe by Does not apply route 4 (four) times daily.   Magnesium 500 MG Tabs Take by mouth.   NIFEdipine 30 MG 24 hr tablet Commonly known as:  PROCARDIA-XL/NIFEDICAL-XL Take 30 mg by mouth at bedtime.   onetouch ultrasoft lancets Use as instructed   prenatal multivitamin Tabs tablet Take 1 tablet by mouth daily at 12 noon.       [facility] NP-C, [place], [place], [place] 04/10/2018 7:41 PM

## 2018-04-11 ENCOUNTER — Telehealth (HOSPITAL_COMMUNITY): Payer: Self-pay | Admitting: *Deleted

## 2018-04-11 ENCOUNTER — Ambulatory Visit (HOSPITAL_COMMUNITY): Admission: RE | Admit: 2018-04-11 | Payer: 59 | Source: Ambulatory Visit

## 2018-04-11 ENCOUNTER — Encounter (HOSPITAL_COMMUNITY): Payer: Self-pay | Admitting: *Deleted

## 2018-04-11 DIAGNOSIS — M5489 Other dorsalgia: Secondary | ICD-10-CM | POA: Diagnosis not present

## 2018-04-11 LAB — OB RESULTS CONSOLE GBS: GBS: POSITIVE

## 2018-04-12 DIAGNOSIS — O139 Gestational [pregnancy-induced] hypertension without significant proteinuria, unspecified trimester: Secondary | ICD-10-CM | POA: Diagnosis not present

## 2018-04-12 DIAGNOSIS — Z3A33 33 weeks gestation of pregnancy: Secondary | ICD-10-CM | POA: Diagnosis not present

## 2018-04-12 DIAGNOSIS — O1493 Unspecified pre-eclampsia, third trimester: Secondary | ICD-10-CM | POA: Diagnosis not present

## 2018-04-14 DIAGNOSIS — Z630 Problems in relationship with spouse or partner: Secondary | ICD-10-CM | POA: Diagnosis not present

## 2018-04-15 DIAGNOSIS — O1493 Unspecified pre-eclampsia, third trimester: Secondary | ICD-10-CM | POA: Diagnosis not present

## 2018-04-18 ENCOUNTER — Telehealth (HOSPITAL_COMMUNITY): Payer: Self-pay | Admitting: *Deleted

## 2018-04-18 DIAGNOSIS — Z630 Problems in relationship with spouse or partner: Secondary | ICD-10-CM | POA: Diagnosis not present

## 2018-04-18 NOTE — Telephone Encounter (Signed)
Preadmission screen  

## 2018-04-19 DIAGNOSIS — O1493 Unspecified pre-eclampsia, third trimester: Secondary | ICD-10-CM | POA: Diagnosis not present

## 2018-04-19 DIAGNOSIS — Z3A34 34 weeks gestation of pregnancy: Secondary | ICD-10-CM | POA: Diagnosis not present

## 2018-04-21 ENCOUNTER — Telehealth (HOSPITAL_COMMUNITY): Payer: Self-pay | Admitting: *Deleted

## 2018-04-21 ENCOUNTER — Encounter (HOSPITAL_COMMUNITY): Payer: Self-pay | Admitting: *Deleted

## 2018-04-21 NOTE — Telephone Encounter (Signed)
Preadmission screen  

## 2018-04-22 ENCOUNTER — Encounter (HOSPITAL_COMMUNITY)
Admission: RE | Admit: 2018-04-22 | Discharge: 2018-04-22 | Disposition: A | Discharge status: Home / Self Care | Payer: 59 | Source: Ambulatory Visit | Attending: Obstetrics & Gynecology | Admitting: Obstetrics & Gynecology

## 2018-04-22 DIAGNOSIS — O1493 Unspecified pre-eclampsia, third trimester: Secondary | ICD-10-CM | POA: Diagnosis not present

## 2018-04-22 NOTE — Pre-Procedure Instructions (Signed)
ARRIVED FOR ANESTHESIA CONSULT ON 12/13.  Dr Miguel Rotadonno in to see pt.  Pt accessed her records via internet with images for Dr Miguel Rotadonno.  Consult occurred in Short Stay room 1

## 2018-04-22 NOTE — Consult Note (Signed)
Called to see patient in pre-op assessment area for upcoming possible Vaginal delivery with epidural for pain control vs spinal for C/s.      [name]  Has undergone extensive back surgery X 3.   I reviewed her most recent MRI with her and it shows Posterior laminectomies and metallic fixation between L2 and L5 with previous removal of hardware between L4 and L5. And new placement of L4/L5 disc cage and L5/S1 disc cage. She has also had extensive laminectomies and hemi-laminectomies from L2 to L5.  The patient is very educated on her procedures !  I had a long discussion with the [name].  I explained why she would not be a good candidate for labor epidural ( failure and Headache) but could potentially have a L1/L2 spinal if the anesthesiologist felt so inclined.    Her airway was Mallampati I so I believe she would do fine with a GAET for C/S.  I will contact her OBGYN to discuss.  ej Kammi Hechler jr md [phone]

## 2018-04-26 ENCOUNTER — Encounter (HOSPITAL_COMMUNITY): Payer: Self-pay | Admitting: *Deleted

## 2018-04-26 ENCOUNTER — Telehealth (HOSPITAL_COMMUNITY): Payer: Self-pay | Admitting: *Deleted

## 2018-04-26 DIAGNOSIS — O133 Gestational [pregnancy-induced] hypertension without significant proteinuria, third trimester: Secondary | ICD-10-CM | POA: Diagnosis not present

## 2018-04-26 DIAGNOSIS — O1493 Unspecified pre-eclampsia, third trimester: Secondary | ICD-10-CM | POA: Diagnosis not present

## 2018-04-26 DIAGNOSIS — Z3A35 35 weeks gestation of pregnancy: Secondary | ICD-10-CM | POA: Diagnosis not present

## 2018-04-26 NOTE — Telephone Encounter (Signed)
Preadmission screen  

## 2018-04-27 DIAGNOSIS — G894 Chronic pain syndrome: Secondary | ICD-10-CM | POA: Diagnosis not present

## 2018-04-29 DIAGNOSIS — G894 Chronic pain syndrome: Secondary | ICD-10-CM | POA: Diagnosis not present

## 2018-04-29 DIAGNOSIS — O1493 Unspecified pre-eclampsia, third trimester: Secondary | ICD-10-CM | POA: Diagnosis not present

## 2018-05-02 ENCOUNTER — Inpatient Hospital Stay (HOSPITAL_COMMUNITY)
Admission: RE | Admit: 2018-05-02 | Discharge: 2018-05-09 | DRG: 787 | Disposition: A | Discharge status: Home / Self Care | Payer: 59 | Attending: Obstetrics and Gynecology | Admitting: Obstetrics and Gynecology

## 2018-05-02 ENCOUNTER — Encounter (HOSPITAL_COMMUNITY): Payer: Self-pay

## 2018-05-02 ENCOUNTER — Inpatient Hospital Stay (HOSPITAL_BASED_OUTPATIENT_CLINIC_OR_DEPARTMENT_OTHER): Payer: 59

## 2018-05-02 ENCOUNTER — Other Ambulatory Visit: Payer: Self-pay

## 2018-05-02 DIAGNOSIS — O24419 Gestational diabetes mellitus in pregnancy, unspecified control: Secondary | ICD-10-CM | POA: Diagnosis present

## 2018-05-02 DIAGNOSIS — R51 Headache: Secondary | ICD-10-CM | POA: Diagnosis not present

## 2018-05-02 DIAGNOSIS — O9832 Other infections with a predominantly sexual mode of transmission complicating childbirth: Secondary | ICD-10-CM | POA: Diagnosis present

## 2018-05-02 DIAGNOSIS — O24414 Gestational diabetes mellitus in pregnancy, insulin controlled: Secondary | ICD-10-CM

## 2018-05-02 DIAGNOSIS — O1493 Unspecified pre-eclampsia, third trimester: Secondary | ICD-10-CM | POA: Diagnosis not present

## 2018-05-02 DIAGNOSIS — O1414 Severe pre-eclampsia complicating childbirth: Principal | ICD-10-CM | POA: Diagnosis present

## 2018-05-02 DIAGNOSIS — O99213 Obesity complicating pregnancy, third trimester: Secondary | ICD-10-CM | POA: Diagnosis not present

## 2018-05-02 DIAGNOSIS — O368390 Maternal care for abnormalities of the fetal heart rate or rhythm, unspecified trimester, not applicable or unspecified: Secondary | ICD-10-CM

## 2018-05-02 DIAGNOSIS — A6000 Herpesviral infection of urogenital system, unspecified: Secondary | ICD-10-CM | POA: Diagnosis present

## 2018-05-02 DIAGNOSIS — Z87891 Personal history of nicotine dependence: Secondary | ICD-10-CM

## 2018-05-02 DIAGNOSIS — O24424 Gestational diabetes mellitus in childbirth, insulin controlled: Secondary | ICD-10-CM | POA: Diagnosis present

## 2018-05-02 DIAGNOSIS — Z3A35 35 weeks gestation of pregnancy: Secondary | ICD-10-CM

## 2018-05-02 DIAGNOSIS — O99214 Obesity complicating childbirth: Secondary | ICD-10-CM | POA: Diagnosis present

## 2018-05-02 DIAGNOSIS — O368330 Maternal care for abnormalities of the fetal heart rate or rhythm, third trimester, not applicable or unspecified: Secondary | ICD-10-CM | POA: Diagnosis not present

## 2018-05-02 DIAGNOSIS — O133 Gestational [pregnancy-induced] hypertension without significant proteinuria, third trimester: Secondary | ICD-10-CM

## 2018-05-02 DIAGNOSIS — O1490 Unspecified pre-eclampsia, unspecified trimester: Secondary | ICD-10-CM

## 2018-05-02 DIAGNOSIS — G894 Chronic pain syndrome: Secondary | ICD-10-CM | POA: Diagnosis not present

## 2018-05-02 DIAGNOSIS — O99824 Streptococcus B carrier state complicating childbirth: Secondary | ICD-10-CM | POA: Diagnosis present

## 2018-05-02 DIAGNOSIS — O139 Gestational [pregnancy-induced] hypertension without significant proteinuria, unspecified trimester: Secondary | ICD-10-CM | POA: Diagnosis not present

## 2018-05-02 DIAGNOSIS — O1410 Severe pre-eclampsia, unspecified trimester: Secondary | ICD-10-CM

## 2018-05-02 DIAGNOSIS — Z3A36 36 weeks gestation of pregnancy: Secondary | ICD-10-CM | POA: Diagnosis not present

## 2018-05-02 DIAGNOSIS — O1403 Mild to moderate pre-eclampsia, third trimester: Secondary | ICD-10-CM | POA: Diagnosis not present

## 2018-05-02 DIAGNOSIS — O43813 Placental infarction, third trimester: Secondary | ICD-10-CM | POA: Diagnosis not present

## 2018-05-02 LAB — URINALYSIS, ROUTINE W REFLEX MICROSCOPIC
Bilirubin Urine: NEGATIVE
Glucose, UA: NEGATIVE mg/dL
Hgb urine dipstick: NEGATIVE
Ketones, ur: NEGATIVE mg/dL
Leukocytes, UA: NEGATIVE
Nitrite: NEGATIVE
Protein, ur: NEGATIVE mg/dL
SPECIFIC GRAVITY, URINE: 1.005 (ref 1.005–1.030)
pH: 7 (ref 5.0–8.0)

## 2018-05-02 LAB — COMPREHENSIVE METABOLIC PANEL
ALT: 15 U/L (ref 0–44)
ANION GAP: 6 (ref 5–15)
AST: 19 U/L (ref 15–41)
Albumin: 2.8 g/dL — ABNORMAL LOW (ref 3.5–5.0)
Alkaline Phosphatase: 178 U/L — ABNORMAL HIGH (ref 38–126)
BUN: 7 mg/dL (ref 6–20)
CO2: 22 mmol/L (ref 22–32)
Calcium: 8.9 mg/dL (ref 8.9–10.3)
Chloride: 105 mmol/L (ref 98–111)
Creatinine, Ser: 0.68 mg/dL (ref 0.44–1.00)
GFR calc Af Amer: >60 mL/min (ref >60)
GFR calc non Af Amer: >60 mL/min (ref >60)
GLUCOSE: 99 mg/dL (ref 70–99)
Potassium: 3.6 mmol/L (ref 3.5–5.1)
Sodium: 133 mmol/L — ABNORMAL LOW (ref 135–145)
Total Bilirubin: 0.3 mg/dL (ref 0.3–1.2)
Total Protein: 6.3 g/dL — ABNORMAL LOW (ref 6.5–8.1)

## 2018-05-02 LAB — PROTEIN / CREATININE RATIO, URINE
Creatinine, Urine: 35 mg/dL
PROTEIN CREATININE RATIO: 0.31 mg/mg{creat} — AB (ref 0.00–0.15)
Total Protein, Urine: 11 mg/dL

## 2018-05-02 LAB — TYPE AND SCREEN
ABO/RH(D): A POS
Antibody Screen: NEGATIVE

## 2018-05-02 LAB — CBC
HEMATOCRIT: 34.4 % — AB (ref 36.0–46.0)
Hemoglobin: 11.3 g/dL — ABNORMAL LOW (ref 12.0–15.0)
MCH: 28.5 pg (ref 26.0–34.0)
MCHC: 32.8 g/dL (ref 30.0–36.0)
MCV: 86.6 fL (ref 80.0–100.0)
Platelets: 313 10*3/uL (ref 150–400)
RBC: 3.97 MIL/uL (ref 3.87–5.11)
RDW: 13.4 % (ref 11.5–15.5)
WBC: 7.9 10*3/uL (ref 4.0–10.5)
nRBC: 0 % (ref 0.0–0.2)

## 2018-05-02 MED ORDER — LABETALOL HCL 5 MG/ML IV SOLN: 80.0000 mg | INTRAVENOUS | Status: DC | PRN

## 2018-05-02 MED ORDER — ACETAMINOPHEN 325 MG PO TABS
650.0000 mg | ORAL_TABLET | Freq: Four times a day (QID) | ORAL | Status: DC | PRN
  Administered 2018-05-02: 650 mg via ORAL
  Filled 2018-05-02: qty 2

## 2018-05-02 MED ORDER — PRENATAL MULTIVITAMIN CH
1.0000 | ORAL_TABLET | Freq: Every day | ORAL | Status: DC
  Administered 2018-05-03: 1 via ORAL
  Filled 2018-05-02: qty 1

## 2018-05-02 MED ORDER — ZOLPIDEM TARTRATE 5 MG PO TABS
5.0000 mg | ORAL_TABLET | Freq: Every evening | ORAL | Status: DC | PRN
  Administered 2018-05-03 (×2): 5 mg via ORAL
  Filled 2018-05-02 (×2): qty 1

## 2018-05-02 MED ORDER — CALCIUM CARBONATE ANTACID 500 MG PO CHEW: 2.0000 | CHEWABLE_TABLET | ORAL | Status: DC | PRN

## 2018-05-02 MED ORDER — BUTALBITAL-APAP-CAFFEINE 50-325-40 MG PO TABS
1.0000 | ORAL_TABLET | Freq: Three times a day (TID) | ORAL | Status: DC | PRN
  Administered 2018-05-03: 1 via ORAL
  Filled 2018-05-02: qty 1

## 2018-05-02 MED ORDER — LABETALOL HCL 5 MG/ML IV SOLN
20.0000 mg | INTRAVENOUS | Status: DC | PRN
  Administered 2018-05-05: 20 mg via INTRAVENOUS
  Filled 2018-05-02: qty 4

## 2018-05-02 MED ORDER — INSULIN NPH (HUMAN) (ISOPHANE) 100 UNIT/ML ~~LOC~~ SUSP
24.0000 [IU] | Freq: Every day | SUBCUTANEOUS | Status: DC
  Administered 2018-05-03 – 2018-05-04 (×3): 24 [IU] via SUBCUTANEOUS
  Filled 2018-05-02 (×2): qty 10

## 2018-05-02 MED ORDER — ACETAMINOPHEN 325 MG PO TABS: 650.0000 mg | ORAL_TABLET | ORAL | Status: DC | PRN

## 2018-05-02 MED ORDER — HYDRALAZINE HCL 20 MG/ML IJ SOLN: 10.0000 mg | INTRAMUSCULAR | Status: DC | PRN

## 2018-05-02 MED ORDER — DOCUSATE SODIUM 100 MG PO CAPS
100.0000 mg | ORAL_CAPSULE | Freq: Every day | ORAL | Status: DC
  Administered 2018-05-03 – 2018-05-04 (×2): 100 mg via ORAL
  Filled 2018-05-02 (×2): qty 1

## 2018-05-02 MED ORDER — NIFEDIPINE ER 30 MG PO TB24
30.0000 mg | ORAL_TABLET | Freq: Every day | ORAL | Status: DC
  Administered 2018-05-03 – 2018-05-04 (×2): 30 mg via ORAL
  Filled 2018-05-02 (×3): qty 1

## 2018-05-02 MED ORDER — INSULIN ASPART 100 UNIT/ML ~~LOC~~ SOLN
0.0000 [IU] | Freq: Four times a day (QID) | SUBCUTANEOUS | Status: DC
  Administered 2018-05-03: 2 [IU] via SUBCUTANEOUS

## 2018-05-02 MED ORDER — INSULIN NPH (HUMAN) (ISOPHANE) 100 UNIT/ML ~~LOC~~ SUSP
16.0000 [IU] | Freq: Every day | SUBCUTANEOUS | Status: DC
  Administered 2018-05-03 – 2018-05-05 (×3): 16 [IU] via SUBCUTANEOUS

## 2018-05-02 MED ORDER — LABETALOL HCL 5 MG/ML IV SOLN
40.0000 mg | INTRAVENOUS | Status: DC | PRN
  Filled 2018-05-02: qty 8

## 2018-05-02 MED ORDER — VALACYCLOVIR HCL 500 MG PO TABS
500.0000 mg | ORAL_TABLET | Freq: Every day | ORAL | Status: DC
  Administered 2018-05-03 – 2018-05-06 (×4): 500 mg via ORAL
  Filled 2018-05-02 (×5): qty 1

## 2018-05-02 NOTE — MAU Note (Signed)
Pt reports BP 164/70 tonight. Pt reports HA that started 1 hour ago. States she has not taken anything. Denies vision changes or RUQ pain. States she did take her Procardia today. Pt reports intermittent contractions. Pt reports good fetal movement. Pt denies LOF or vaginal bleeding.

## 2018-05-03 ENCOUNTER — Inpatient Hospital Stay (HOSPITAL_COMMUNITY): Payer: 59

## 2018-05-03 ENCOUNTER — Inpatient Hospital Stay (HOSPITAL_BASED_OUTPATIENT_CLINIC_OR_DEPARTMENT_OTHER): Payer: 59

## 2018-05-03 DIAGNOSIS — O24414 Gestational diabetes mellitus in pregnancy, insulin controlled: Secondary | ICD-10-CM

## 2018-05-03 DIAGNOSIS — O1410 Severe pre-eclampsia, unspecified trimester: Secondary | ICD-10-CM | POA: Diagnosis present

## 2018-05-03 DIAGNOSIS — O139 Gestational [pregnancy-induced] hypertension without significant proteinuria, unspecified trimester: Secondary | ICD-10-CM

## 2018-05-03 DIAGNOSIS — O1403 Mild to moderate pre-eclampsia, third trimester: Secondary | ICD-10-CM

## 2018-05-03 DIAGNOSIS — Z3A35 35 weeks gestation of pregnancy: Secondary | ICD-10-CM

## 2018-05-03 DIAGNOSIS — O99213 Obesity complicating pregnancy, third trimester: Secondary | ICD-10-CM

## 2018-05-03 DIAGNOSIS — O1493 Unspecified pre-eclampsia, third trimester: Secondary | ICD-10-CM

## 2018-05-03 DIAGNOSIS — O24419 Gestational diabetes mellitus in pregnancy, unspecified control: Secondary | ICD-10-CM | POA: Diagnosis present

## 2018-05-03 LAB — GLUCOSE, CAPILLARY
Glucose-Capillary: 109 mg/dL — ABNORMAL HIGH (ref 70–99)
Glucose-Capillary: 117 mg/dL — ABNORMAL HIGH (ref 70–99)
Glucose-Capillary: 128 mg/dL — ABNORMAL HIGH (ref 70–99)
Glucose-Capillary: 144 mg/dL — ABNORMAL HIGH (ref 70–99)
Glucose-Capillary: 84 mg/dL (ref 70–99)

## 2018-05-03 NOTE — Consult Note (Signed)
Maternal-Fetal Medicine  [name]. MRN: [id number] Requesting Provider: Kathalene FramesEllis Greer, CNM  [name], G1 P0 at 35w 6d gestation, was admitted yesterday following increased blood pressures at home (164/70 and repeat 155 SBP). She had frontal headaches, but no visual disturbances or right upper quadrant pain. No history of vaginal bleeding. She reports good fetal movements. Patient reports intermittent nausea, but no vomiting. No chest pain or palpitations. Patient takes nifedipine XL 30 mg daily.  She has gestational diabetes that is well-controlled on insulin. She takes insulin NPH 16 units in the morning and 24 units at night and Humalog sliding scale with meals.   She had consultations with MFM (October 2019) when she was admitted with the diagnosis of gestational hypertension.  Medications: Nifedipine, insulin, valacyclovir prophylaxis.  P/E: Patient is comfortably lying in bed; not in distress. BP 135-155/80-100 mm Hg. HEENT: Normal Abd: Soft gravid uterus; no tenderness. Minimal pedal edema. NST reactive.  On ultrasound, amniotic fluid is normal and good fetal activity is seen. BPP 8/10 (fetal breathing movements did not meet the criteria).  Labs: ALT 15, AST 19, creatinine 0.68, protein/creatinine ratio 0.31, fasting BG 84 mg/dL, Hb 14.711.3, Hct 82.934.4, WBC 7.9, PLT 313.  Our concerns include: Preeclampsia without severe features: Since admission, none of her blood pressures is in the severe range. Patient has mild intermittent headaches, but otherwise feel well. We discussed the diagnosis of preeclampsia and its possible complications. Diabetes complicates her pregnancy and adverse outcomes are likely to increase with co-morbidity. I recommend continued inpatient management till delivery. We discussed timing of delivery. If severe features do not occur, patient should be delivered at 37 weeks.  Recommendations: -Continue inpatient management till delivery at 37  weeks. -Continue nifedipine and insulin. -Daily NST. -Twice-weekly BPP. -Fetal growth assessment with next BPP. -BP monitoring as per protocol. -Twice-weekly labs.  Delivery is indicated (not limited to) for following reasons:  -Persistent and uncontrolled hypertension not responding to antihypertensives  -Abnormal labs including increasing liver enzymes or thrombocytopenia (<100,000).  -Non-reassuring fetal heart trace.  -Oligohydramnios or severe fetal growth restriction or abnormal antenatal testings.  -Oliguria (hourly output 20 mL to 30 mL).  -Increasing creatinine (?1.1) Severe symptoms of headache or visual spots or persistent right upper quadrant pain.  -Placental abruption.   Thank you for your consult. Please do not hesitate to contact me if you have any questions or concerns.  Consultation including face-to-face counseling: 30 min.

## 2018-05-03 NOTE — Progress Notes (Addendum)
Antepartum progress note.   Ms. Eliot FordBriggs, G1 P0 at 35w 6d gestation, was admitted yesterday following increased blood pressures at home (164/70 and repeat 155 SBP). She had frontal headaches, but no visual disturbances or right upper quadrant pain. No history of vaginal bleeding. She reports good fetal movements. Patient reports intermittent nausea, but no vomiting. No chest pain or palpitations. Patient takes nifedipine XL 30 mg daily.  She has gestational diabetes that is well-controlled on insulin. She takes insulin NPH 16 units in the morning and 24 units at night and Humalog sliding scale with meals.   She had consultations with MFM (October 2019) when she was admitted with the diagnosis of gestational hypertension.  Medications: Nifedipine, insulin, valacyclovir prophylaxis.  Subjective: Pt in bed with partner at bedside and in stable conditions, pt denies HA, no vision changes, no RUQ pain. Pt denies leakage of fluid or vaginal bleeding, pt endorses +FM. Pt understands plan on continue to monitor while getting consult with MFM for HA that she still has and complains of 7/10 on the pain scale. Pt denies taking or being given anything for it today, pt stated nothing ever helps her HA, I strongly encourage to pt to try Fioricet and pt verbalized understanding and consented to being given meds.  Patient Active Problem List   Diagnosis Date Noted  . Severe preeclampsia 05/03/2018  . Gestational diabetes 05/03/2018  . Gestational hypertension 03/05/2018  . Preeclampsia, severe, second trimester 03/04/2018  . Mild pre-eclampsia affecting third pregnancy 02/25/2018  . Morbid obesity (HCC) 02/25/2018   Objective: BP 135/88 (BP Location: Right Arm)   Pulse 98   Temp 97.9 F (36.6 C) (Oral)   Resp 18   Ht 5\' 9"  (1.753 m)   Wt 112.5 kg   LMP 08/25/2017   SpO2 92%   BMI 36.63 kg/m  I/O last 3 completed shifts: In: -  Out: 150 [Urine:150] Total I/O In: -  Out: 200 [Urine:200]   P/E:  Patient is comfortably lying in bed; not in distress. BP 135-155/80-100 mm Hg. HEENT: Normal Lungs: CTA Bi-lat.  CV: RRR, no murmurs Abd: Soft gravid uterus; no tenderness. Minimal pedal edema. 2+ DTR with patellar reflexes, no clonus.   NST: FHR baseline 120s bpm, Variability: moderate, Accelerations:present, Decelerations:  Absent= Cat 1/Reactive CTX:  none, uterine irritability  Uterus gravid, soft non tender, moderate to palpate with contractions.   Deferred vaginal exam this morning last exam see below, which was 12/23. SVE:  Dilation: Closed Effacement (%): Thick Exam by:: Kathalene FramesEllis Greer, CNM   Intake/Output Summary (Last 24 hours) at 05/03/2018 1205 Last data filed at 05/03/2018 0747 Gross per 24 hour  Intake -  Output 350 ml  Net -350 ml   CBG (last 3)  Recent Labs    05/03/18 0017 05/03/18 0811 05/03/18 1003  GLUCAP 117* 84 109*    Assessment:  Francoise SchaumannYolanda M Joaquin, [age] y.o., [phone], with an IUP @ 2213w6d, presented for preeclampsia with SF. Reactive NST, Cat 1 fetal strip. consulted with MFM, On procardia 30 xl daily with BP of 135/88, pcr was 0.31, with HA now 7/10. GDMA2 stable on insulin with HS of glucose being 109. Pt stable. BMZ 10/24 & 25 Patient Active Problem List   Diagnosis Date Noted  . Severe preeclampsia 05/03/2018  . Gestational diabetes 05/03/2018  . Gestational hypertension 03/05/2018  . Preeclampsia, severe, second trimester 03/04/2018  . Mild pre-eclampsia affecting third pregnancy 02/25/2018  . Morbid obesity (HCC) 02/25/2018   Plan: Continuous monitoring. HA:  Give Fioricet now and recheck pain in one hour Preeclampsia with SF: IOL at 37 weeks.  GDMA2: Continue FBS and 2H PP with sliding scale and NPH to be given 16 units in morning and 24units at night.   Fetal Wellbeing: Pending BPP and growth scan today May benifit from repeat BMZ  PER MFM See Below Recommendation: Preeclampsia without severe features: Since admission, none of her blood  pressures is in the severe range. Patient has mild intermittent headaches, but otherwise feel well. We discussed the diagnosis of preeclampsia and its possible complications. Diabetes complicates her pregnancy and adverse outcomes are likely to increase with co-morbidity. I recommend continued inpatient management till delivery. We discussed timing of delivery. If severe features do not occur, patient should be delivered at 37 weeks.  Recommendations: -Continue inpatient management till delivery at 37 weeks. -Continue nifedipine and insulin. -Daily NST. -Twice-weekly BPP. -Fetal growth assessment with next BPP. -BP monitoring as per protocol. -Twice-weekly labs.  Delivery is indicated (not limited to) for following reasons:  -Persistent and uncontrolled hypertension not responding to antihypertensives  -Abnormal labs including increasing liver enzymes or thrombocytopenia (<100,000).  -Non-reassuring fetal heart trace.  -Oligohydramnios or severe fetal growth restriction or abnormal antenatal testings.  -Oliguria (hourly output 20 mL to 30 mL).  -Increasing creatinine (?1.1) Severe symptoms of headache or visual spots or persistent right upper quadrant pain.  -Placental abruption.  [place] to be updated   [facility] HospitalJade [facility], NP-C, CNM, MSN 05/03/2018. 12:05 PM, late entry   Pt without complaint.  States her headache is the same but she was able to sleep after fioriciet    Will give ambien for sleep and DC continuous monitoring.  Will reassess headache in the morning  [facility]

## 2018-05-03 NOTE — H&P (Signed)
[name] is a 32 y.o. female presenting for known preeclampsia without severe features, severe range BP recorded at home and headache.   OB History    Gravida  3   Para  0   Term  0   Preterm  0   AB  2   Living  0     SAB  0   TAB  2   Ectopic  0   Multiple  0   Live Births             Past Medical History:  Diagnosis Date  . Asthma   . Complication of anesthesia    trouble waking up  . Diabetes mellitus without complication (HCC)   . Gestational diabetes    insulin  . HSV (herpes simplex virus) anogenital infection   . Hypertension   . No pertinent past medical history   . Ovarian cyst   . Pregnancy induced hypertension    procardia   Past Surgical History:  Procedure Laterality Date  . BACK SURGERY    . DILATION AND CURETTAGE OF UTERUS    . INDUCED ABORTION     x2  . laporocscopy     Family History: family history includes Hypertension in her father and mother. Social History:  reports that she quit smoking about 2 years ago. She smoked 0.50 packs per day. She has never used smokeless tobacco. She reports that she does not drink alcohol or use drugs.     Maternal Diabetes: Yes:  Diabetes Type:  Insulin/Medication controlled. Takes 16u of Humulin in am and 24u in pm, has sliding scale Novolog as well be states she has not had to use it  Genetic Screening: Normal Maternal Ultrasounds/Referrals: Normal Fetal Ultrasounds or other Referrals:  None Maternal Substance Abuse:  No Significant Maternal Medications:  None Significant Maternal Lab Results:  Lab values include: Group B Strep positive, Other: Protein creatinine ratio 0.31 Other Comments:  Patient diagnosed with preeclampsia at 27 weeks. Patient with weekly antenatal testing at 32 weeks and q4 weekly growth ultrasounds. Per MFM, delivery at 37 weeks unless preclampsia becomes severe enough to warrant earlier delivery. Patient is BMZ complete.  Review of Systems  Eyes: Negative for  blurred vision and double vision.  Gastrointestinal: Negative for abdominal pain.  Neurological: Positive for headaches.  All other systems reviewed and are negative.  Maternal Medical History:  Fetal activity: Perceived fetal activity is normal.   Last perceived fetal movement was within the past hour.    Prenatal complications: Pre-eclampsia.   Prenatal Complications - Diabetes: gestational. Diabetes is managed by insulin injections.      Dilation: Closed Effacement (%): Thick Exam by:: [name], CNM   Vitals:   05/02/18 2202 05/02/18 2231 05/02/18 2300 05/02/18 2333  BP: (!) 139/97 (!) 139/100  (!) 146/82  Pulse: 82 95  87  Resp:    20  Temp:    98.4 F (36.9 C)  TempSrc:    Oral  SpO2:    100%  Weight:   112.5 kg   Height:   5\' 9"  (1.753 m)      Maternal Exam:  Abdomen: Patient reports no abdominal tenderness. Fundal height is Size=dates .   Estimated fetal weight is 6.5lbs .    Introitus: Normal vulva. Normal vagina.  Pelvis: adequate for delivery.   Cervix: Cervix evaluated by digital exam.     Fetal Exam Fetal Monitor Review: Mode: ultrasound.   Baseline rate: 120.  Variability: moderate (6-25 bpm).   Pattern: accelerations present and variable decelerations.    Fetal State Assessment: Category II - tracings are indeterminate.     Physical Exam  Nursing note and vitals reviewed. Constitutional: She is oriented to person, place, and time. She appears well-developed and well-nourished.  HENT:  Head: Normocephalic and atraumatic.  Eyes: Pupils are equal, round, and reactive to light.  Cardiovascular: Normal rate, regular rhythm and normal heart sounds.  Respiratory: Effort normal and breath sounds normal.  GI: There is no abdominal tenderness.  Genitourinary:    Vulva, vagina and uterus normal.   Musculoskeletal: Normal range of motion.  Neurological: She is alert and oriented to person, place, and time.  Skin: Skin is warm and dry.   Psychiatric: She has a normal mood and affect. Her behavior is normal. Judgment and thought content normal.    Prenatal labs: ABO, Rh: --/--/A POS (12/23 2240) Antibody: NEG (12/23 2240) Rubella:  Immune RPR:   NR HBsAg:   NR HIV:   NR GBS: Positive (12/02 0000)   Assessment/Plan: 32 y.o.  G3P0 at 4373w6d Preeclampsia  Reactive NST, category II FHTs for occasional but recurrent mild variables Consulted Dr. Charlotta Newtonzan:   Admit to antepartum for observation  BPP- completed results pending  Gestational diabetes A2- restart home medication HSV on valtrex Procardia 30mg  QD for elevated pressures- restart home medication  [name] 05/03/2018, 1:20 AM

## 2018-05-04 ENCOUNTER — Encounter (HOSPITAL_COMMUNITY): Payer: Self-pay

## 2018-05-04 LAB — GLUCOSE, CAPILLARY
GLUCOSE-CAPILLARY: 95 mg/dL (ref 70–99)
Glucose-Capillary: 72 mg/dL (ref 70–99)
Glucose-Capillary: 126 mg/dL — ABNORMAL HIGH (ref 70–99)
Glucose-Capillary: 64 mg/dL — ABNORMAL LOW (ref 70–99)

## 2018-05-04 LAB — CBC
HCT: 33.5 % — ABNORMAL LOW (ref 36.0–46.0)
Hemoglobin: 10.8 g/dL — ABNORMAL LOW (ref 12.0–15.0)
MCH: 28.3 pg (ref 26.0–34.0)
MCHC: 32.2 g/dL (ref 30.0–36.0)
MCV: 87.9 fL (ref 80.0–100.0)
NRBC: 0 % (ref 0.0–0.2)
Platelets: 303 10*3/uL (ref 150–400)
RBC: 3.81 MIL/uL — ABNORMAL LOW (ref 3.87–5.11)
RDW: 13.8 % (ref 11.5–15.5)
WBC: 7.7 10*3/uL (ref 4.0–10.5)

## 2018-05-04 LAB — COMPREHENSIVE METABOLIC PANEL
ALBUMIN: 3 g/dL — AB (ref 3.5–5.0)
ALT: 14 U/L (ref 0–44)
AST: 18 U/L (ref 15–41)
Alkaline Phosphatase: 178 U/L — ABNORMAL HIGH (ref 38–126)
Anion gap: 10 (ref 5–15)
BILIRUBIN TOTAL: 0.5 mg/dL (ref 0.3–1.2)
BUN: 6 mg/dL (ref 6–20)
CO2: 20 mmol/L — ABNORMAL LOW (ref 22–32)
Calcium: 8.2 mg/dL — ABNORMAL LOW (ref 8.9–10.3)
Chloride: 102 mmol/L (ref 98–111)
Creatinine, Ser: 0.57 mg/dL (ref 0.44–1.00)
GFR calc Af Amer: >60 mL/min (ref >60)
GFR calc non Af Amer: >60 mL/min (ref >60)
GLUCOSE: 70 mg/dL (ref 70–99)
Potassium: 3.3 mmol/L — ABNORMAL LOW (ref 3.5–5.1)
Sodium: 132 mmol/L — ABNORMAL LOW (ref 135–145)
Total Protein: 6.6 g/dL (ref 6.5–8.1)

## 2018-05-04 LAB — TYPE AND SCREEN
ABO/RH(D): A POS
ANTIBODY SCREEN: NEGATIVE

## 2018-05-04 LAB — MAGNESIUM: Magnesium: 3.8 mg/dL — ABNORMAL HIGH (ref 1.7–2.4)

## 2018-05-04 LAB — PROTEIN, URINE, 24 HOUR
Collection Interval-UPROT: 24 hours
Protein, 24H Urine: 544 mg/d — ABNORMAL HIGH (ref 50–100)
Protein, Urine: 16 mg/dL
Urine Total Volume-UPROT: 3400 mL

## 2018-05-04 MED ORDER — OXYTOCIN 40 UNITS IN LACTATED RINGERS INFUSION - SIMPLE MED: 2.5000 [IU]/h | INTRAVENOUS | Status: DC

## 2018-05-04 MED ORDER — LACTATED RINGERS IV SOLN: 500.0000 mL | INTRAVENOUS | Status: DC | PRN

## 2018-05-04 MED ORDER — ACETAMINOPHEN 325 MG PO TABS: 650.0000 mg | ORAL_TABLET | ORAL | Status: DC | PRN

## 2018-05-04 MED ORDER — LIDOCAINE HCL (PF) 1 % IJ SOLN: 30.0000 mL | INTRAMUSCULAR | Status: DC | PRN

## 2018-05-04 MED ORDER — ONDANSETRON HCL 4 MG/2ML IJ SOLN: 4.0000 mg | Freq: Four times a day (QID) | INTRAMUSCULAR | Status: DC | PRN

## 2018-05-04 MED ORDER — LACTATED RINGERS IV SOLN
INTRAVENOUS | Status: DC
  Administered 2018-05-04 – 2018-05-06 (×5): via INTRAVENOUS

## 2018-05-04 MED ORDER — VANCOMYCIN HCL IN DEXTROSE 1-5 GM/200ML-% IV SOLN
1000.0000 mg | Freq: Two times a day (BID) | INTRAVENOUS | Status: DC
  Administered 2018-05-04 – 2018-05-06 (×5): 1000 mg via INTRAVENOUS
  Filled 2018-05-04 (×5): qty 200

## 2018-05-04 MED ORDER — MAGNESIUM SULFATE BOLUS VIA INFUSION
4.0000 g | Freq: Once | INTRAVENOUS | Status: AC
  Administered 2018-05-04: 4 g via INTRAVENOUS
  Filled 2018-05-04: qty 500

## 2018-05-04 MED ORDER — MISOPROSTOL 25 MCG QUARTER TABLET
25.0000 ug | ORAL_TABLET | ORAL | Status: DC | PRN
  Administered 2018-05-04 (×2): 25 ug via VAGINAL
  Filled 2018-05-04 (×2): qty 1

## 2018-05-04 MED ORDER — FENTANYL CITRATE (PF) 100 MCG/2ML IJ SOLN
50.0000 ug | INTRAMUSCULAR | Status: DC | PRN
  Administered 2018-05-05 – 2018-05-06 (×9): 100 ug via INTRAVENOUS
  Filled 2018-05-04 (×9): qty 2

## 2018-05-04 MED ORDER — INSULIN ASPART 100 UNIT/ML ~~LOC~~ SOLN: 0.0000 [IU] | Freq: Four times a day (QID) | SUBCUTANEOUS | Status: DC | PRN

## 2018-05-04 MED ORDER — MAGNESIUM SULFATE 40 G IN LACTATED RINGERS - SIMPLE
2.0000 g/h | INTRAVENOUS | Status: DC
  Administered 2018-05-04 – 2018-05-06 (×3): 2 g/h via INTRAVENOUS
  Filled 2018-05-04 (×3): qty 500

## 2018-05-04 MED ORDER — OXYTOCIN 40 UNITS IN LACTATED RINGERS INFUSION - SIMPLE MED: 1.0000 m[IU]/min | INTRAVENOUS | Status: DC

## 2018-05-04 MED ORDER — TERBUTALINE SULFATE 1 MG/ML IJ SOLN: 0.2500 mg | Freq: Once | INTRAMUSCULAR | Status: DC | PRN

## 2018-05-04 MED ORDER — SOD CITRATE-CITRIC ACID 500-334 MG/5ML PO SOLN
30.0000 mL | ORAL | Status: DC | PRN
  Administered 2018-05-06: 30 mL via ORAL
  Filled 2018-05-04: qty 15

## 2018-05-04 MED ORDER — OXYTOCIN BOLUS FROM INFUSION: 500.0000 mL | Freq: Once | INTRAVENOUS | Status: DC

## 2018-05-04 NOTE — Progress Notes (Signed)
Pt PP BS is 126. Pt refused 2 units of Novalog for meal coverage. [place] Curl L Xerxes Agrusa

## 2018-05-04 NOTE — Progress Notes (Addendum)
Francoise SchaumannYolanda M Lubke is a 32 y.o. G3P0020 at 5387w0d admitted for induction of labor due to Pre-eclamptic toxemia of pregnancy..  Subjective: Patient began to report symptoms of preeclampsia this morning: intractable headache, spots in the corner of her vision, and right upper quadrant pain. Consulted Dr. Sallye OberKulwa and the diagnosis was made for preeclampsia with severe features. Patient was transferred to birthing suites for induction of labor. Plan of care and process of induction was discussed with patient. Plan for cytotec induction of labor pending confirmation of vertex position with ultrasound.   Objective: Vitals:   05/04/18 0407 05/04/18 0811 05/04/18 1156 05/04/18 1301  BP: 132/81 119/80 131/85 (!) 148/83  Pulse: 100 99 (!) 104 97  Resp: 18 18 18 20   Temp: 98.7 F (37.1 C) 98 F (36.7 C) 98.8 F (37.1 C) 97.8 F (36.6 C)  TempSrc: Oral Oral Oral Oral  SpO2:  100% 97%   Weight:      Height:       Results for orders placed or performed during the hospital encounter of 05/02/18 (from the past 24 hour(s))  Glucose, capillary     Status: Abnormal   Collection Time: 05/03/18  2:17 PM  Result Value Ref Range   Glucose-Capillary 144 (H) 70 - 99 mg/dL  Glucose, capillary     Status: Abnormal   Collection Time: 05/03/18 10:25 PM  Result Value Ref Range   Glucose-Capillary 128 (H) 70 - 99 mg/dL  Glucose, capillary     Status: None   Collection Time: 05/04/18  8:15 AM  Result Value Ref Range   Glucose-Capillary 72 70 - 99 mg/dL  Glucose, capillary     Status: Abnormal   Collection Time: 05/04/18 10:37 AM  Result Value Ref Range   Glucose-Capillary 126 (H) 70 - 99 mg/dL   FHT:  FHR: 295M120s bpm, variability: moderate,  accelerations:  Present,  decelerations:  Absent UC:   irregular, every 3-5 minutes, mild SVE:   Dilation: Fingertip Effacement (%): Thick Exam by:: Kathalene FramesEllis Greer CNM  Labs: Lab Results  Component Value Date   WBC 7.9 05/02/2018   HGB 11.3 (L) 05/02/2018   HCT 34.4 (L)  05/02/2018   MCV 86.6 05/02/2018   PLT 313 05/02/2018    Assessment / Plan: Admit to L&D Start Magnesium Sulfate per Dr. Sallye OberKulwa Repeat preeclampsia labs Plan cytotec induction of labor, cervix not favorable Discontinue Procardia 30mg  XL while on Magnesium, consider starting Labetalol if patient is hypertensive  Labetalol protocol in place GBS Positive on Penicillin Gestational diabetes A2: On insulin, continue CBG q6hrs with sliding scale q6hrs as needed. NPH 12u am and 24upm. HSV: no lesions on internal and external exam, no prodromal symptoms  Induction of labor due to preeclampsia with severe features  Labor: Progressing normally Preeclampsia:  on magnesium sulfate, no signs or symptoms of toxicity, intake and ouput balanced and labs stable Fetal Wellbeing:  Category I Pain Control:  Labor support without medications I/D:  n/a Anticipated MOD:  NSVD    [name] 05/04/2018, 1:09 PM

## 2018-05-04 NOTE — Progress Notes (Signed)
Samantha SchaumannYolanda M Brewer is a 32 y.o. G3P0020 at 53100w0d admitted for induction of labor due to Pre-eclamptic toxemia of pregnancy..  Subjective: Patient reports feeling mild pain with contractions. She reports feeling well. Discussed foley placement and attempted placement of foley catheter. The catheter would not advance past the external os and patient was not tolerant of repeated attempts. She is contracting too often for another cytotec. Will consider further medical intervention as needed to ensure regular contractions and may reattempt foley placement next time.   Objective: Vitals:   05/04/18 1627 05/04/18 1747 05/04/18 1810 05/04/18 1957  BP: 135/84 (!) 148/85 (!) 144/89 (!) 151/96  Pulse: 99 (!) 113 95 100  Resp: 17     Temp: 97.7 F (36.5 C)     TempSrc: Oral     SpO2:      Weight:      Height:        FHT:  FHR: 120s bpm, variability: moderate,  accelerations:  Present,  decelerations:  Absent UC:   regular, every 1.5-2.5 minutes SVE:   Dilation: 1 Effacement (%): 50 Station: -2 Exam by:: [name], CNM  Intake/Output Summary (Last 24 hours) at 05/04/2018 2239 Last data filed at 05/04/2018 1845 Gross per 24 hour  Intake 470 ml  Output 1575 ml  Net -1105 ml     Labs: Lab Results  Component Value Date   WBC 7.7 05/04/2018   HGB 10.8 (L) 05/04/2018   HCT 33.5 (L) 05/04/2018   MCV 87.9 05/04/2018   PLT 303 05/04/2018    Assessment / Plan: Induction of labor due to preeclampsia with severe features, progressing on cytotec  Labor: Progressing normally Preeclampsia:  on magnesium sulfate, no signs or symptoms of toxicity, intake and ouput balanced and labs stable Fetal Wellbeing:  Category I Pain Control:  Labor support without medications I/D:  n/a Anticipated MOD:  NSVD  [name] 05/04/2018, 10:39 PM

## 2018-05-04 NOTE — Anesthesia Pain Management Evaluation Note (Signed)
  CRNA Pain Management Visit Note  Patient: [name], 32 y.o., female  "[facility] We have an anesthesia team available at all times to provide care throughout the hospital, including epidural management and anesthesia for C-section. I don't know your plan for the delivery whether it a natural birth, water birth, IV sedation, nitrous supplementation, doula or epidural, but we want to meet your pain goals."   1.Was your pain managed to your expectations on prior hospitalizations?   No prior hospitalizations  2.What is your expectation for pain management during this hospitalization?     Epidural and IV pain meds  3.How can we help you reach that goal? IV pain rx  Record the patient's initial score and the patient's pain goal.   Pain: 5  Pain Goal: 7 [facility] wants you to be able to say your pain was always managed very well.  Gabby Rackers 05/04/2018

## 2018-05-04 NOTE — Progress Notes (Signed)
Francoise SchaumannYolanda M Aitken is a 32 y.o. G3P0020 at 5275w0d admitted for induction of labor due to Pre-eclamptic toxemia of pregnancy..  Subjective: Patient reports feeling mild tightening with contractions, not pain. She reports feeling well. Discussed foley placement after next cervical exam and patient is amenable.   Objective: Vitals:   05/04/18 1356 05/04/18 1507 05/04/18 1627 05/04/18 1747  BP: (!) 141/81 (!) 148/95 135/84 (!) 148/85  Pulse: 93 88 99 (!) 113  Resp:   17   Temp:   97.7 F (36.5 C)   TempSrc:   Oral   SpO2:      Weight:      Height:        FHT:  FHR: 130 bpm, variability: moderate,  accelerations:  Present,  decelerations:  Absent UC:   regular, every 1.5-2.5 minutes SVE:   Dilation: 1 Effacement (%): 50 Station: -2 Exam by:: Kathalene FramesEllis Isreal Moline, CNM  Intake/Output Summary (Last 24 hours) at 05/04/2018 1841 Last data filed at 05/04/2018 1740 Gross per 24 hour  Intake 680 ml  Output 2225 ml  Net -1545 ml     Labs: Lab Results  Component Value Date   WBC 7.7 05/04/2018   HGB 10.8 (L) 05/04/2018   HCT 33.5 (L) 05/04/2018   MCV 87.9 05/04/2018   PLT 303 05/04/2018    Assessment / Plan: Induction of labor due to preeclampsia with severe features, progressing on cytotec  Labor: Progressing normally Preeclampsia:  on magnesium sulfate, no signs or symptoms of toxicity, intake and ouput balanced and labs stable Fetal Wellbeing:  Category I Pain Control:  Labor support without medications I/D:  n/a Anticipated MOD:  NSVD  Janeece Riggersllis K Zakyra K[place]uk 05/04/2018, 6:38 PM

## 2018-05-05 LAB — CBC WITH DIFFERENTIAL/PLATELET
Basophils Absolute: 0 10*3/uL (ref 0.0–0.1)
Basophils Relative: 0 %
Eosinophils Absolute: 0.1 10*3/uL (ref 0.0–0.5)
Eosinophils Relative: 2 %
HCT: 32.3 % — ABNORMAL LOW (ref 36.0–46.0)
Hemoglobin: 10.4 g/dL — ABNORMAL LOW (ref 12.0–15.0)
Lymphocytes Relative: 30 %
Lymphs Abs: 2.6 10*3/uL (ref 0.7–4.0)
MCH: 28.3 pg (ref 26.0–34.0)
MCHC: 32.2 g/dL (ref 30.0–36.0)
MCV: 88 fL (ref 80.0–100.0)
Monocytes Absolute: 0.4 10*3/uL (ref 0.1–1.0)
Monocytes Relative: 5 %
Neutro Abs: 5.5 10*3/uL (ref 1.7–7.7)
Neutrophils Relative %: 63 %
Platelets: 261 10*3/uL (ref 150–400)
RBC: 3.67 MIL/uL — AB (ref 3.87–5.11)
RDW: 13.8 % (ref 11.5–15.5)
WBC: 8.7 10*3/uL (ref 4.0–10.5)
nRBC: 0 % (ref 0.0–0.2)

## 2018-05-05 LAB — COMPREHENSIVE METABOLIC PANEL
ALT: 15 U/L (ref 0–44)
AST: 27 U/L (ref 15–41)
Albumin: 2.7 g/dL — ABNORMAL LOW (ref 3.5–5.0)
Alkaline Phosphatase: 169 U/L — ABNORMAL HIGH (ref 38–126)
Anion gap: 8 (ref 5–15)
BUN: 7 mg/dL (ref 6–20)
CHLORIDE: 103 mmol/L (ref 98–111)
CO2: 21 mmol/L — ABNORMAL LOW (ref 22–32)
Calcium: 7.9 mg/dL — ABNORMAL LOW (ref 8.9–10.3)
Creatinine, Ser: 0.56 mg/dL (ref 0.44–1.00)
GFR calc Af Amer: >60 mL/min (ref >60)
Glucose, Bld: 152 mg/dL — ABNORMAL HIGH (ref 70–99)
Potassium: 3.2 mmol/L — ABNORMAL LOW (ref 3.5–5.1)
Sodium: 132 mmol/L — ABNORMAL LOW (ref 135–145)
Total Bilirubin: 0.7 mg/dL (ref 0.3–1.2)
Total Protein: 6.2 g/dL — ABNORMAL LOW (ref 6.5–8.1)

## 2018-05-05 LAB — CBC
HCT: 33.2 % — ABNORMAL LOW (ref 36.0–46.0)
Hemoglobin: 10.9 g/dL — ABNORMAL LOW (ref 12.0–15.0)
MCH: 28.8 pg (ref 26.0–34.0)
MCHC: 32.8 g/dL (ref 30.0–36.0)
MCV: 87.8 fL (ref 80.0–100.0)
Platelets: 296 10*3/uL (ref 150–400)
RBC: 3.78 MIL/uL — ABNORMAL LOW (ref 3.87–5.11)
RDW: 14 % (ref 11.5–15.5)
WBC: 7.9 10*3/uL (ref 4.0–10.5)
nRBC: 0 % (ref 0.0–0.2)

## 2018-05-05 LAB — MAGNESIUM: Magnesium: 3.3 mg/dL — ABNORMAL HIGH (ref 1.7–2.4)

## 2018-05-05 LAB — GLUCOSE, CAPILLARY
GLUCOSE-CAPILLARY: 126 mg/dL — AB (ref 70–99)
Glucose-Capillary: 95 mg/dL (ref 70–99)
Glucose-Capillary: 78 mg/dL (ref 70–99)
Glucose-Capillary: 95 mg/dL (ref 70–99)
Glucose-Capillary: 64 mg/dL — ABNORMAL LOW (ref 70–99)

## 2018-05-05 LAB — RPR: RPR Ser Ql: NONREACTIVE

## 2018-05-05 MED ORDER — OXYTOCIN 40 UNITS IN LACTATED RINGERS INFUSION - SIMPLE MED
1.0000 m[IU]/min | INTRAVENOUS | Status: DC
  Administered 2018-05-05: 1 m[IU]/min via INTRAVENOUS
  Filled 2018-05-05: qty 1000

## 2018-05-05 MED ORDER — OXYTOCIN 40 UNITS IN LACTATED RINGERS INFUSION - SIMPLE MED: 1.0000 m[IU]/min | INTRAVENOUS | Status: DC

## 2018-05-05 NOTE — Progress Notes (Signed)
Samantha SchaumannYolanda M Brewer is a 32 y.o. G3P0020 at 1253w1d being induced for preeclampsia with severe features.  Subjective: [name] reports her contractions are mild. She is asking for more pitocin and we talked about the titration process. She is planning to obtain an epidural when the labor pain intensifies. She denies headache and visual changes but endorses an aching pain in her RUQ.  Objective: BP (!) 148/90   Pulse 92   Temp (!) 97.4 F (36.3 C) (Oral)   Resp 18   Ht 5\' 9"  (1.753 m)   Wt 112.5 kg   LMP 08/25/2017   SpO2 97%   BMI 36.63 kg/m   Vitals:   05/05/18 2017 05/05/18 2034 05/05/18 2038 05/05/18 2113  BP: (!) 161/94  131/72 (!) 148/90  Pulse: 94 93 94 92  Resp: 18 18 18 18   Temp:      TempSrc:      SpO2:      Weight:      Height:        FHT: Category 1, reactive UC:   3 in 10 minutes SVE:   Deferred (3.5/60/-2 at 1752) Pitocin at 20 mu/min MVUs n/a  Assessment/Plan:  IOL at 36+1: on pitocin Preeclampsia with severe features: on magnesium, treated BP @ 2019 with IV labetalol GBS +:on vancomycin 1gm q12h (PCN allergy) GDMII: Insulin discontinued, q2h CBG NICHD category 1, moderate variability, accels present, decels absent. MOD: Anticpate SVD   Jonetta Speakarol Edynn Gillock, CNM 05/05/2018, 9:16 PM

## 2018-05-05 NOTE — Progress Notes (Signed)
ADA Standards of Care 2019 Diabetes in Pregnancy Target Glucose Ranges:  Fasting: 60 - 90 mg/dL Preprandial: 60 - 295105 mg/dL 1 hr postprandial: Less than 140mg /dL (from first bite of meal) 2 hr postprandial: Less than 120 mg/dL (from first bit of meal) Hx. GDM  May consider d/c of SQ NPH while in labor. Start Glucostabilizer/IV insulin if blood sugars>120 mg/dL.  Should not need insulin post-delivery due to hx. Of GDM.  Thanks,  Beryl MeagerJenny Raima Geathers, RN, BC-ADM Inpatient Diabetes Coordinator Pager [phone] (8a-5p)

## 2018-05-05 NOTE — Progress Notes (Signed)
Pt in BR @ 1230: will assess BP once pt returns to bed

## 2018-05-05 NOTE — Progress Notes (Addendum)
Labor Progress Note Samantha SatYolanda M. Briggsis a 32 y.o.G3P0020 at 7077w1d being induced for preeclampsia with severe features.  Subjective: I was summoned to Ms. [name]' room by the RN who stated the patient wished to speak to me. Pt stated she did not have confidence in the anesthesiologist who recently screened her for an epidural and did not want this doctor involved in her care, be it for a bedside epidural placement for labor or as anesthesiologist in the OR were she to require a Cesarean section. Ms. [name] has had extensive spine surgery of the lumbar spine. See the12/13/19  note from anesthesia Dr. Cline Coolsddono's pre-op assessment discussing the potential difficulties of regional anesthesia for Ms. [name].  I checked with staff and found there is not another provider available to place an epidural or provide spinal or general anesthesia in the OR in the event of a C/S overnight.   I notified Ms. [name] that overnight tonight there would not be another anesthesiologist available until morning. She stated perhaps we should stop her labor tonight and recommence in the morning. I advised her that that option is available to her. She has the right to refuse or consent to any treatment and we will respect her wishes. I also discussed the risks and benefits of continuing her induction versus postponing it until morning, including worsening preeclampsia, fetal compromise, and eclampsia. Ms. [name] acknowledged understanding. Ms. [name] stated that she thought the anesthesia doctor in question (Dr. Fritz PickerelHozwe) would be fine to intubate her for general anesthesia, but not to place a spinal or epidural catheter. I advised her that she may not be offered elective general anesthesia because regional anesthesia is lower risk for the baby.  However, given her spinal surgery history, she may.  I consulted Dr. Charlotta Newtonzan and advised her of the patient's preference not to have the current anesthesiologist place her epidural or attend her  in the OR if needed. Dr. Charlotta Newtonzan said if pt does not consent to treatment, document as such and follow the patient's wishes.  Pt asked for time to decide how to proceed. She is not feeling any contractions presently, though they are tracing every 3-5 minutes. I advised her that if we continue her labor overnight we will manage conservatively. Pt had already declined AROM in an earlier conversation when I brought it up as a possibility for later if her contractions remained mild.  Objective: BP (!) 138/92   Pulse 90   Temp (!) 97.4 F (36.3 C) (Oral)   Resp 18   Ht 5\' 9"  (1.753 m)   Wt 112.5 kg   LMP 08/25/2017   SpO2 97%   BMI 36.63 kg/m     FHT: NICHD category 1, moderate variability, accels present, decels absent. UC:   regular, every 3-5 minutes SVE:   Deferred Pitocin at 7820mu/min MVUs n/a  Assessment/Plan:   Labor: 3/60/-2, intact, on pitocin at 20 mu/min Preeclampsia:  on magnesium, mild range blood pressures, treated severe range x 1 @ 2019 Fetal Wellbeing:  category 1 Pain Control:  undecided GBS: on Vancomycin q 12h Anticipated MOD:  SVD    Jonetta Speakarol Karla Pavone, CNM 05/05/2018, 10:59 PM

## 2018-05-05 NOTE — Progress Notes (Signed)
[name] is a 32 y.o. G3P0020 at 6865w0d admitted for induction of labor due to Pre-eclamptic toxemia of pregnancy..  Subjective: Patient reports feeling mild pain with contractions. She reports feeling well. Discussed reattempting foley placement with speculum and attempted placement of foley catheter. The catheter would not advance past the external os. Pitocin started 1x1 to prevent tachysystole.   Objective: Vitals:   05/05/18 0500 05/05/18 0540 05/05/18 0602 05/05/18 0630  BP: (!) 141/89 (!) 151/95 (!) 159/83 121/73  Pulse: 95 98 100 84  Resp: 18  17 17   Temp: 97.6 F (36.4 C)     TempSrc: Oral     SpO2:      Weight:      Height:        FHT:  FHR: 120s bpm, variability: moderate,  accelerations:  Present,  decelerations:  Absent UC:   regular, every 1.5-2.5 minutes SVE:   Dilation: 1 Effacement (%): 60 Station: -2 Exam by:: Kathalene FramesEllis Greer, CNM  Intake/Output Summary (Last 24 hours) at 05/05/2018 0645 Last data filed at 05/05/2018 0600 Gross per 24 hour  Intake 3648.36 ml  Output 2275 ml  Net 1373.36 ml     Labs: Lab Results  Component Value Date   WBC 8.7 05/05/2018   HGB 10.4 (L) 05/05/2018   HCT 32.3 (L) 05/05/2018   MCV 88.0 05/05/2018   PLT 261 05/05/2018    Assessment / Plan: Induction of labor due to preeclampsia with severe features, progressing on cytotec  Labor: Progressing normally Preeclampsia:  on magnesium sulfate, no signs or symptoms of toxicity, intake and ouput balanced and labs stable Fetal Wellbeing:  Category I Pain Control:  Labor support without medications I/D:  n/a Anticipated MOD:  NSVD  [name] 05/05/2018, 6:45 AM

## 2018-05-05 NOTE — Progress Notes (Signed)
[name] is a 32 y.o. G3P0020 at 9172w0d admitted for induction of labor due to Pre-eclamptic toxemia of pregnancy..  Subjective: Patient reports feeling mild pain with contractions. She continues to report right upper quadrant pain. Pitocin increased to 2x2 as contractions have spaced out.   Objective: Vitals:   05/05/18 0500 05/05/18 0540 05/05/18 0602 05/05/18 0630  BP: (!) 141/89 (!) 151/95 (!) 159/83 121/73  Pulse: 95 98 100 84  Resp: 18  17 17   Temp: 97.6 F (36.4 C)     TempSrc: Oral     SpO2:      Weight:      Height:        FHT:  FHR: 120s bpm, variability: moderate,  accelerations:  Present,  decelerations:  Absent UC:   regular, every 1.5-2.5 minutes SVE:   Dilation: 1 Effacement (%): 60 Station: -2 Exam by:: [name], CNM  Intake/Output Summary (Last 24 hours) at 05/05/2018 0645 Last data filed at 05/05/2018 0600 Gross per 24 hour  Intake 3648.36 ml  Output 2275 ml  Net 1373.36 ml     Labs: Lab Results  Component Value Date   WBC 8.7 05/05/2018   HGB 10.4 (L) 05/05/2018   HCT 32.3 (L) 05/05/2018   MCV 88.0 05/05/2018   PLT 261 05/05/2018    Assessment / Plan: Induction of labor due to preeclampsia with severe features, progressing on cytotec  Labor: Progressing normally Preeclampsia:  on magnesium sulfate, no signs or symptoms of toxicity, intake and ouput balanced and labs stable Fetal Wellbeing:  Category I Pain Control:  Labor support without medications I/D:  n/a Anticipated MOD:  NSVD  [name] 05/05/2018, 6:45 AM

## 2018-05-05 NOTE — Progress Notes (Signed)
Patient ID: Francoise SchaumannYolanda M Ludemann, female   DOB: [birth year], 32 y.o.   MRN: [id number]  Pt has a mild headache.  No visual changes or RUQ pain BP (!) 149/92   Pulse 91   Temp (!) 97.4 F (36.3 C) (Oral)   Resp 16   Ht 5\' 9"  (1.753 m)   Wt 112.5 kg   LMP 08/25/2017   SpO2 97%   BMI 36.63 kg/m  Cat 1 toco q3-6 minutes 1/50/-3 Foley bulb placed Decrease pitocin to 466mu/min until foley out Epidural PRN Pt expressed she wants me to manage her labor and deliver if there is an emergency.  She does not want to be transferred to faculty practice BPs are controlled and urine output adequate on the magnesium BS well controlled

## 2018-05-06 ENCOUNTER — Encounter (HOSPITAL_COMMUNITY): Payer: Self-pay

## 2018-05-06 ENCOUNTER — Inpatient Hospital Stay (HOSPITAL_COMMUNITY): Payer: 59 | Admitting: Anesthesiology

## 2018-05-06 ENCOUNTER — Encounter (HOSPITAL_COMMUNITY)
Disposition: A | Discharge status: Home / Self Care | Payer: Self-pay | Source: Home / Self Care | Attending: Obstetrics and Gynecology

## 2018-05-06 LAB — GLUCOSE, CAPILLARY
Glucose-Capillary: 79 mg/dL (ref 70–99)
Glucose-Capillary: 104 mg/dL — ABNORMAL HIGH (ref 70–99)
Glucose-Capillary: 129 mg/dL — ABNORMAL HIGH (ref 70–99)
Glucose-Capillary: 78 mg/dL (ref 70–99)

## 2018-05-06 LAB — COMPREHENSIVE METABOLIC PANEL
ALT: 14 U/L (ref 0–44)
AST: 21 U/L (ref 15–41)
Albumin: 3 g/dL — ABNORMAL LOW (ref 3.5–5.0)
Alkaline Phosphatase: 184 U/L — ABNORMAL HIGH (ref 38–126)
Anion gap: 8 (ref 5–15)
CO2: 21 mmol/L — ABNORMAL LOW (ref 22–32)
CREATININE: 0.56 mg/dL (ref 0.44–1.00)
Calcium: 7.9 mg/dL — ABNORMAL LOW (ref 8.9–10.3)
Chloride: 101 mmol/L (ref 98–111)
GFR calc Af Amer: >60 mL/min (ref >60)
GFR calc non Af Amer: >60 mL/min (ref >60)
Glucose, Bld: 81 mg/dL (ref 70–99)
Potassium: 3.2 mmol/L — ABNORMAL LOW (ref 3.5–5.1)
Sodium: 130 mmol/L — ABNORMAL LOW (ref 135–145)
Total Bilirubin: 0.7 mg/dL (ref 0.3–1.2)
Total Protein: 6.4 g/dL — ABNORMAL LOW (ref 6.5–8.1)

## 2018-05-06 LAB — CULTURE, BETA STREP (GROUP B ONLY)

## 2018-05-06 LAB — LACTATE DEHYDROGENASE: LDH: 133 U/L (ref 98–192)

## 2018-05-06 LAB — CBC
HEMATOCRIT: 34.6 % — AB (ref 36.0–46.0)
HEMOGLOBIN: 11.3 g/dL — AB (ref 12.0–15.0)
MCH: 28.8 pg (ref 26.0–34.0)
MCHC: 32.7 g/dL (ref 30.0–36.0)
MCV: 88 fL (ref 80.0–100.0)
Platelets: 292 10*3/uL (ref 150–400)
RBC: 3.93 MIL/uL (ref 3.87–5.11)
RDW: 14.1 % (ref 11.5–15.5)
WBC: 8 10*3/uL (ref 4.0–10.5)
nRBC: 0 % (ref 0.0–0.2)

## 2018-05-06 LAB — URIC ACID: Uric Acid, Serum: 3.7 mg/dL (ref 2.5–7.1)

## 2018-05-06 LAB — MAGNESIUM: Magnesium: 3.6 mg/dL — ABNORMAL HIGH (ref 1.7–2.4)

## 2018-05-06 SURGERY — Surgical Case
Anesthesia: Spinal

## 2018-05-06 MED ORDER — NALOXONE HCL 0.4 MG/ML IJ SOLN: 0.4000 mg | INTRAMUSCULAR | Status: DC | PRN

## 2018-05-06 MED ORDER — INSULIN NPH (HUMAN) (ISOPHANE) 100 UNIT/ML ~~LOC~~ SUSP
8.0000 [IU] | Freq: Every day | SUBCUTANEOUS | Status: DC
  Filled 2018-05-06: qty 10

## 2018-05-06 MED ORDER — KETOROLAC TROMETHAMINE 30 MG/ML IJ SOLN
30.0000 mg | Freq: Four times a day (QID) | INTRAMUSCULAR | Status: AC | PRN
  Administered 2018-05-07: 30 mg via INTRAVENOUS
  Filled 2018-05-06: qty 1

## 2018-05-06 MED ORDER — MEPERIDINE HCL 25 MG/ML IJ SOLN: 6.2500 mg | INTRAMUSCULAR | Status: DC | PRN

## 2018-05-06 MED ORDER — FENTANYL CITRATE (PF) 100 MCG/2ML IJ SOLN
INTRAMUSCULAR | Status: AC
  Filled 2018-05-06: qty 2

## 2018-05-06 MED ORDER — LACTATED RINGERS IV SOLN
INTRAVENOUS | Status: DC | PRN
  Administered 2018-05-06: 23:00:00 via INTRAVENOUS

## 2018-05-06 MED ORDER — MORPHINE SULFATE (PF) 0.5 MG/ML IJ SOLN
INTRAMUSCULAR | Status: AC
  Filled 2018-05-06: qty 10

## 2018-05-06 MED ORDER — ONDANSETRON HCL 4 MG/2ML IJ SOLN
INTRAMUSCULAR | Status: AC
  Filled 2018-05-06: qty 2

## 2018-05-06 MED ORDER — INSULIN NPH (HUMAN) (ISOPHANE) 100 UNIT/ML ~~LOC~~ SUSP: 12.0000 [IU] | Freq: Every day | SUBCUTANEOUS | Status: DC

## 2018-05-06 MED ORDER — LABETALOL HCL 5 MG/ML IV SOLN: 20.0000 mg | INTRAVENOUS | Status: DC | PRN

## 2018-05-06 MED ORDER — KETOROLAC TROMETHAMINE 30 MG/ML IJ SOLN
30.0000 mg | Freq: Four times a day (QID) | INTRAMUSCULAR | Status: AC | PRN
  Administered 2018-05-07: 30 mg via INTRAMUSCULAR

## 2018-05-06 MED ORDER — DIPHENHYDRAMINE HCL 25 MG PO CAPS
25.0000 mg | ORAL_CAPSULE | ORAL | Status: DC | PRN
  Filled 2018-05-06: qty 1

## 2018-05-06 MED ORDER — BUPIVACAINE IN DEXTROSE 0.75-8.25 % IT SOLN
INTRATHECAL | Status: DC | PRN
  Administered 2018-05-06: 1.5 mL via INTRATHECAL

## 2018-05-06 MED ORDER — MAGNESIUM SULFATE 50 % IJ SOLN
INTRAMUSCULAR | Status: DC | PRN
  Administered 2018-05-06: 2 g via INTRAVENOUS

## 2018-05-06 MED ORDER — HYDRALAZINE HCL 20 MG/ML IJ SOLN: 10.0000 mg | INTRAMUSCULAR | Status: DC | PRN

## 2018-05-06 MED ORDER — OXYTOCIN 10 UNIT/ML IJ SOLN
INTRAMUSCULAR | Status: AC
  Filled 2018-05-06: qty 4

## 2018-05-06 MED ORDER — ONDANSETRON HCL 4 MG/2ML IJ SOLN
INTRAMUSCULAR | Status: DC | PRN
  Administered 2018-05-06: 4 mg via INTRAVENOUS

## 2018-05-06 MED ORDER — SCOPOLAMINE 1 MG/3DAYS TD PT72
MEDICATED_PATCH | TRANSDERMAL | Status: AC
  Filled 2018-05-06: qty 1

## 2018-05-06 MED ORDER — ACETAMINOPHEN 500 MG PO TABS
1000.0000 mg | ORAL_TABLET | Freq: Four times a day (QID) | ORAL | Status: AC
  Administered 2018-05-07 (×3): 1000 mg via ORAL
  Filled 2018-05-06 (×3): qty 2

## 2018-05-06 MED ORDER — FENTANYL CITRATE (PF) 100 MCG/2ML IJ SOLN
INTRAMUSCULAR | Status: DC | PRN
  Administered 2018-05-06: 15 ug via INTRATHECAL

## 2018-05-06 MED ORDER — FENTANYL CITRATE (PF) 100 MCG/2ML IJ SOLN
25.0000 ug | INTRAMUSCULAR | Status: DC | PRN
  Administered 2018-05-07: 50 ug via INTRAVENOUS

## 2018-05-06 MED ORDER — SCOPOLAMINE 1 MG/3DAYS TD PT72
MEDICATED_PATCH | TRANSDERMAL | Status: DC | PRN
  Administered 2018-05-06: 1 via TRANSDERMAL

## 2018-05-06 MED ORDER — SODIUM CHLORIDE 0.9 % IV SOLN
500.0000 mg | Freq: Once | INTRAVENOUS | Status: AC
  Administered 2018-05-06: 500 mg via INTRAVENOUS
  Filled 2018-05-06: qty 500

## 2018-05-06 MED ORDER — LABETALOL HCL 5 MG/ML IV SOLN: 40.0000 mg | INTRAVENOUS | Status: DC | PRN

## 2018-05-06 MED ORDER — LACTATED RINGERS IV SOLN: INTRAVENOUS | Status: DC

## 2018-05-06 MED ORDER — DEXAMETHASONE SODIUM PHOSPHATE 4 MG/ML IJ SOLN
INTRAMUSCULAR | Status: DC | PRN
  Administered 2018-05-06: 4 mg via INTRAVENOUS

## 2018-05-06 MED ORDER — DEXAMETHASONE SODIUM PHOSPHATE 4 MG/ML IJ SOLN
INTRAMUSCULAR | Status: AC
  Filled 2018-05-06: qty 1

## 2018-05-06 MED ORDER — MORPHINE SULFATE (PF) 0.5 MG/ML IJ SOLN
INTRAMUSCULAR | Status: DC | PRN
  Administered 2018-05-06: .15 mg via INTRATHECAL

## 2018-05-06 MED ORDER — SCOPOLAMINE 1 MG/3DAYS TD PT72: 1.0000 | MEDICATED_PATCH | Freq: Once | TRANSDERMAL | Status: DC

## 2018-05-06 MED ORDER — METOCLOPRAMIDE HCL 5 MG/ML IJ SOLN: 10.0000 mg | Freq: Once | INTRAMUSCULAR | Status: DC | PRN

## 2018-05-06 MED ORDER — INSULIN ASPART 100 UNIT/ML ~~LOC~~ SOLN
0.0000 [IU] | Freq: Three times a day (TID) | SUBCUTANEOUS | Status: DC
  Administered 2018-05-07: 2 [IU] via SUBCUTANEOUS

## 2018-05-06 MED ORDER — ONDANSETRON HCL 4 MG/2ML IJ SOLN: 4.0000 mg | Freq: Three times a day (TID) | INTRAMUSCULAR | Status: DC | PRN

## 2018-05-06 MED ORDER — SODIUM CHLORIDE 0.9% FLUSH: 3.0000 mL | INTRAVENOUS | Status: DC | PRN

## 2018-05-06 MED ORDER — PHENYLEPHRINE 8 MG IN D5W 100 ML (0.08MG/ML) PREMIX OPTIME
INJECTION | INTRAVENOUS | Status: DC | PRN
  Administered 2018-05-06: 30 ug/min via INTRAVENOUS

## 2018-05-06 MED ORDER — OXYTOCIN 10 UNIT/ML IJ SOLN
INTRAVENOUS | Status: DC | PRN
  Administered 2018-05-06: 40 [IU] via INTRAVENOUS

## 2018-05-06 MED ORDER — NALOXONE HCL 4 MG/10ML IJ SOLN
1.0000 ug/kg/h | INTRAVENOUS | Status: DC | PRN
  Filled 2018-05-06: qty 5

## 2018-05-06 MED ORDER — MAGNESIUM SULFATE 40 G IN LACTATED RINGERS - SIMPLE
2.0000 g/h | INTRAVENOUS | Status: AC
  Administered 2018-05-07: 2 g/h via INTRAVENOUS
  Filled 2018-05-06: qty 500

## 2018-05-06 MED ORDER — GENTAMICIN SULFATE 40 MG/ML IJ SOLN
5.0000 mg/kg | Freq: Once | INTRAVENOUS | Status: AC
  Administered 2018-05-06: 420 mg via INTRAVENOUS
  Filled 2018-05-06: qty 10.5

## 2018-05-06 MED ORDER — SODIUM CHLORIDE 0.9 % IR SOLN
Status: DC | PRN
  Administered 2018-05-06: 1000 mL

## 2018-05-06 MED ORDER — LABETALOL HCL 5 MG/ML IV SOLN: 80.0000 mg | INTRAVENOUS | Status: DC | PRN

## 2018-05-06 MED ORDER — DIPHENHYDRAMINE HCL 50 MG/ML IJ SOLN: 12.5000 mg | INTRAMUSCULAR | Status: DC | PRN

## 2018-05-06 SURGICAL SUPPLY — 44 items
APL SKNCLS STERI-STRIP NONHPOA (GAUZE/BANDAGES/DRESSINGS) ×1
BARRIER ADHS 3X4 INTERCEED (GAUZE/BANDAGES/DRESSINGS) ×1 IMPLANT
BENZOIN TINCTURE PRP APPL 2/3 (GAUZE/BANDAGES/DRESSINGS) ×2 IMPLANT
BRR ADH 4X3 ABS CNTRL BYND (GAUZE/BANDAGES/DRESSINGS) ×1
CHLORAPREP W/TINT 26ML (MISCELLANEOUS) ×2 IMPLANT
CLAMP CORD UMBIL (MISCELLANEOUS) IMPLANT
CLOTH BEACON ORANGE TIMEOUT ST (SAFETY) ×2 IMPLANT
CLSR STERI-STRIP ANTIMIC 1/2X4 (GAUZE/BANDAGES/DRESSINGS) ×1 IMPLANT
DRSG OPSITE POSTOP 4X10 (GAUZE/BANDAGES/DRESSINGS) ×2 IMPLANT
ELECT REM PT RETURN 9FT ADLT (ELECTROSURGICAL) ×2
ELECTRODE REM PT RTRN 9FT ADLT (ELECTROSURGICAL) ×1 IMPLANT
EXTRACTOR VACUUM KIWI (MISCELLANEOUS) IMPLANT
GLOVE BIOGEL M 6.5 STRL (GLOVE) ×4 IMPLANT
GLOVE BIOGEL PI IND STRL 6.5 (GLOVE) ×1 IMPLANT
GLOVE BIOGEL PI IND STRL 7.0 (GLOVE) ×1 IMPLANT
GLOVE BIOGEL PI INDICATOR 6.5 (GLOVE) ×1
GLOVE BIOGEL PI INDICATOR 7.0 (GLOVE) ×1
GOWN STRL REUS W/TWL LRG LVL3 (GOWN DISPOSABLE) ×6 IMPLANT
HOVERMATT SINGLE USE (MISCELLANEOUS) ×1 IMPLANT
KIT ABG SYR 3ML LUER SLIP (SYRINGE) IMPLANT
NDL HYPO 25X5/8 SAFETYGLIDE (NEEDLE) IMPLANT
NEEDLE HYPO 25X5/8 SAFETYGLIDE (NEEDLE) IMPLANT
NS IRRIG 1000ML POUR BTL (IV SOLUTION) ×2 IMPLANT
PACK C SECTION WH (CUSTOM PROCEDURE TRAY) ×2 IMPLANT
PAD OB MATERNITY 4.3X12.25 (PERSONAL CARE ITEMS) ×2 IMPLANT
PENCIL SMOKE EVAC W/HOLSTER (ELECTROSURGICAL) ×2 IMPLANT
RETRACTOR TRAXI PANNICULUS (MISCELLANEOUS) IMPLANT
RTRCTR C-SECT PINK 25CM LRG (MISCELLANEOUS) IMPLANT
SPONGE GAUZE 4X4 12PLY STER LF (GAUZE/BANDAGES/DRESSINGS) ×2 IMPLANT
SPONGE LAP 18X18 RF (DISPOSABLE) ×6 IMPLANT
STRIP CLOSURE SKIN 1/2X4 (GAUZE/BANDAGES/DRESSINGS) ×2 IMPLANT
SUT PDS AB 0 CT1 27 (SUTURE) ×4 IMPLANT
SUT PLAIN 0 NONE (SUTURE) IMPLANT
SUT VIC AB 0 CTX 36 (SUTURE) ×8
SUT VIC AB 0 CTX36XBRD ANBCTRL (SUTURE) ×3 IMPLANT
SUT VIC AB 2-0 CT1 27 (SUTURE) ×10
SUT VIC AB 2-0 CT1 TAPERPNT 27 (SUTURE) ×1 IMPLANT
SUT VIC AB 3-0 SH 27 (SUTURE)
SUT VIC AB 3-0 SH 27X BRD (SUTURE) IMPLANT
SUT VIC AB 4-0 KS 27 (SUTURE) ×2 IMPLANT
TAPE CLOTH SURG 4X10 WHT LF (GAUZE/BANDAGES/DRESSINGS) ×1 IMPLANT
TOWEL OR 17X24 6PK STRL BLUE (TOWEL DISPOSABLE) ×2 IMPLANT
TRAXI PANNICULUS RETRACTOR (MISCELLANEOUS) ×1
TRAY FOLEY W/BAG SLVR 14FR LF (SET/KITS/TRAYS/PACK) ×2 IMPLANT

## 2018-05-06 NOTE — Progress Notes (Signed)
Labor Progress Note Samantha SatYolanda M. Briggsis a 32 y.o.G3P0020 at 6036w1dbeing induced for preeclampsia with severe features. She says her contractions are stronger now than earlier in the evening, rating them 3/10. States they are manageable for the time being. Cervical exam is essentially unchanged.   Subjective:  Pt asked to see me to tell me her water had broken. She felt a small pop and had leakage of fluid albeit a very small amount. Blood Tinged. No odor.  Objective:  BP (!) 145/74   Pulse 92   Temp 97.9 F (36.6 C) (Oral)   Resp 16   Ht 5\' 9"  (1.753 m)   Wt 112.5 kg   LMP 08/25/2017   SpO2 97%   BMI 36.63 kg/m     FHT: Category 1, baseline 125 UC:   5 in 10 minutes SVE:   4/60/-3 at 0415 Pitocin at 22 mu/min MVUs n/a  Assessment/Plan:   Labor:4/60/-3, SROM at 0415, on pitocin at 22 mu/min Preeclampsia: on magnesium, mild range blood pressures, no severe range since 2017 05/05/18 Fetal Wellbeing:NICHD category 1, moderate variability, accels present, decels absent. Pain Control: undecided GBS: on Vancomycin q 12h Anticipated MOD:SVD    Jonetta SpeakCarol Tarrell Debes, CNM 05/06/2018, 4:23 AM

## 2018-05-06 NOTE — Op Note (Signed)
Cesarean Section Procedure Note  Indications: failure to progress: arrest of dilation  Pre-operative Diagnosis: 36 week 2 day pregnancy.  Post-operative Diagnosis: same  Surgeon: Gerald Leitzara Nolon Yellin   Assistants: [facility] CNM  Anesthesia: Spinal anesthesia  ASA Class: 2   Procedure Details   The patient was seen in the Holding Room. The risks, benefits, complications, treatment options, and expected outcomes were discussed with the patient.  The patient concurred with the proposed plan, giving informed consent.  The site of surgery properly noted/marked. The patient was taken to Operating Room # 9, identified as Francoise SchaumannYolanda M Meinecke and the procedure verified as C-Section Delivery. A Time Out was held and the above information confirmed.  After induction of anesthesia, the patient was draped and prepped in the usual sterile manner. A Pfannenstiel incision was made and carried down through the subcutaneous tissue to the fascia. Fascial incision was made and extended transversely. The fascia was separated from the underlying rectus tissue superiorly and inferiorly. The peritoneum was identified and entered. Peritoneal incision was extended longitudinally. The utero-vesical peritoneal reflection was incised transversely and the bladder flap was bluntly freed from the lower uterine segment. A low transverse uterine incision was made. Delivered from cephalic presentation was a  Female  Infant. Apgar scores per neonatology.  After the umbilical cord was clamped and cut cord blood was obtained for evaluation. The placenta was removed intact and appeared normal. The uterine outline, tubes and ovaries appeared normal. The uterine incision was closed with running locked sutures of 0 vicryl. A second layer of 0 vicryl was used to imbricate the incision.. Hemostasis was observed. Lavage was carried out until clear. interseed was placed along the uterine incision.  The fascia was then reapproximated with running sutures  of 0 pds. The skin was reapproximated with 4-0 vicryl..  Instrument, sponge, and needle counts were correct prior the abdominal closure and at the conclusion of the case.   Findings: Female infant in cephalic presentation. Normal fallopian tubes and ovaries   Estimated Blood Loss:  222 mL        Drains: none         Total IV Fluids:  1600 ml         Specimens: Placenta to pathology           Implants: none         Complications:  None; patient tolerated the procedure well.         Disposition: PACU - hemodynamically stable.         Condition: stable  Attending Attestation: I performed the procedure.

## 2018-05-06 NOTE — Anesthesia Procedure Notes (Signed)
Spinal  Patient location during procedure: OR Staffing Anesthesiologist: Montez Hageman, MD Performed: anesthesiologist  Preanesthetic Checklist Completed: patient identified, site marked, surgical consent, pre-op evaluation, timeout performed, IV checked, risks and benefits discussed and monitors and equipment checked Spinal Block Patient position: sitting Prep: DuraPrep Patient monitoring: heart rate, continuous pulse ox and blood pressure Approach: right paramedian Location: L2-3 Injection technique: single-shot Needle Needle type: Sprotte  Needle gauge: 24 G Needle length: 9 cm Additional Notes Expiration date of kit checked and confirmed. Patient tolerated procedure well, without complications.

## 2018-05-06 NOTE — Progress Notes (Signed)
Patient ID: Francoise SchaumannYolanda M Brewer, female   DOB: 02/24/1986, 32 y.o.   MRN: [id number]  Micah FlesherWent to see pt.  Tracing cat 1 and pt becoming more active.  Erskine SquibbJane, RN increasing pitocin per protocol.  Denies Ha, visual changes or abdominal pain and labs pending.

## 2018-05-06 NOTE — Progress Notes (Signed)
Labor Progress Note  Samantha LagerYolanda M.Briggsis a 32 y.o.G3P0020 at 3736w1dbeing induced for preeclampsia with severe features on magnesium. GDMA2 on insulin.She says her contractions are stronger now than earlier in the evening, rating them 3/10. States they are manageable for the time being. Cervical exam is essentially unchanged.  Subjective: Pt stable in bed feeling cxt but stated strong but able to breath through them, pt wants to talk with anesthia this morning for epidural. I was told in report that pt report SROM, but oupon exam, pt had intact membranes. I talked with pt and reviewed risk and benefit about AROM, pt understood risk and verbally consented to procedure. +FM. Denies vaginal bleeding. Pt denies HA, vision changes, no RUQ pain currently. Last IV labetalol was required on 12/26 @ 2019 Patient Active Problem List   Diagnosis Date Noted  . Severe preeclampsia 05/03/2018  . Gestational diabetes 05/03/2018  . Gestational hypertension 03/05/2018  . Preeclampsia, severe, second trimester 03/04/2018  . Mild pre-eclampsia affecting third pregnancy 02/25/2018  . Morbid obesity (HCC) 02/25/2018   Objective: BP 124/75   Pulse 89   Temp 98 F (36.7 C) (Oral)   Resp 20   Ht 5\' 9"  (1.753 m)   Wt 112.5 kg   LMP 08/25/2017   SpO2 97%   BMI 36.63 kg/m  I/O last 3 completed shifts: In: 7855.9 [P.O.:2710; I.V.:4545.8; IV Piggyback:600.1] Out: 6800 [Urine:6800] No intake/output data recorded. NST: FHR baseline 130s bpm, Variability: moderate, Accelerations:present, Decelerations:  Absent= Cat 1/Reactive CTX:  regular, every 2-4 minutes, lasting 60-80 seconds Uterus gravid, soft non tender, moderate to palpate with contractions.   Unchanged Membranes intact at time of exam  SVE:  Dilation: 4 Effacement (%): 60 Station: -2 Exam by:: [facility] Geniyah Eischeid, CNM Pitocin at 7722mUn/min  AROM, moderate, clear , fetus and pt tolerated well.    Intake/Output Summary (Last 24 hours) at 05/06/2018  0918 Last data filed at 05/06/2018 0900 Gross per 24 hour  Intake 5710.15 ml  Output 5200 ml  Net 510.15 ml    Assessment:  Francoise SchaumannYolanda M Brewer, 32 y.o., [phone], with an IUP @ 5369w2d, presented for  induced for preeclampsia with severe features on magnesium. Pt stable on Pit, wanting epidural today,AROM clear now. BP 124/74, stable on magnesium, asymptomatic currently.   Patient Active Problem List   Diagnosis Date Noted  . Severe preeclampsia 05/03/2018  . Gestational diabetes 05/03/2018  . Gestational hypertension 03/05/2018  . Preeclampsia, severe, second trimester 03/04/2018  . Mild pre-eclampsia affecting third pregnancy 02/25/2018  . Morbid obesity (HCC) 02/25/2018   NICHD: Category 1  Membranes:  AROM, clear, moderate x 0hrs, no s/s of infection  Induction:    Cytotec x12/25 @ 1342, 1805  Foley Bulb: I&O now on 12/25  Pitocin - 22 now  Pain management:               IV pain management: x 12/26 @1130 , 1257, 1527 12/27 @ 0738, 1022, 1144             Epidural placement:  Desired placement now, but anaesthesia declined placement dur to numerous                back surgery. Stated risk for spinal HA to high.   GBS psotive  Abx: vancomycin due to penicillin allergy   GDMA2: No S/SX  CBG (last 3)  Recent Labs    05/05/18 2136 05/06/18 0144 05/06/18 0603  GLUCAP 126* 79 104*   Plan: Continue labor plan Continuous/intermittent monitoring Rest  GDMA2: Q2H CBG with sliding scale.  Preeclampsia with SF: continue magnesium, neuro checks, I&O Frequent position changes to facilitate fetal rotation and descent. Will reassess with cervical exam at 1200 or earlier if necessary Continue pitocin per protocol Anticipate labor progression and vaginal delivery.   Md Su HiltRoberts aware of plan and verbalized agreement.   Dale DurhamJade Izayiah Tibbitts, NP-C, CNM, MSN 05/06/2018. 8:55 AM

## 2018-05-06 NOTE — Progress Notes (Signed)
Request by pt to see MD. She is frustrated with length of induction. She has beenon pitocin for close to 24 hours and has not experienced any cervical change from 12 pm to 5 pm. Pitocin was discontinued to give her a break however she is unsure if she wants it restarted. She is interested in cesarean section due to failure to progress and inadequate pain control during her labor.  I discussed with her options of restarting pitocin with fentanyl or nitrous oxide as options for pain control vs primary cesarean section with risk of infection/ bleeding damage to bowel bladder baby with the need for further surgery. R/o transfusion discussed. She considered options and desires to proceed with cesarean section.

## 2018-05-06 NOTE — Plan of Care (Signed)
Nurse going off told me in report that CNM and MD had requested pitocin to be restarted at 1930.  After introducing myself to the patient, I told her that we needed to restart the pitocin in order to continue her induction.  The patient said that she would not restart the pitocin unless she was able to get an epidural, or she would like a c-section. CNM walked in the room, so RN left to call and discuss situation with anesthesia and allow CNM to talk with pt.  Spoke with Dr. Acey Lavarignan, who said that she was not a candidate for an epidural due to her surgical history.  Went back into room to discuss with patient and CNM. Although pt was given multiple options and was told that we could not and would not start pitocin without her consent, she kept saying that she felt "backed into a corner".  RN and CNM left room to call MD and allow pt time to talk with family.  While RN was out of the room, the wireless US died, so went back into the room to replace monitor.  Pt refused the monitor and said that she "did not want to see this nurse again".  CNM stayed in the room to talk to patient and gave pt monitor to put on herself at her convenience if she chose.  MD currently at bedside discussing options with pt.   Problem: Education: Goal: Knowledge of General Education information will improve Description Including pain rating scale, medication(s)/side effects and non-pharmacologic comfort measures Outcome: Not Progressing   Problem: Health Behavior/Discharge Planning: Goal: Ability to manage health-related needs will improve Outcome: Not Progressing   Problem: Clinical Measurements: Goal: Ability to maintain clinical measurements within normal limits will improve Outcome: Not Progressing   Problem: Coping: Goal: Level of anxiety will decrease Outcome: Not Progressing   Problem: Pain Managment: Goal: General experience of comfort will improve Outcome: Not Progressing   Problem: Education: Goal:  Knowledge of Childbirth will improve Outcome: Not Progressing Goal: Ability to make informed decisions regarding treatment and plan of care will improve Outcome: Not Progressing Goal: Ability to state and carry out methods to decrease the pain will improve Outcome: Not Progressing   Problem: Life Cycle: Goal: Ability to make normal progression through stages of labor will improve Outcome: Not Progressing Goal: Ability to effectively push during vaginal delivery will improve Outcome: Not Progressing   Problem: Role Relationship: Goal: Ability to demonstrate positive interaction with the child will improve Outcome: Not Progressing   Problem: Safety: Goal: Risk of complications during labor and delivery will decrease Outcome: Not Progressing   Problem: Pain Management: Goal: Relief or control of pain from uterine contractions will improve Outcome: Not Progressing   Problem: Education: Goal: Knowledge of disease or condition will improve Outcome: Not Progressing Goal: Knowledge of the prescribed therapeutic regimen will improve Outcome: Not Progressing   Problem: Fluid Volume: Goal: Peripheral tissue perfusion will improve Outcome: Not Progressing   Problem: Clinical Measurements: Goal: Complications related to disease process, condition or treatment will be avoided or minimized Outcome: Not Progressing     Problem: Clinical Measurements: Goal: Will remain free from infection Outcome: Progressing Goal: Diagnostic test results will improve Outcome: Progressing Goal: Respiratory complications will improve Outcome: Progressing Goal: Cardiovascular complication will be avoided Outcome: Progressing   Problem: Activity: Goal: Risk for activity intolerance will decrease Outcome: Progressing   Problem: Nutrition: Goal: Adequate nutrition will be maintained Outcome: Progressing   Problem: Elimination: Goal: Will not experience  complications related to bowel  motility Outcome: Progressing Goal: Will not experience complications related to urinary retention Outcome: Progressing   Problem: Safety: Goal: Ability to remain free from injury will improve Outcome: Progressing   Problem: Skin Integrity: Goal: Risk for impaired skin integrity will decrease Outcome: Progressing   Problem: Coping: Goal: Ability to verbalize concerns and feelings about labor and delivery will improve Outcome: Progressing

## 2018-05-06 NOTE — Transfer of Care (Signed)
Immediate Anesthesia Transfer of Care Note  Patient: Francoise SchaumannYolanda M Levenhagen  Procedure(s) Performed: CESAREAN SECTION (N/A )  Patient Location: PACU  Anesthesia Type:Spinal  Level of Consciousness: awake, alert  and oriented  Airway & Oxygen Therapy: Patient Spontanous Breathing  Post-op Assessment: Report given to RN and Post -op Vital signs reviewed and stable  Post vital signs: Reviewed and stable  Last Vitals:  Vitals Value Taken Time  BP    Temp    Pulse 89 05/06/2018 11:56 PM  Resp    SpO2 98 % 05/06/2018 11:56 PM  Vitals shown include unvalidated device data.  Last Pain:  Vitals:   05/06/18 1901  TempSrc:   PainSc: Asleep      Patients Stated Pain Goal: 9 (05/06/18 0730)  Complications: No apparent anesthesia complications

## 2018-05-06 NOTE — Progress Notes (Signed)
Labor Progress Note  Samantha LagerYolanda M.Briggsis a 32 y.o.G3P0020 at 2736w1dbeing induced for preeclampsia with severe features on magnesium. GDMA2 on insulin.  Subjective: Pt stable in bed with husband and doula at bedside, pt stable. Denies HA, RUQ pain nor vision changes. Pt stated the pain is getting worse and wants to talk with anesthesia at change of shift for possible epidural placement.  Patient Active Problem List   Diagnosis Date Noted  . Severe preeclampsia 05/03/2018  . Gestational diabetes 05/03/2018  . Gestational hypertension 03/05/2018  . Preeclampsia, severe, second trimester 03/04/2018  . Mild pre-eclampsia affecting third pregnancy 02/25/2018  . Morbid obesity (HCC) 02/25/2018   Objective: BP (!) 141/98   Pulse 88   Temp 99.1 F (37.3 C) (Axillary)   Resp 16   Ht 5\' 9"  (1.753 m)   Wt 112.5 kg   LMP 08/25/2017   SpO2 97%   BMI 36.63 kg/m  I/O last 3 completed shifts: In: 7855.9 [P.O.:2710; I.V.:4545.8; IV Piggyback:600.1] Out: 6800 [Urine:6800] Total I/O In: 2194.8 [P.O.:870; I.V.:1324.8] Out: 1800 [Urine:1800] NST: FHR baseline 125s bpm, Variability: moderate, Accelerations:present, Decelerations:  Absent= Cat 1/Reactive CTX:  regular, every 2-5 minutes, lasting 70-120 seconds Uterus gravid, soft non tender, moderate to palpate with contractions.   Unchanged, cervical swelling noted.   SVE:  Dilation: 5 Effacement (%): 80, 90 Station: -1 Exam by:: [facility] Hailei Besser, CNM Pitocin at 4528mUn/min  MVU now 155   Intake/Output Summary (Last 24 hours) at 05/06/2018 1828 Last data filed at 05/06/2018 1500 Gross per 24 hour  Intake 5475.96 ml  Output 5100 ml  Net 375.96 ml    Assessment:  Francoise SchaumannYolanda M Brewer, 32 y.o., [phone], with an IUP @ 2719w2d, presented for  induced for preeclampsia with severe features on magnesium. Pt stable on Pit. BP stable 141/98 asymptomatic. Pt unable to take contraction pain any more, pt wants epidural , pt would like to talk to next  shift about epidural placement. cxt not adequate for vaginal delivery yet.  Patient Active Problem List   Diagnosis Date Noted  . Severe preeclampsia 05/03/2018  . Gestational diabetes 05/03/2018  . Gestational hypertension 03/05/2018  . Preeclampsia, severe, second trimester 03/04/2018  . Mild pre-eclampsia affecting third pregnancy 02/25/2018  . Morbid obesity (HCC) 02/25/2018   NICHD: Category 1  Membranes:  AROM, clear, moderate x 9hrs, no s/s of infection  Induction:    Cytotec x12/25 @ 1342, 1805  Foley Bulb: I&O now on 12/25  Pitocin - 28 now  IUPC: MVU 150  Pain management:               IV pain management: x 12/26 @1130 , 1257, 1527 12/27 @ 0738, 1022, 1144, 1344, 1521, 1723             Epidural placement:  Desired to talk with anesthesia at 7pm GBS psotive  Abx: vancomycin due to penicillin allergy   GDMA2: No S/SX  CBG (last 3)  Recent Labs    05/06/18 0603 05/06/18 1016 05/06/18 1808  GLUCAP 104* 129* 78   Plan: Continue labor plan Continuous/intermittent monitoring Rest GDMA2: Q2H CBG with sliding scale.  Preeclampsia with SF: continue magnesium, neuro checks, I&O Frequent position changes to facilitate fetal rotation and descent. Plan to have pitocin break for 1 hour then restart pitocin at 14mUn. Continue pitocin per protocol Plans to talk with anesthesia at 7pm for consult of epidural placement.  Anticipate labor progression and vaginal delivery.   Md Su HiltRoberts aware of plan and verbalized agreement.  Dale DurhamJade Hayde Kilgour, NP-C, CNM, MSN 05/06/2018. 6:28 PM late entry

## 2018-05-06 NOTE — Progress Notes (Signed)
[name] is a 32 y.o. G3P0020 at 4228w2d   Subjective: No complaints.  Feels contractions but not very strong.  Has received IV fentanyl.  Objective: BP (!) 139/92   Pulse 89   Temp 97.8 F (36.6 C) (Axillary) Comment (Src): pt chewing ice  Resp 18   Ht 5\' 9"  (1.753 m)   Wt 112.5 kg   LMP 08/25/2017   SpO2 97%   BMI 36.63 kg/m  I/O last 3 completed shifts: In: 7855.9 [P.O.:2710; I.V.:4545.8; IV Piggyback:600.1] Out: 6800 [Urine:6800] Total I/O In: 641.9 [P.O.:240; I.V.:401.9] Out: 400 [Urine:400]  FHT:  Cat 1 UC:   q 2-3 ctxs SVE:   Dilation: 4 Effacement (%): 60 Station: -2 Exam by:: [employer] CorporationJade Montana, CNM  Labs: Lab Results  Component Value Date   WBC 7.9 05/05/2018   HGB 10.9 (L) 05/05/2018   HCT 33.2 (L) 05/05/2018   MCV 87.8 05/05/2018   PLT 296 05/05/2018    Assessment / Plan: P0 at 36 2/7wks undergoing induction.  Asymptomatic. Lengthy discussion about pain mgmt and that anesthesia says she is not a good candidate for labor epidural but could potentially have a spinal (h/o back surgeries).  I also discussed the possibility of needing general anesthesia at the time of c-section and pt is aware, understands and is agreeable.    Labor: pitocin per protocol Preeclampsia:  currently stable Fetal Wellbeing:  Category I Pain Control:  Labor support without medications, IV pain meds and Nitrous Oxide  as needed. I/D:  GBS + on Vanc Anticipated MOD:  NSVD  [name] 05/06/2018, 11:32 AM

## 2018-05-06 NOTE — Progress Notes (Signed)
Labor Progress Note  Samantha LagerYolanda M.Briggsis a 32 y.o.G3P0020 at 3636w1dbeing induced for preeclampsia with severe features on magnesium. GDMA2 on insulin.  Subjective: Pt stable in bed with husband and doula at bedside. Denies HA, RUQ pain nor vision changes. Pt stated she is unable to take the pain of the pitocin anymore, pt refused to have the pitocin turned back on, pt requested an CS, I reveiwed  risk and benifit of a CS with pt and risk and benefits of general anesthesia, pt stated she did not care and could keep going like this, she stated she wanted a CS, anesthesia declines to place an epidural due rods in pt lumbar spine. Pt unhappy and foggy brained with last dose of fentanyl. Pt stated she was able to get a small nap but unable to fully relax. Pt requested to speak the the Samantha. Rock NephewPt called her mother whom gave me permission and made me talk with her about pt care, pts mother stated she was a lawyer and threaten me stating I was not supposed to inform the pt of the risk and benefit of any medical thing. I tried to in for her that the risk and benefits of procedures is part of my job and responsibility as a ArchivistCNM. I consulted with [name] and she endorsed she will come in to talk with the pt. I went back into the room to inform the pt that [name] was coming back, at that time the RN was attempting to put the baby back on the monitor and the pt became angry saying no and refusing to allow the RN to put her back on the monitor, she then told the RN to leave the room and did not want to receive care from her again, and now pt says she does not want to talk with [name] and wants the pitocin restarted with another dose of fentanyl. Please see RN note for further details of events.  Patient Active Problem List   Diagnosis Date Noted  . Severe preeclampsia 05/03/2018  . Gestational diabetes 05/03/2018  . Gestational hypertension 03/05/2018  . Preeclampsia, severe, second trimester 03/04/2018  . Mild  pre-eclampsia affecting third pregnancy 02/25/2018  . Morbid obesity (HCC) 02/25/2018   Objective: BP (!) 151/84   Pulse 100   Temp 99.1 F (37.3 C) (Axillary)   Resp 18   Ht 5\' 9"  (1.753 m)   Wt 112.5 kg   LMP 08/25/2017   SpO2 97%   BMI 36.63 kg/m  I/O last 3 completed shifts: In: 8159.1 [P.O.:2795; I.V.:4964; IV Piggyback:400.1] Out: 7600 [Urine:7600] No intake/output data recorded. NST: FHR baseline 130s bpm, Variability: moderate, Accelerations:present, Decelerations:  Absent= Cat 1/Reactive, small variable were noted , but last 20 mins there have been none.  CTX:  none Uterus gravid, soft non tender, mild to palpate with contractions.   Derfered cervical exam: Below was last exam at 1700  SVE:  Dilation: 5 Effacement (%): 80, 90 Station: -1 Exam by:: [facility] Shaquille Brewer, [name] Pitocin at 950mUn/min   Intake/Output Summary (Last 24 hours) at 05/06/2018 2049 Last data filed at 05/06/2018 1900 Gross per 24 hour  Intake 4898.59 ml  Output 4700 ml  Net 198.59 ml    Assessment:  [name], 32 y.o., [phone], with an IUP @ 1414w2d, presented for  induced for preeclampsia with severe features on magnesium. Pt stable on Pit. BP stable 141/98 asymptomatic. Pt unable to take contraction pain any more, pt wants CS, then changed her mind see  subjective note above, pt angry and fatigued. Pt not coping well.  Patient Active Problem List   Diagnosis Date Noted  . Severe preeclampsia 05/03/2018  . Gestational diabetes 05/03/2018  . Gestational hypertension 03/05/2018  . Preeclampsia, severe, second trimester 03/04/2018  . Mild pre-eclampsia affecting third pregnancy 02/25/2018  . Morbid obesity (HCC) 02/25/2018   NICHD: Category 1  Membranes:  AROM, clear, moderate x 13hrs, no s/s of infection  Induction:    Cytotec x12/25 @ 1342, 1805  Foley Bulb: I&O now on 12/25  Pitocin - 28 now  IUPC: MVU 0  Pain management:               IV pain management: x 12/26 @1130 , 1257, 1527  12/27 @ 0738, 1022, 1144, 1344, 1521, 1723             Epidural placement: unable to place due to maternal metal rods in back.  GBS psotive  Abx: vancomycin due to penicillin allergy   GDMA2: No S/SX  CBG (last 3)  Recent Labs    05/06/18 0603 05/06/18 1016 05/06/18 1808  GLUCAP 104* 129* 78   Plan: Continue labor plan Continuous/intermittent monitoring Rest GDMA2: Q2H CBG with sliding scale.  Preeclampsia with SF: continue magnesium, neuro checks, I&O Frequent position changes to facilitate fetal rotation and descent. Pt refuses for pitocin to be turned back on [name] consulted and on her way in to talk with pt.  Anticipate labor protraction and possible CS per pt.   Md [place] aware of plan and verbalized agreement.   Dale DurhamJade Taj Arteaga, NP-C, CNM, MSN 05/06/2018. 8:49 PM late entry

## 2018-05-06 NOTE — Progress Notes (Signed)
Labor Progress Note  Patsy LagerYolanda M.Briggsis a 32 y.o.G3P0020 at 8736w1dbeing induced for preeclampsia with severe features on magnesium. GDMA2 on insulin.  Subjective: Pt stable in bed with husband and doula at bedside, pt stable. Denies HA, RUQ pain nor vision changes. Discussed risk and benefit with pt about IUPC procedure for advancement of pitocin, pt verbalized upstanding and consented.   Patient Active Problem List   Diagnosis Date Noted  . Severe preeclampsia 05/03/2018  . Gestational diabetes 05/03/2018  . Gestational hypertension 03/05/2018  . Preeclampsia, severe, second trimester 03/04/2018  . Mild pre-eclampsia affecting third pregnancy 02/25/2018  . Morbid obesity (HCC) 02/25/2018   Objective: BP (!) 142/90   Pulse 86   Temp 97.8 F (36.6 C) (Axillary) Comment (Src): pt chewing ice  Resp 20   Ht 5\' 9"  (1.753 m)   Wt 112.5 kg   LMP 08/25/2017   SpO2 97%   BMI 36.63 kg/m  I/O last 3 completed shifts: In: 7855.9 [P.O.:2710; I.V.:4545.8; IV Piggyback:600.1] Out: 6800 [Urine:6800] Total I/O In: 1570.2 [P.O.:720; I.V.:850.2] Out: 1400 [Urine:1400] NST: FHR baseline 125s bpm, Variability: moderate, Accelerations:present, Decelerations:  Absent= Cat 1/Reactive CTX:  regular, every 2-5 minutes, lasting 70-120 seconds Uterus gravid, soft non tender, moderate to palpate with contractions.   Unchanged Membranes intact at time of exam  SVE:  Dilation: 5 Effacement (%): 80 Station: -1 Exam by:: [facility] Delshawn Stech, CNM Pitocin at 5524mUn/min  IUPC place, pt and fetus tolerated well, MVU now 120   Intake/Output Summary (Last 24 hours) at 05/06/2018 1316 Last data filed at 05/06/2018 1200 Gross per 24 hour  Intake 6103.07 ml  Output 5700 ml  Net 403.07 ml    Assessment:  Francoise SchaumannYolanda M Waldren, 32 y.o., [phone], with an IUP @ 5245w2d, presented for  induced for preeclampsia with severe features on magnesium. Pt stable on Pit. BP stable 134/86 asymptomatic.Marland Kitchen.  Patient Active Problem  List   Diagnosis Date Noted  . Severe preeclampsia 05/03/2018  . Gestational diabetes 05/03/2018  . Gestational hypertension 03/05/2018  . Preeclampsia, severe, second trimester 03/04/2018  . Mild pre-eclampsia affecting third pregnancy 02/25/2018  . Morbid obesity (HCC) 02/25/2018   NICHD: Category 1  Membranes:  AROM, clear, moderate x 4hrs, no s/s of infection  Induction:    Cytotec x12/25 @ 1342, 1805  Foley Bulb: I&O now on 12/25  Pitocin - 24 now  IUPC: MVU 120  Pain management:               IV pain management: x 12/26 @1130 , 1257, 1527 12/27 @ 0738, 1022, 1144             Epidural placement:  Desired placement now, but anaesthesia declined placement dur to numerous                back surgery. Stated risk for spinal HA to high.   GBS psotive  Abx: vancomycin due to penicillin allergy   GDMA2: No S/SX  CBG (last 3)  Recent Labs    05/06/18 0144 05/06/18 0603 05/06/18 1016  GLUCAP 79 104* 129*   Plan: Continue labor plan Continuous/intermittent monitoring Rest GDMA2: Q2H CBG with sliding scale.  Preeclampsia with SF: continue magnesium, neuro checks, I&O Frequent position changes to facilitate fetal rotation and descent. Will reassess with cervical exam at 1600 or earlier if necessary Continue pitocin per protocol Anticipate labor progression and vaginal delivery.   Md Su HiltRoberts aware of plan and verbalized agreement.   Dale DurhamJade Veronia Laprise, NP-C, CNM, MSN 05/06/2018. 1:16 PM

## 2018-05-06 NOTE — Anesthesia Preprocedure Evaluation (Signed)
Anesthesia Evaluation  Patient identified by MRN, date of birth, ID band Patient awake    Reviewed: Allergy & Precautions, NPO status , Patient's Chart, lab work & pertinent test results  Airway Mallampati: II  TM Distance: >3 FB Neck ROM: Full    Dental no notable dental hx.    Pulmonary neg pulmonary ROS, former smoker,    Pulmonary exam normal breath sounds clear to auscultation       Cardiovascular hypertension, Normal cardiovascular exam Rhythm:Regular Rate:Normal     Neuro/Psych negative neurological ROS  negative psych ROS   GI/Hepatic negative GI ROS, Neg liver ROS,   Endo/Other  diabetes, Gestational, Insulin Dependent  Renal/GU negative Renal ROS  negative genitourinary   Musculoskeletal Multiple low back surgeries   Abdominal   Peds negative pediatric ROS (+)  Hematology negative hematology ROS (+)   Anesthesia Other Findings   Reproductive/Obstetrics (+) Pregnancy (Severe Pre ecclampsia)                             Anesthesia Physical Anesthesia Plan  ASA: III  Anesthesia Plan: Spinal   Post-op Pain Management:    Induction:   PONV Risk Score and Plan: 3 and Ondansetron, Dexamethasone, Scopolamine patch - Pre-op and Treatment may vary due to age or medical condition  Airway Management Planned: Natural Airway  Additional Equipment:   Intra-op Plan:   Post-operative Plan:   Informed Consent: I have reviewed the patients History and Physical, chart, labs and discussed the procedure including the risks, benefits and alternatives for the proposed anesthesia with the patient or authorized representative who has indicated his/her understanding and acceptance.   Dental advisory given  Plan Discussed with: CRNA  Anesthesia Plan Comments:         Anesthesia Quick Evaluation

## 2018-05-06 NOTE — Progress Notes (Signed)
Called to the room by patient with [name], CNM on several occasions to discuss plan of care with the patient.  Patient had a conference with anaesthesiologist on call this evening and has expressed that she does not care to have her as part of the care team.  Patient does desire an epidural for pain management.  Risks and benefits discussed with patient by [name], CNM of continuing induction vs leaving medication where it is currently.  Patient's wishes acknowledged.  Per CNM, will leave pitocin at 22 mili-units and continue to monitor overnight.

## 2018-05-07 ENCOUNTER — Encounter (HOSPITAL_COMMUNITY): Payer: Self-pay

## 2018-05-07 LAB — COMPREHENSIVE METABOLIC PANEL
ALBUMIN: 2.6 g/dL — AB (ref 3.5–5.0)
ALT: 14 U/L (ref 0–44)
AST: 24 U/L (ref 15–41)
Alkaline Phosphatase: 172 U/L — ABNORMAL HIGH (ref 38–126)
Anion gap: 8 (ref 5–15)
BUN: <5 mg/dL — ABNORMAL LOW (ref 6–20)
CO2: 23 mmol/L (ref 22–32)
Calcium: 8 mg/dL — ABNORMAL LOW (ref 8.9–10.3)
Chloride: 103 mmol/L (ref 98–111)
Creatinine, Ser: 0.67 mg/dL (ref 0.44–1.00)
GFR calc Af Amer: >60 mL/min (ref >60)
GFR calc non Af Amer: >60 mL/min (ref >60)
GLUCOSE: 102 mg/dL — AB (ref 70–99)
Potassium: 4 mmol/L (ref 3.5–5.1)
Sodium: 134 mmol/L — ABNORMAL LOW (ref 135–145)
Total Bilirubin: 0.8 mg/dL (ref 0.3–1.2)
Total Protein: 5.5 g/dL — ABNORMAL LOW (ref 6.5–8.1)

## 2018-05-07 LAB — CBC
HCT: 32.5 % — ABNORMAL LOW (ref 36.0–46.0)
HEMOGLOBIN: 10.4 g/dL — AB (ref 12.0–15.0)
MCH: 28.2 pg (ref 26.0–34.0)
MCHC: 32 g/dL (ref 30.0–36.0)
MCV: 88.1 fL (ref 80.0–100.0)
Platelets: 293 10*3/uL (ref 150–400)
RBC: 3.69 MIL/uL — ABNORMAL LOW (ref 3.87–5.11)
RDW: 14 % (ref 11.5–15.5)
WBC: 13.1 10*3/uL — AB (ref 4.0–10.5)
nRBC: 0 % (ref 0.0–0.2)

## 2018-05-07 LAB — GLUCOSE, CAPILLARY
Glucose-Capillary: 104 mg/dL — ABNORMAL HIGH (ref 70–99)
Glucose-Capillary: 109 mg/dL — ABNORMAL HIGH (ref 70–99)
Glucose-Capillary: 143 mg/dL — ABNORMAL HIGH (ref 70–99)

## 2018-05-07 LAB — MAGNESIUM: Magnesium: 3.9 mg/dL — ABNORMAL HIGH (ref 1.7–2.4)

## 2018-05-07 MED ORDER — LACTATED RINGERS IV SOLN
INTRAVENOUS | Status: DC
  Administered 2018-05-06 – 2018-05-07 (×4): via INTRAVENOUS

## 2018-05-07 MED ORDER — OXYCODONE HCL 5 MG PO TABS
5.0000 mg | ORAL_TABLET | ORAL | Status: DC | PRN
  Administered 2018-05-07: 5 mg via ORAL
  Administered 2018-05-08: 10 mg via ORAL
  Administered 2018-05-08: 5 mg via ORAL
  Administered 2018-05-08: 10 mg via ORAL
  Administered 2018-05-08: 5 mg via ORAL
  Administered 2018-05-08 – 2018-05-09 (×2): 10 mg via ORAL
  Filled 2018-05-07 (×2): qty 2
  Filled 2018-05-07: qty 1
  Filled 2018-05-07 (×3): qty 2
  Filled 2018-05-07: qty 1
  Filled 2018-05-07: qty 2

## 2018-05-07 MED ORDER — MENTHOL 3 MG MT LOZG: 1.0000 | LOZENGE | OROMUCOSAL | Status: DC | PRN

## 2018-05-07 MED ORDER — KETOROLAC TROMETHAMINE 30 MG/ML IJ SOLN: 30.0000 mg | Freq: Once | INTRAMUSCULAR | Status: DC

## 2018-05-07 MED ORDER — TETANUS-DIPHTH-ACELL PERTUSSIS 5-2.5-18.5 LF-MCG/0.5 IM SUSP: 0.5000 mL | Freq: Once | INTRAMUSCULAR | Status: DC

## 2018-05-07 MED ORDER — SIMETHICONE 80 MG PO CHEW
80.0000 mg | CHEWABLE_TABLET | Freq: Three times a day (TID) | ORAL | Status: DC
  Administered 2018-05-07 – 2018-05-09 (×8): 80 mg via ORAL
  Filled 2018-05-07 (×7): qty 1

## 2018-05-07 MED ORDER — WITCH HAZEL-GLYCERIN EX PADS: 1.0000 "application " | MEDICATED_PAD | CUTANEOUS | Status: DC | PRN

## 2018-05-07 MED ORDER — ZOLPIDEM TARTRATE 5 MG PO TABS: 5.0000 mg | ORAL_TABLET | Freq: Every evening | ORAL | Status: DC | PRN

## 2018-05-07 MED ORDER — KETOROLAC TROMETHAMINE 30 MG/ML IJ SOLN
INTRAMUSCULAR | Status: AC
  Filled 2018-05-07: qty 1

## 2018-05-07 MED ORDER — FENTANYL CITRATE (PF) 100 MCG/2ML IJ SOLN
INTRAMUSCULAR | Status: AC
  Administered 2018-05-07: 50 ug via INTRAVENOUS
  Filled 2018-05-07: qty 2

## 2018-05-07 MED ORDER — DIBUCAINE 1 % RE OINT: 1.0000 "application " | TOPICAL_OINTMENT | RECTAL | Status: DC | PRN

## 2018-05-07 MED ORDER — OXYTOCIN 40 UNITS IN LACTATED RINGERS INFUSION - SIMPLE MED: 2.5000 [IU]/h | INTRAVENOUS | Status: AC

## 2018-05-07 MED ORDER — PRENATAL MULTIVITAMIN CH
1.0000 | ORAL_TABLET | Freq: Every day | ORAL | Status: DC
  Administered 2018-05-07 – 2018-05-09 (×3): 1 via ORAL
  Filled 2018-05-07 (×3): qty 1

## 2018-05-07 MED ORDER — SENNOSIDES-DOCUSATE SODIUM 8.6-50 MG PO TABS
2.0000 | ORAL_TABLET | ORAL | Status: DC
  Administered 2018-05-07 – 2018-05-09 (×3): 2 via ORAL
  Filled 2018-05-07 (×3): qty 2

## 2018-05-07 MED ORDER — SIMETHICONE 80 MG PO CHEW
80.0000 mg | CHEWABLE_TABLET | ORAL | Status: DC
  Administered 2018-05-08 – 2018-05-09 (×2): 80 mg via ORAL
  Filled 2018-05-07 (×3): qty 1

## 2018-05-07 MED ORDER — DIPHENHYDRAMINE HCL 25 MG PO CAPS: 25.0000 mg | ORAL_CAPSULE | Freq: Four times a day (QID) | ORAL | Status: DC | PRN

## 2018-05-07 MED ORDER — SIMETHICONE 80 MG PO CHEW: 80.0000 mg | CHEWABLE_TABLET | ORAL | Status: DC | PRN

## 2018-05-07 MED ORDER — COCONUT OIL OIL
1.0000 "application " | TOPICAL_OIL | Status: DC | PRN
  Administered 2018-05-07: 1 via TOPICAL
  Filled 2018-05-07: qty 120

## 2018-05-07 NOTE — Lactation Note (Signed)
This note was copied from a baby's chart. Lactation Consultation Note  Patient Name: [name] OIZTI'W Date: 05/07/2018   GHTN on MgSO4, GDM (insulin), C/Section, LPTI   Visited with P1 Mom of LPTI at 42 hrs old.  Mom exhausted.  Reviewed importance of feeding baby every 3 hrs.  Recommended supplementing baby after every latch to breast, per LPTI guidelines.  Mom holding baby semi-STS, gown under part of baby's body.  Baby's temp a little low.  Talked about importance of keeping baby STS on her chest, or swaddled in blanket.  Assisted Mom with her first double pumping on initiation setting.  62m flanges used for a good fit.  Mom reminded of importance of washing and air drying pump parts soon after pumping.    Plan- 1- Offer breast at least every 3 hrs. 2- supplement with 5-10 ml (lst 24 hrs) EBM+/formula by slow flow bottle (reviewed how to pace bottle feed) 3- pump both breasts on initiation setting after baby breastfeeds 4- Keep baby STS as much as possible 5- breast massage and hand expression to a spoon prn. 6- ask for help prn.  Mom states she has a DEBP at home.  [name]12/28/2019, 9:03 AM

## 2018-05-07 NOTE — Anesthesia Postprocedure Evaluation (Signed)
Anesthesia Post Note  Patient: Francoise SchaumannYolanda M Yanni  Procedure(s) Performed: CESAREAN SECTION (N/A )     Patient location during evaluation: [facility] Anesthesia Type: Spinal Level of consciousness: oriented and awake and alert Pain management: pain level controlled Vital Signs Assessment: post-procedure vital signs reviewed and stable Respiratory status: spontaneous breathing, respiratory function stable and patient connected to nasal cannula oxygen Cardiovascular status: blood pressure returned to baseline and stable Postop Assessment: no headache, no backache and no apparent nausea or vomiting Anesthetic complications: no    Last Vitals:  Vitals:   05/07/18 0500 05/07/18 0503  BP: 121/83 129/84  Pulse: 91 (!) 114  Resp:    Temp:    SpO2:      Last Pain:  Vitals:   05/07/18 0523  TempSrc:   PainSc: 3    Pain Goal: Patients Stated Pain Goal: 3 (05/07/18 0523)               Trellis PaganiniBREWER,Alexsandro Salek N

## 2018-05-07 NOTE — Lactation Note (Signed)
This note was copied from a baby's chart. Lactation Consultation Note  Patient Name: [name] [id number]'s Date: 05/07/2018 Reason for consult: Initial assessment;1st time breastfeeding;Late-preterm 34-36.6wks P1, 5 hours female infant, LPTI and had hypoglycemia after delivery  BF concerns: Pre-eclampsia, c/s and very little colostrum present (smear). Per mom, did not attend any BF classes in pregnancy. Per mom, has DEBP at home. LC entered room infant cuing to breastfeed. Mom demostrated hand expression very small amount of colostrum present at this time. After a few attempts mom latched infant on right breast using cross-cradle position, few suckles observed and infant breastfeed for 12 minutes. Mom did STS afterwards. LC discussed LPTI policy, mom breastfeed according hunger cues , mom not exceed past 3 hours without breastfeeding infant and discussed supplementation with EBM/ or possibly formula. Mom shown how to use DEBP & how to disassemble, clean, & reassemble parts. Mom will use DEBP every 3 hours. LC discussed I & O. Mom made aware of O/P services, breastfeeding support groups, community resources, and our phone # for post-discharge questions.  BF plans: 1. Mom will breastfeed according hunger cues, mom will not exceed 3 hours without breastfeeding infant. 2. Mom will hand express and use DEBP every 3 hours and give infant back any EBM. 3. Discussed with mom LPTI and infant may need be supplement with formula.  4. Parents will do as much STS as possible.  5. Mom will call Nurse or LC if she has any questions, concerns or need assistance with latching infant to breast.  Maternal Data Formula Feeding for Exclusion: No Has patient been taught Hand Expression?: Yes(Snear of colostrum present. ) Does the patient have breastfeeding experience prior to this delivery?: No  Feeding Feeding Type: Breast Fed  LATCH Score Latch: Repeated attempts needed to sustain latch, nipple held  in mouth throughout feeding, stimulation needed to elicit sucking reflex.  Audible Swallowing: A few with stimulation  Type of Nipple: Everted at rest and after stimulation  Comfort (Breast/Nipple): Soft / non-tender  Hold (Positioning): Assistance needed to correctly position infant at breast and maintain latch.  LATCH Score: 7  Interventions Interventions: Breast feeding basics reviewed;Assisted with latch;Breast massage;DEBP;Hand pump  Lactation Tools Discussed/Used WIC Program: No Pump Review: Setup, frequency, and cleaning;Milk Storage Initiated by:: Danelle Earthlyobin Polo Mcmartin, IBCLC Date initiated:: 05/07/18   Consult Status Consult Status: Follow-up Date: 05/07/18 Follow-up type: In-patient    Danelle EarthlyRobin Yasmen Cortner 05/07/2018, 3:47 AM

## 2018-05-07 NOTE — Progress Notes (Addendum)
Subjective: Postpartum Day 1: Primary for Cesarean Delivery failure to prgress Patient reports tolerating PO.  Feeling tired. Pt states she does not want to take her insulin this morning since her glucose level was normal. She is waiting to see what her postprandial from breakfast and then will decide if she will take her insulin.  Objective: Vital signs in last 24 hours: Temp:  [97.1 F (36.2 C)-99.1 F (37.3 C)] 98.1 F (36.7 C) (12/28 0811) Pulse Rate:  [67-114] 67 (12/28 0811) Resp:  [16-21] 18 (12/28 0811) BP: (110-162)/(77-113) 130/77 (12/28 0811) SpO2:  [95 %-99 %] 96 % (12/28 16100811)  Physical Exam:  General: no distress Lochia: appropriate Uterine Fundus: firm Incision: no drainage on bandage DVT Evaluation: No evidence of DVT seen on physical exam.  Recent Labs    05/06/18 1338 05/07/18 0616  HGB 11.3* 10.4*  HCT 34.6* 32.5*   Results for orders placed or performed during the hospital encounter of 05/02/18 (from the past 24 hour(s))  CBC     Status: Abnormal   Collection Time: 05/06/18  1:38 PM  Result Value Ref Range   WBC 8.0 4.0 - 10.5 K/uL   RBC 3.93 3.87 - 5.11 MIL/uL   Hemoglobin 11.3 (L) 12.0 - 15.0 g/dL   HCT 96.034.6 (L) 45.436.0 - 09.846.0 %   MCV 88.0 80.0 - 100.0 fL   MCH 28.8 26.0 - 34.0 pg   MCHC 32.7 30.0 - 36.0 g/dL   RDW 11.914.1 14.711.5 - 82.915.5 %   Platelets 292 150 - 400 K/uL   nRBC 0.0 0.0 - 0.2 %  Comprehensive metabolic panel     Status: Abnormal   Collection Time: 05/06/18  1:38 PM  Result Value Ref Range   Sodium 130 (L) 135 - 145 mmol/L   Potassium 3.2 (L) 3.5 - 5.1 mmol/L   Chloride 101 98 - 111 mmol/L   CO2 21 (L) 22 - 32 mmol/L   Glucose, Bld 81 70 - 99 mg/dL   BUN <5 (L) 6 - 20 mg/dL   Creatinine, Ser 5.620.56 0.44 - 1.00 mg/dL   Calcium 7.9 (L) 8.9 - 10.3 mg/dL   Total Protein 6.4 (L) 6.5 - 8.1 g/dL   Albumin 3.0 (L) 3.5 - 5.0 g/dL   AST 21 15 - 41 U/L   ALT 14 0 - 44 U/L   Alkaline Phosphatase 184 (H) 38 - 126 U/L   Total Bilirubin 0.7 0.3 -  1.2 mg/dL   GFR calc non Af Amer >60 >60 mL/min   GFR calc Af Amer >60 >60 mL/min   Anion gap 8 5 - 15  Uric acid     Status: None   Collection Time: 05/06/18  1:38 PM  Result Value Ref Range   Uric Acid, Serum 3.7 2.5 - 7.1 mg/dL  Lactate dehydrogenase     Status: None   Collection Time: 05/06/18  1:38 PM  Result Value Ref Range   LDH 133 98 - 192 U/L  Magnesium     Status: Abnormal   Collection Time: 05/06/18  1:38 PM  Result Value Ref Range   Magnesium 3.6 (H) 1.7 - 2.4 mg/dL  Glucose, capillary     Status: None   Collection Time: 05/06/18  6:08 PM  Result Value Ref Range   Glucose-Capillary 78 70 - 99 mg/dL   Comment 1 Document in Chart   Glucose, capillary     Status: Abnormal   Collection Time: 05/07/18  6:08 AM  Result Value Ref  Range   Glucose-Capillary 104 (H) 70 - 99 mg/dL  CBC     Status: Abnormal   Collection Time: 05/07/18  6:16 AM  Result Value Ref Range   WBC 13.1 (H) 4.0 - 10.5 K/uL   RBC 3.69 (L) 3.87 - 5.11 MIL/uL   Hemoglobin 10.4 (L) 12.0 - 15.0 g/dL   HCT 16.132.5 (L) 09.636.0 - 04.546.0 %   MCV 88.1 80.0 - 100.0 fL   MCH 28.2 26.0 - 34.0 pg   MCHC 32.0 30.0 - 36.0 g/dL   RDW 40.914.0 81.111.5 - 91.415.5 %   Platelets 293 150 - 400 K/uL   nRBC 0.0 0.0 - 0.2 %  Comprehensive metabolic panel     Status: Abnormal   Collection Time: 05/07/18  6:16 AM  Result Value Ref Range   Sodium 134 (L) 135 - 145 mmol/L   Potassium 4.0 3.5 - 5.1 mmol/L   Chloride 103 98 - 111 mmol/L   CO2 23 22 - 32 mmol/L   Glucose, Bld 102 (H) 70 - 99 mg/dL   BUN <5 (L) 6 - 20 mg/dL   Creatinine, Ser 7.820.67 0.44 - 1.00 mg/dL   Calcium 8.0 (L) 8.9 - 10.3 mg/dL   Total Protein 5.5 (L) 6.5 - 8.1 g/dL   Albumin 2.6 (L) 3.5 - 5.0 g/dL   AST 24 15 - 41 U/L   ALT 14 0 - 44 U/L   Alkaline Phosphatase 172 (H) 38 - 126 U/L   Total Bilirubin 0.8 0.3 - 1.2 mg/dL   GFR calc non Af Amer >60 >60 mL/min   GFR calc Af Amer >60 >60 mL/min   Anion gap 8 5 - 15  Magnesium     Status: Abnormal   Collection Time:  05/07/18  6:16 AM  Result Value Ref Range   Magnesium 3.9 (H) 1.7 - 2.4 mg/dL   Assessment/Plan:  Status post Cesarean section. Postoperative course complicated by Preeclampsia ; Gestational Diabetic on Insulin  Continue current care. Baby in Room Anticipate Discharge 05/09/18 Elmore GuiseLori A Clemmons CNM 05/07/2018, 10:34 AM    Patient seen and examined.  Continue magnsesium for total of 24 hours therapy post delivery. Will monitor bp closely. Normal PIH labs this am Gestational diabetes. Plan to d/c insulin if 2 hour postprandial is less than 120...  Routine postpartum care

## 2018-05-08 LAB — GLUCOSE, CAPILLARY
Glucose-Capillary: 121 mg/dL — ABNORMAL HIGH (ref 70–99)
Glucose-Capillary: 81 mg/dL (ref 70–99)

## 2018-05-08 MED ORDER — ACETAMINOPHEN 325 MG PO TABS
650.0000 mg | ORAL_TABLET | ORAL | Status: DC
  Administered 2018-05-08 – 2018-05-09 (×5): 650 mg via ORAL
  Filled 2018-05-08 (×6): qty 2

## 2018-05-08 NOTE — Progress Notes (Signed)
Patient declines to receive her NPH insulin this a.m.

## 2018-05-08 NOTE — Lactation Note (Signed)
This note was copied from a baby's chart. Lactation Consultation Note: Asked by NT to assist mom with breast feeding. Baby now 4834 hours old. born at 5336 w 2 d. Has not fed in 4 hours. Mom has baby wrapped snug in blankets at breast. Baby asleep. Unwrapped and undressed, diaper changed. Assisted mom with latch in football hold. Mom only able to hand express very small drop of Colostrum. Baby latched well, few swallows noted. Nursed for 15 min. Latched to other breast in football hold. Baby fussy Mom wants to bottle feed formula because he isn't getting enough. Encouragement given. Mom bottle feeding formula as I left room. Encouraged to post pump to promote milk supply. Mom states she has DEBP for home but is unsure of brand. No questions at present. To call for assist prn  Patient Name: [name] SenderYolanda Bhandari ZOXWR'UToday's Date: 05/08/2018 Reason for consult: Follow-up assessment;Late-preterm 34-36.6wks   Maternal Data Formula Feeding for Exclusion: No Has patient been taught Hand Expression?: Yes Does the patient have breastfeeding experience prior to this delivery?: No  Feeding Feeding Type: Breast Fed  LATCH Score Latch: Grasps breast easily, tongue down, lips flanged, rhythmical sucking.  Audible Swallowing: A few with stimulation  Type of Nipple: Everted at rest and after stimulation  Comfort (Breast/Nipple): Soft / non-tender  Hold (Positioning): Assistance needed to correctly position infant at breast and maintain latch.  LATCH Score: 8  Interventions Interventions: Breast feeding basics reviewed;Assisted with latch;Hand express;Breast compression;DEBP  Lactation Tools Discussed/Used     Consult Status Consult Status: Follow-up Date: 05/09/18 Follow-up type: In-patient    Pamelia HoitWeeks, Patrece Tallie D 05/08/2018, 8:59 AM

## 2018-05-08 NOTE — Progress Notes (Signed)
Patient continues to sllep with baby in bed with her.  This RN has had many conversations with patient about safety of infant and not sleeping with baby in bed with her.  Patient also continues to empty hat after urinating without measuring.

## 2018-05-08 NOTE — Plan of Care (Signed)
  Problem: Nutrition: Goal: Adequate nutrition will be maintained Outcome: Progressing   Problem: Pain Managment: Goal: General experience of comfort will improve Outcome: Progressing   Problem: Safety: Goal: Ability to remain free from injury will improve Outcome: Progressing   Problem: Education: Goal: Knowledge of disease or condition will improve Outcome: Progressing   Problem: Education: Goal: Knowledge of condition will improve Outcome: Progressing

## 2018-05-08 NOTE — Progress Notes (Addendum)
Samantha SchaumannYolanda M Streeper [id number] Postpartum Postoperative Day # 2  Samantha SchaumannYolanda M Kempton, [id number], 5740w2d, S/P Primary LT Cesarean Section due to FTP, was on pitocin and magnesium for IOL for preeclampsia with SF. Pt also had GDMA2 controlled with insulin.   Subjective: Patient up ad lib, denies syncope or dizziness. Reports consuming regular diet without issues and denies N/V. Patient reports 0 bowel movement + passing flatus.  Denies issues with urination and reports bleeding is "lighter."  Patient is breast and bottle feeding and reports going well.  Desires undecided for postpartum contraception.  Pain is being appropriately managed with use of po meds. Pt denies HA, vision changes, no RUQ pain, nor s/sx of anemia. Pt denies cp or sob, able to ambulate. Pt falling asleep with baby in arms and RN reports educating pt. Pt taking own BS and recording it, pt declines receiving insulin coverage or hypoglycemia agents. Pt was educated on GDM and DM2 and aware of risk and benefits of needing insulin and not taking it. Pt reported that she sees a therapist and knows all about PP depression and pt denies feeling depressed now, BF left and moved to another state. Pt has mother visiting and feels content.    Objective: Patient Vitals for the past 24 hrs:  BP Temp Temp src Pulse Resp SpO2  05/08/18 1205 (!) 141/89 98.5 F (36.9 C) Oral 97 20 98 %  05/08/18 0810 128/90 98.9 F (37.2 C) Oral 88 20 95 %  05/08/18 0328 (!) 138/92 98.8 F (37.1 C) Oral 90 - 97 %  05/07/18 2333 111/64 98.4 F (36.9 C) Oral 87 18 95 %  05/07/18 2031 126/82 97.6 F (36.4 C) Oral 79 18 98 %  05/07/18 1637 (!) 147/84 97.8 F (36.6 C) Oral 91 18 98 %     Physical Exam:  General: alert, cooperative, appears stated age and no distress Mood/Affect: flat Lungs: clear to auscultation, no wheezes, rales or rhonchi, symmetric air entry.  Heart: normal rate, regular rhythm, normal S1, S2, no murmurs, rubs, clicks or gallops. Breast: breasts  appear normal, no suspicious masses, no skin or nipple changes or axillary nodes. Abdomen:  + bowel sounds, soft, non-tender Incision: healing well, no significant drainage, no dehiscence, no significant erythema, Honeycomb dressing  Uterine Fundus: firm, involution -2 Lochia: appropriate Skin: Warm, Dry. DVT Evaluation: No evidence of DVT seen on physical exam. Negative Homan's sign. No cords or calf tenderness. 1+ pitting edema to bilateral lower extremities No clonus, 2+ patellar DTR.  Labs: Recent Labs    05/05/18 1544 05/06/18 1338 05/07/18 0616  HGB 10.9* 11.3* 10.4*  HCT 33.2* 34.6* 32.5*  WBC 7.9 8.0 13.1*    CBG (last 3)  Recent Labs    05/07/18 1801 05/07/18 2359 05/08/18 0812  GLUCAP 143* 121* 81     I/O: I/O last 3 completed shifts: In: 6638.6 [P.O.:2308; I.V.:4130.7; IV Piggyback:199.8] Out: 5881 [Urine:5650; Blood:231]   Assessment Postpartum Postoperative Day # 2. [name], [id number], 6840w2d, S/P Primary LT Cesarean Section due to FTP from IOL for PReE with SF and GDMA2 controlled on insluin: Fasting was 81 and PP have been slightly elevated with normalizing, pt declining to take insluin, and monitoring own BS daily.  Pt stable. -2 Involution. Breast and bottle Feeding. PreE with SF: current BP 141/89 and asymptomatic, stable, 24 hours PP mag of at 0100 on 12/29. Hemodynamically Stable with hgb from 11.3 to 10.9 post op, asymtpmoatic.  Plan: Continue other mgmt as ordered VTE Prophylactics:  SCD, ambulated as tolerates.  GDMA2 with insulin: Consulted with Dr Richardson Doppole , may stop NPH insulin. Talk about metformin due to NOB visit hgba1c was elevated. PreE with SF: monitor BP if >150/90 will start procardia.  Pain control: Motrin/Tylenol/Narcotics PRN Education given regarding options for contraception, including barrier methods, injectable contraception, IUD placement, oral contraceptives.  Plan for discharge tomorrow, Breastfeeding, Lactation consult,  Circumcision prior to discharge and Contraception undecided  Dr. Richardson Doppole to be updated on patient status  [facility] Surgery CenterJade Callia Swim NP-C, CNM 05/08/2018, 12:46 PM

## 2018-05-09 LAB — GLUCOSE, CAPILLARY: Glucose-Capillary: 91 mg/dL (ref 70–99)

## 2018-05-09 MED ORDER — OXYCODONE HCL 5 MG PO TABS: 5.0000 mg | ORAL_TABLET | ORAL | 0 refills | Status: DC | PRN

## 2018-05-09 MED ORDER — NIFEDIPINE ER OSMOTIC RELEASE 30 MG PO TB24
30.0000 mg | ORAL_TABLET | Freq: Once | ORAL | Status: AC
  Administered 2018-05-09: 30 mg via ORAL
  Filled 2018-05-09: qty 1

## 2018-05-09 NOTE — Progress Notes (Signed)
Discharge instructions reviewed, Baby and me book reviewed with patient, meds discussed, new med Procardia started today, f/u schedule listed on discharge. Pt verbalizes understanding.

## 2018-05-09 NOTE — Discharge Summary (Signed)
Primary CS OB Discharge Summary     Patient Name: [name] DOB: [birth year] MRN: [id number]  Date of admission: 05/02/2018 Delivering MD: Christophe Louis  Date of delivery: 05/06/2018 Type of delivery: primary CS  Newborn Data: Sex: Baby Female Circumcision: Circ needed today by Dr Charlesetta Garibaldi Live born female  Birth Weight: 6 lb 4 oz (2835 g) APGAR: ,   Newborn Delivery   Birth date/time:  05/06/2018 22:44:00 Delivery type:  C-Section, Low Transverse Trial of labor:  Yes C-section categorization:  Primary     Feeding: breast and bottle Infant being discharge to home with mother in stable condition.   Admitting diagnosis: 36wks, high blood pressure and headache Intrauterine pregnancy: [redacted]w[redacted]d    Secondary diagnosis:  Active Problems:   Severe preeclampsia   Gestational diabetes   Normal postpartum course                                Complications: None                                                              Intrapartum Procedures: cesarean: low cervical, transverse and GBS prophylaxis Postpartum Procedures: none Complications-Operative and Postpartum: none Augmentation: AROM   History of Present Illness: Ms. YSHARRON PETRUSKAis a 32y.o. female, [id number] who presents at 372w2deeks gestation. [facility]  Her pregnancy has been complicated by:  Patient Active Problem List   Diagnosis Date Noted  . Normal postpartum course 05/09/2018  . Severe preeclampsia 05/03/2018  . Gestational diabetes 05/03/2018  . Gestational hypertension 03/05/2018  . Preeclampsia, severe, second trimester 03/04/2018  . Mild pre-eclampsia affecting third pregnancy 02/25/2018  . Morbid obesity (HCBryan10/18/2019   Hospital Course--Unscheduled Cesarean:  Admitted 12/27. positive GBS.  Utilized fentanyl and spinal for cs for pain management.   Has Primary LTCS.  Due to FTP, was originally admitted for IOL for pree with SF  and GDM on insulin, she was consented for cesarean, with Dr. CoLandry Mellowerforming a Primary LTCS under spinal anesthesia, with delivery of a viable baby female, with weight and Apgars as listed below. Infant was in good condition and remained at the patient's bedside.  The patient was taken to recovery in good condition.  Patient planned to bottle and breast feed.  On post-op day 1, patient was doing well, tolerating a regular diet, with Hgb of 10.4.  Throughout her stay, her physical exam was WNL, her incision was CDI, and her vital signs remained stable.  By post-op day 2, she was up ad lib, tolerating a regular diet, with good pain control with po med.  She was deemed to have received the full benefit of her hospital stay, and was discharged home in stable condition.  Contraceptive choice was undecided.  Pt denies HA, vision changes nor ruq pain, pt does endorses having mild dysuria.   Physical exam  Vitals:   05/08/18 1601 05/08/18 1926 05/08/18 2354 05/09/18 0542  BP: (!) 146/87 (!) 145/89 139/85 (!) 134/93  Pulse: (!) 102 (!) 105 (!) 105 (!) 101  Resp: '18 17 17 17  '$ Temp: 98.8 F (37.1 C) 99 F (37.2 C)  99 F (37.2 C) 98 F (36.7 C)  TempSrc: Oral Oral Oral Oral  SpO2: 97% 99% 94% 95%  Weight:      Height:       General: alert, cooperative and no distress Lochia: appropriate Uterine Fundus: firm Incision: Healing well with no significant drainage, No significant erythema, Dressing is clean, dry, and intact, honeycomb dressing CDI Perineum: Intact DVT Evaluation: No evidence of DVT seen on physical exam. Negative Homan's sign. No cords or calf tenderness. 1+ pitting edema noted to bilateral lower extremities.   Labs: Lab Results  Component Value Date   WBC 13.1 (H) 05/07/2018   HGB 10.4 (L) 05/07/2018   HCT 32.5 (L) 05/07/2018   MCV 88.1 05/07/2018   PLT 293 05/07/2018   CMP Latest Ref Rng & Units 05/07/2018  Glucose 70 - 99 mg/dL 102(H)  BUN 6 - 20 mg/dL <5(L)  Creatinine 0.44 -  1.00 mg/dL 0.67  Sodium 135 - 145 mmol/L 134(L)  Potassium 3.5 - 5.1 mmol/L 4.0  Chloride 98 - 111 mmol/L 103  CO2 22 - 32 mmol/L 23  Calcium 8.9 - 10.3 mg/dL 8.0(L)  Total Protein 6.5 - 8.1 g/dL 5.5(L)  Total Bilirubin 0.3 - 1.2 mg/dL 0.8  Alkaline Phos 38 - 126 U/L 172(H)  AST 15 - 41 U/L 24  ALT 0 - 44 U/L 14    Date of discharge: 05/09/2018 Discharge Diagnoses: Pre-term delivery for preeclampsia with SF, GDMA2 with insulin, and Primary CS for FTP Discharge instruction: per After Visit Summary and "Baby and Me Booklet".  After visit meds:  Allergies as of 05/09/2018      Reactions   Latex Itching   Morphine And Related Itching   Lower doses OK   Penicillins Rash, Other (See Comments)   Childhood reaction ( unknown) Has patient had a PCN reaction causing immediate rash, facial/tongue/throat swelling, SOB or lightheadedness with hypotension: Yes Has patient had a PCN reaction causing severe rash involving mucus membranes or skin necrosis: No Has patient had a PCN reaction that required hospitalization: No Has patient had a PCN reaction occurring within the last 10 years: No If all of the above answers are "NO", then may proceed with Cephalosporin use.      Medication List    TAKE these medications   acetaminophen 325 MG tablet Commonly known as:  TYLENOL Take 2 tablets (650 mg total) by mouth every 6 (six) hours as needed for moderate pain (Take 1-2 tablets every six hours as needed for pain).   albuterol 108 (90 Base) MCG/ACT inhaler Commonly known as:  PROVENTIL HFA;VENTOLIN HFA Inhale 1-2 puffs into the lungs every 6 (six) hours as needed for wheezing or shortness of breath.   Alcohol Swabs Pads 1 application by Does not apply route 4 (four) times daily.   blood glucose meter kit and supplies Kit Dispense based on patient and insurance preference. Use up to four times daily as directed. (FOR ICD-9 250.00, 250.01).   butalbital-acetaminophen-caffeine 50-325-40 MG  tablet Commonly known as:  FIORICET, ESGIC Take 1 tablet by mouth every 6 (six) hours as needed for headache.   calcium carbonate 500 MG chewable tablet Commonly known as:  TUMS - dosed in mg elemental calcium Chew 1 tablet by mouth 2 (two) times daily as needed for indigestion or heartburn.   insulin lispro 100 UNIT/ML injection Commonly known as:  HUMALOG Inject 0.16 mLs (16 Units total) into the skin daily with supper. What changed:    how much to  take  when to take this  additional instructions   insulin NPH Human 100 UNIT/ML injection Commonly known as:  HUMULIN N,NOVOLIN N Inject 0.1 mLs (10 Units total) into the skin daily before breakfast. What changed:    how much to take  when to take this  additional instructions   Insulin Syringe-Needle U-100 30G X 1/2" 1 ML Misc Commonly known as:  SAFETY INSULIN SYRINGES 1 Syringe by Does not apply route 4 (four) times daily.   magnesium gluconate 500 MG tablet Commonly known as:  MAGONATE Take 500 mg by mouth daily.   NIFEdipine 30 MG 24 hr tablet Commonly known as:  PROCARDIA-XL/NIFEDICAL-XL Take 30 mg by mouth at bedtime.   onetouch ultrasoft lancets Use as instructed   oxyCODONE 5 MG immediate release tablet Commonly known as:  Oxy IR/ROXICODONE Take 1 tablet (5 mg total) by mouth every 4 (four) hours as needed for moderate pain.   prenatal multivitamin Tabs tablet Take 1 tablet by mouth daily at 12 noon.   valACYclovir 500 MG tablet Commonly known as:  VALTREX Take 500 mg by mouth daily.            Discharge Care Instructions  (From admission, onward)         Start     Ordered   05/09/18 0000  Discharge wound care:    Comments:  Take dressing off on day 5-7 postpartum.  Report increased drainage, redness or warmth. Clean with water, let soap trickle down body. Can leave steri strips on until they fall off or take them off gently at day 10. Keep open to air, clean and dry.   05/09/18 0551           Activity:           unrestricted and pelvic rest Advance as tolerated. Pelvic rest for 6 weeks.  Diet:                routine Medications: PNV, Colace, Iron and oxy IR Postpartum contraception: Undecided Condition:  Pt discharge to home with baby in stable Preeclampsia with SF: Baby love to visit home, check BP if >150/90 call us, report ssx. GDM: continue to check sugar x1 week Dysuria: UC Pending   Meds: Allergies as of 05/09/2018      Reactions   Latex Itching   Morphine And Related Itching   Lower doses OK   Penicillins Rash, Other (See Comments)   Childhood reaction ( unknown) Has patient had a PCN reaction causing immediate rash, facial/tongue/throat swelling, SOB or lightheadedness with hypotension: Yes Has patient had a PCN reaction causing severe rash involving mucus membranes or skin necrosis: No Has patient had a PCN reaction that required hospitalization: No Has patient had a PCN reaction occurring within the last 10 years: No If all of the above answers are "NO", then may proceed with Cephalosporin use.      Medication List    TAKE these medications   acetaminophen 325 MG tablet Commonly known as:  TYLENOL Take 2 tablets (650 mg total) by mouth every 6 (six) hours as needed for moderate pain (Take 1-2 tablets every six hours as needed for pain).   albuterol 108 (90 Base) MCG/ACT inhaler Commonly known as:  PROVENTIL HFA;VENTOLIN HFA Inhale 1-2 puffs into the lungs every 6 (six) hours as needed for wheezing or shortness of breath.   Alcohol Swabs Pads 1 application by Does not apply route 4 (four) times daily.   blood glucose meter kit  and supplies Kit Dispense based on patient and insurance preference. Use up to four times daily as directed. (FOR ICD-9 250.00, 250.01).   butalbital-acetaminophen-caffeine 50-325-40 MG tablet Commonly known as:  FIORICET, ESGIC Take 1 tablet by mouth every 6 (six) hours as needed for headache.   calcium carbonate 500 MG  chewable tablet Commonly known as:  TUMS - dosed in mg elemental calcium Chew 1 tablet by mouth 2 (two) times daily as needed for indigestion or heartburn.   insulin lispro 100 UNIT/ML injection Commonly known as:  HUMALOG Inject 0.16 mLs (16 Units total) into the skin daily with supper. What changed:    how much to take  when to take this  additional instructions   insulin NPH Human 100 UNIT/ML injection Commonly known as:  HUMULIN N,NOVOLIN N Inject 0.1 mLs (10 Units total) into the skin daily before breakfast. What changed:    how much to take  when to take this  additional instructions   Insulin Syringe-Needle U-100 30G X 1/2" 1 ML Misc Commonly known as:  SAFETY INSULIN SYRINGES 1 Syringe by Does not apply route 4 (four) times daily.   magnesium gluconate 500 MG tablet Commonly known as:  MAGONATE Take 500 mg by mouth daily.   NIFEdipine 30 MG 24 hr tablet Commonly known as:  PROCARDIA-XL/NIFEDICAL-XL Take 30 mg by mouth at bedtime.   onetouch ultrasoft lancets Use as instructed   oxyCODONE 5 MG immediate release tablet Commonly known as:  Oxy IR/ROXICODONE Take 1 tablet (5 mg total) by mouth every 4 (four) hours as needed for moderate pain.   prenatal multivitamin Tabs tablet Take 1 tablet by mouth daily at 12 noon.   valACYclovir 500 MG tablet Commonly known as:  VALTREX Take 500 mg by mouth daily.            Discharge Care Instructions  (From admission, onward)         Start     Ordered   05/09/18 0000  Discharge wound care:    Comments:  Take dressing off on day 5-7 postpartum.  Report increased drainage, redness or warmth. Clean with water, let soap trickle down body. Can leave steri strips on until they fall off or take them off gently at day 10. Keep open to air, clean and dry.   05/09/18 0551          Discharge Follow Up:  [facility] Follow up in 1 week(s).   Specialty:   Obstetrics and Gynecology Why:  make 1 week f/u for BP, BS  and mental health check then 4 week insicion chack and 6 week PP visit.  Contact information: [address] [place] [id number] Fletcher, NP-C, CNM 05/09/2018, 6:37 AM  Noralyn Pick, [place]

## 2018-05-09 NOTE — Lactation Note (Signed)
This note was copied from a baby's chart. Lactation Consultation Note  Patient Name: [name] MVHQI'OToday's Date: 05/09/2018 Reason for consult: Follow-up assessment;Primapara;1st time breastfeeding;Late-preterm 34-36.6wks  Visited with P1 Mom of LPTI at 8757 hrs old.  Baby at 3% weight loss.  Mom has been breastfeeding and supplementing baby with formula, volume >30 ml at each feeding.  Baby's output good.  Encouraged Mom to double pump after baby breastfeeds, irregardless of how long baby breastfeeds.  Mom states her breasts are filling.  Breasts compressible and soft to touch.   Mom has a Motif DEBP at home.    Talked about importance of regular stimulation as baby is LPTI.  Mom states she will once she gets home.  Encouraged breast massage and hand expression along with double pumping.   Mom desires OP lactation follow-up.  Request made to clinic.    Mom aware of OP lactation support available to her.     Interventions Interventions: Breast feeding basics reviewed;Skin to skin;Breast massage;Hand express;DEBP  Lactation Tools Discussed/Used Tools: Pump;Bottle Breast pump type: Double-Electric Breast Pump   Consult Status Consult Status: Complete Date: 05/09/18 Follow-up type: Out-patient    [name], Liseth Wann E 05/09/2018, 8:34 AM

## 2018-05-10 ENCOUNTER — Inpatient Hospital Stay (HOSPITAL_COMMUNITY): Admission: RE | Admit: 2018-05-10 | Payer: 59 | Source: Ambulatory Visit

## 2018-05-10 ENCOUNTER — Observation Stay (HOSPITAL_COMMUNITY)
Admission: AD | Admit: 2018-05-10 | Discharge: 2018-05-12 | DRG: 776 | Disposition: A | Discharge status: Home / Self Care | Payer: 59 | Attending: Obstetrics & Gynecology | Admitting: Obstetrics & Gynecology

## 2018-05-10 DIAGNOSIS — Z87891 Personal history of nicotine dependence: Secondary | ICD-10-CM

## 2018-05-10 DIAGNOSIS — O165 Unspecified maternal hypertension, complicating the puerperium: Secondary | ICD-10-CM | POA: Diagnosis not present

## 2018-05-10 DIAGNOSIS — O135 Gestational [pregnancy-induced] hypertension without significant proteinuria, complicating the puerperium: Secondary | ICD-10-CM | POA: Diagnosis not present

## 2018-05-10 DIAGNOSIS — Z880 Allergy status to penicillin: Secondary | ICD-10-CM

## 2018-05-10 DIAGNOSIS — O24434 Gestational diabetes mellitus in the puerperium, insulin controlled: Secondary | ICD-10-CM | POA: Diagnosis present

## 2018-05-10 DIAGNOSIS — R030 Elevated blood-pressure reading, without diagnosis of hypertension: Secondary | ICD-10-CM | POA: Diagnosis present

## 2018-05-10 LAB — COMPREHENSIVE METABOLIC PANEL
ALT: 28 U/L (ref 0–44)
AST: 28 U/L (ref 15–41)
Albumin: 2.8 g/dL — ABNORMAL LOW (ref 3.5–5.0)
Alkaline Phosphatase: 128 U/L — ABNORMAL HIGH (ref 38–126)
Anion gap: 8 (ref 5–15)
BUN: 8 mg/dL (ref 6–20)
CO2: 24 mmol/L (ref 22–32)
Calcium: 8.9 mg/dL (ref 8.9–10.3)
Chloride: 104 mmol/L (ref 98–111)
Creatinine, Ser: 0.74 mg/dL (ref 0.44–1.00)
GFR calc Af Amer: >60 mL/min (ref >60)
GFR calc non Af Amer: >60 mL/min (ref >60)
GLUCOSE: 87 mg/dL (ref 70–99)
Potassium: 3.6 mmol/L (ref 3.5–5.1)
SODIUM: 136 mmol/L (ref 135–145)
Total Bilirubin: 0.8 mg/dL (ref 0.3–1.2)
Total Protein: 6.1 g/dL — ABNORMAL LOW (ref 6.5–8.1)

## 2018-05-10 LAB — CBC
HCT: 32.6 % — ABNORMAL LOW (ref 36.0–46.0)
Hemoglobin: 10.4 g/dL — ABNORMAL LOW (ref 12.0–15.0)
MCH: 28.6 pg (ref 26.0–34.0)
MCHC: 31.9 g/dL (ref 30.0–36.0)
MCV: 89.6 fL (ref 80.0–100.0)
Platelets: 305 10*3/uL (ref 150–400)
RBC: 3.64 MIL/uL — AB (ref 3.87–5.11)
RDW: 14.4 % (ref 11.5–15.5)
WBC: 8 10*3/uL (ref 4.0–10.5)
nRBC: 0 % (ref 0.0–0.2)

## 2018-05-10 LAB — URINE CULTURE: Culture: <10000 — AB

## 2018-05-10 NOTE — MAU Note (Signed)
Pt here with c/o high BP tonight at home. Was discharged last night from the hospital, having been on Mag with pre-eclampsia and delivered on 05/06/18. Went home on Procardia.

## 2018-05-11 ENCOUNTER — Encounter (HOSPITAL_COMMUNITY): Payer: Self-pay

## 2018-05-11 ENCOUNTER — Other Ambulatory Visit: Payer: Self-pay

## 2018-05-11 DIAGNOSIS — O165 Unspecified maternal hypertension, complicating the puerperium: Secondary | ICD-10-CM | POA: Diagnosis present

## 2018-05-11 DIAGNOSIS — O24434 Gestational diabetes mellitus in the puerperium, insulin controlled: Secondary | ICD-10-CM | POA: Diagnosis not present

## 2018-05-11 DIAGNOSIS — O1410 Severe pre-eclampsia, unspecified trimester: Secondary | ICD-10-CM | POA: Insufficient documentation

## 2018-05-11 DIAGNOSIS — Z87891 Personal history of nicotine dependence: Secondary | ICD-10-CM | POA: Diagnosis not present

## 2018-05-11 DIAGNOSIS — O135 Gestational [pregnancy-induced] hypertension without significant proteinuria, complicating the puerperium: Secondary | ICD-10-CM | POA: Diagnosis not present

## 2018-05-11 LAB — TYPE AND SCREEN
ABO/RH(D): A POS
Antibody Screen: NEGATIVE

## 2018-05-11 LAB — PROTEIN / CREATININE RATIO, URINE
Creatinine, Urine: 74 mg/dL
PROTEIN CREATININE RATIO: 0.18 mg/mg{creat} — AB (ref 0.00–0.15)
Total Protein, Urine: 13 mg/dL

## 2018-05-11 MED ORDER — HYDRALAZINE HCL 20 MG/ML IJ SOLN: 10.0000 mg | INTRAMUSCULAR | Status: DC | PRN

## 2018-05-11 MED ORDER — LABETALOL HCL 200 MG PO TABS
200.0000 mg | ORAL_TABLET | Freq: Two times a day (BID) | ORAL | Status: DC
  Administered 2018-05-11 – 2018-05-12 (×3): 200 mg via ORAL
  Filled 2018-05-11 (×3): qty 1

## 2018-05-11 MED ORDER — CALCIUM CARBONATE ANTACID 500 MG PO CHEW: 1.0000 | CHEWABLE_TABLET | Freq: Two times a day (BID) | ORAL | Status: DC | PRN

## 2018-05-11 MED ORDER — LABETALOL HCL 5 MG/ML IV SOLN: 20.0000 mg | INTRAVENOUS | Status: DC | PRN

## 2018-05-11 MED ORDER — LABETALOL HCL 5 MG/ML IV SOLN: 80.0000 mg | INTRAVENOUS | Status: DC | PRN

## 2018-05-11 MED ORDER — MAGNESIUM SULFATE 40 G IN LACTATED RINGERS - SIMPLE
2.0000 g/h | INTRAVENOUS | Status: AC
  Administered 2018-05-11 (×2): 2 g/h via INTRAVENOUS
  Filled 2018-05-11 (×2): qty 500

## 2018-05-11 MED ORDER — DOCUSATE SODIUM 100 MG PO CAPS
100.0000 mg | ORAL_CAPSULE | Freq: Two times a day (BID) | ORAL | Status: DC
  Administered 2018-05-12: 100 mg via ORAL
  Filled 2018-05-11 (×2): qty 1

## 2018-05-11 MED ORDER — FERROUS SULFATE 325 (65 FE) MG PO TABS
325.0000 mg | ORAL_TABLET | Freq: Two times a day (BID) | ORAL | Status: DC
  Administered 2018-05-11 – 2018-05-12 (×3): 325 mg via ORAL
  Filled 2018-05-11 (×6): qty 1

## 2018-05-11 MED ORDER — DOCUSATE SODIUM 50 MG/5ML PO LIQD
100.0000 mg | Freq: Two times a day (BID) | ORAL | Status: DC
  Administered 2018-05-11: 100 mg via ORAL
  Filled 2018-05-11 (×3): qty 10

## 2018-05-11 MED ORDER — LACTATED RINGERS IV SOLN
INTRAVENOUS | Status: DC
  Administered 2018-05-11 (×3): via INTRAVENOUS

## 2018-05-11 MED ORDER — PRENATAL MULTIVITAMIN CH
1.0000 | ORAL_TABLET | Freq: Every day | ORAL | Status: DC
  Administered 2018-05-11: 1 via ORAL
  Filled 2018-05-11 (×2): qty 1

## 2018-05-11 MED ORDER — ACETAMINOPHEN 500 MG PO TABS
1000.0000 mg | ORAL_TABLET | Freq: Four times a day (QID) | ORAL | Status: DC | PRN
  Administered 2018-05-11 – 2018-05-12 (×3): 1000 mg via ORAL
  Filled 2018-05-11 (×3): qty 2

## 2018-05-11 MED ORDER — OXYCODONE HCL 5 MG PO TABS
5.0000 mg | ORAL_TABLET | ORAL | Status: DC | PRN
  Administered 2018-05-11 – 2018-05-12 (×2): 5 mg via ORAL
  Filled 2018-05-11 (×2): qty 1

## 2018-05-11 MED ORDER — LABETALOL HCL 5 MG/ML IV SOLN: 40.0000 mg | INTRAVENOUS | Status: DC | PRN

## 2018-05-11 MED ORDER — MAGNESIUM SULFATE BOLUS VIA INFUSION
4.0000 g | Freq: Once | INTRAVENOUS | Status: AC
  Administered 2018-05-11: 4 g via INTRAVENOUS
  Filled 2018-05-11: qty 500

## 2018-05-11 MED ORDER — NIFEDIPINE ER OSMOTIC RELEASE 30 MG PO TB24: 30.0000 mg | ORAL_TABLET | Freq: Every day | ORAL | Status: DC

## 2018-05-11 NOTE — Progress Notes (Signed)
Dr Richardson Dopp texted about pt requesting to see a MD.

## 2018-05-11 NOTE — Lactation Note (Signed)
Lactation Consultation Note  Patient Name: Samantha SchaumannYolanda M Brewer ZOXWR'UToday's Date: 05/11/2018    [facility]  RN request for lactation support.    Mother is a readmit for observation and magnesium sulfate.  Mother does not want to use her breast milk to feed baby due to the medications she is currently on with this hospital admission.  She is concerned with the magnesium sulfate and the labetalol.  I reassured mother that both of these medications are safe to use when breast feeding.  I explained about our reference source, Bobbye Mortonhomas Hale, that we use as our guideline.  I also reassured her that her doctors should be aware of any possible interactions between medications they prescribe and breast feeding safety.  Mother commented, "My doctors SHOULD have done many other things that they didn't do."  She has a very strong dislike with the way her hospital course and doctors have treated her. She stated that the doctors are "constantly changing things."   She seems hesitant to trust that these medications are safe.  I explained that, if she starts on a new or different medication, any LC would be happy to verify the safety of her medications in relation to breast milk.  She seemed satisfied with this option.  If she would like our services I explained that she can contact her staff RN and they will call for our assistance.  I suggested mother reconsider "throwing out" her breast milk and to let her RN know if she decides to continue using her milk.  Mother verbalized understanding and had no further questions.  Visitors in room.                    Dahna Hattabaugh R Barnett Elzey 05/11/2018, 3:39 PM

## 2018-05-11 NOTE — Progress Notes (Signed)
Post Partum Day 5 - Re- admission for post partum preeclampsia with severe features Patient admitted from MAU with severe range blood pressures at home and a severe headache.   Subjective: Patient seen at bedside. She c/o severe HA 7/10.  She notes it is "throbbing".  She denies blurry vision, no scotomata,  No RUQ pain.  She denies chest pain or shortness of breath.    Objective: Blood pressure 139/81, pulse 97, temperature 98.1 F (36.7 C), temperature source Oral, resp. rate 16, height 5\' 9"  (1.753 m), weight 109.3 kg, last menstrual period 08/25/2017, SpO2 98 %, unknown if currently breastfeeding.  Physical Exam:  General: alert, cooperative and mild distress Lochia: appropriate Uterine Fundus: firm Incision: healing well DVT Evaluation: No evidence of DVT seen on physical exam. Negative Homan's sign. No cords or calf tenderness.  Recent Labs    05/10/18 2315  HGB 10.4*  HCT 32.6*    Assessment/Plan: Patient with post partum preeclampsia with severe features Re-Admitted for observation and 24 hours of magnesium sulfate for seizure prophylaxis PRN tylenol for her headache.  Pre E labs within normal limits She is currently taking Nifedipine 30 mg XL and BPs are mild range.    LOS: 0 days   Kaiser Belluomini STACIA 05/11/2018, 3:17 AM

## 2018-05-11 NOTE — Progress Notes (Addendum)
Pt c/o right lower sided abdominal pain. +bowel sounds. Pt requesting to see a physician. Patient given stool softener per order. [facility] CNM notified of patient request. Lesly Rubenstein states she will page Dr. Richardson Dopp. Will continue to monitor. Carmelina Dane, RN

## 2018-05-11 NOTE — H&P (Addendum)
[name] is an 33 y.o. female POD #5 from Cesarean section, presenting to [facility]U for elevated BP at home in the severe range x 1 with a headache.    Past Medical History:  Diagnosis Date  . Asthma   . Complication of anesthesia    trouble waking up  . Diabetes mellitus without complication ([place])   . Gestational diabetes    insulin  . HSV (herpes simplex virus) anogenital infection   . Hypertension   . No pertinent past medical history   . Ovarian cyst   . Pregnancy induced hypertension    procardia    Past Surgical History:  Procedure Laterality Date  . BACK SURGERY    . CESAREAN SECTION N/A 05/06/2018   Procedure: CESAREAN SECTION;  Surgeon: Christophe Louis, MD;  Location: [place];  Service: Obstetrics;  Laterality: N/A;  . DILATION AND CURETTAGE OF UTERUS    . INDUCED ABORTION     x2  . laporocscopy      Family History  Problem Relation Age of Onset  . Hypertension Mother   . Asthma Mother   . Hypertension Father   . Heart disease Father   . Hypertension Sister   . Diabetes Paternal Grandmother   . Stroke Paternal Grandmother   . Kidney disease Paternal Grandmother   . COPD Paternal Grandfather   . Anesthesia problems Neg Hx   . Hypotension Neg Hx   . Malignant hyperthermia Neg Hx   . Pseudochol deficiency Neg Hx     Social History:  reports that she quit smoking about 2 years ago. She smoked 0.50 packs per day. She has never used smokeless tobacco. She reports that she does not drink alcohol or use drugs.  Allergies:  Allergies  Allergen Reactions  . Latex Itching  . Morphine And Related Itching    Lower doses OK  . Penicillins Rash and Other (See Comments)    Childhood reaction ( unknown) Has patient had a PCN reaction causing immediate rash, facial/tongue/throat swelling, SOB or lightheadedness with hypotension: Yes Has patient had a PCN reaction causing severe rash involving mucus membranes or skin necrosis: No Has patient had a PCN reaction  that required hospitalization: No Has patient had a PCN reaction occurring within the last 10 years: No If all of the above answers are "NO", then may proceed with Cephalosporin use.     Medications Prior to Admission  Medication Sig Dispense Refill Last Dose  . acetaminophen (TYLENOL) 325 MG tablet Take 2 tablets (650 mg total) by mouth every 6 (six) hours as needed for moderate pain (Take 1-2 tablets every six hours as needed for pain). 30 tablet 0 Past Week at Unknown time  . albuterol (PROVENTIL HFA;VENTOLIN HFA) 108 (90 Base) MCG/ACT inhaler Inhale 1-2 puffs into the lungs every 6 (six) hours as needed for wheezing or shortness of breath.   Rescue  . Alcohol Swabs PADS 1 application by Does not apply route 4 (four) times daily. 100 each 5 Taking  . blood glucose meter kit and supplies KIT Dispense based on patient and insurance preference. Use up to four times daily as directed. (FOR ICD-9 250.00, 250.01). 1 each 0 Taking  . butalbital-acetaminophen-caffeine (FIORICET, ESGIC) 50-325-40 MG tablet Take 1 tablet by mouth every 6 (six) hours as needed for headache. 30 tablet 0 05/03/2018 at Unknown time  . calcium carbonate (TUMS - DOSED IN MG ELEMENTAL CALCIUM) 500 MG chewable tablet Chew 1 tablet by mouth 2 (two) times daily  as needed for indigestion or heartburn.   Past Week at Unknown time  . insulin lispro (HUMALOG) 100 UNIT/ML injection Inject 0.16 mLs (16 Units total) into the skin daily with supper. (Patient taking differently: Inject 12-15 Units into the skin 3 (three) times daily with meals. As needed when blood sugar is above 125. Patient uses between 12-15 units per sliding scale.) 10 mL 5 Past Week at Unknown time  . insulin NPH Human (HUMULIN N,NOVOLIN N) 100 UNIT/ML injection Inject 0.1 mLs (10 Units total) into the skin daily before breakfast. (Patient taking differently: Inject 16-24 Units into the skin 2 (two) times daily at 8 am and 10 pm. Patient injects 16 units in the morning  and 24 units at bedtime.) 10 mL 5 05/02/2018 at Unknown time  . Insulin Syringe-Needle U-100 (SAFETY INSULIN SYRINGES) 30G X 1/2" 1 ML MISC 1 Syringe by Does not apply route 4 (four) times daily. 100 each 5 Taking  . Lancets (ONETOUCH ULTRASOFT) lancets Use as instructed 100 each 5 Taking  . magnesium gluconate (MAGONATE) 500 MG tablet Take 500 mg by mouth daily.   More than a month at Unknown time  . NIFEdipine (PROCARDIA-XL/NIFEDICAL-XL) 30 MG 24 hr tablet Take 30 mg by mouth at bedtime.   05/04/2018 at Unknown time  . oxyCODONE (OXY IR/ROXICODONE) 5 MG immediate release tablet Take 1 tablet (5 mg total) by mouth every 4 (four) hours as needed for moderate pain. 20 tablet 0   . Prenatal Vit-Fe Fumarate-FA (PRENATAL MULTIVITAMIN) TABS tablet Take 1 tablet by mouth daily at 12 noon.   05/04/2018 at Unknown time  . valACYclovir (VALTREX) 500 MG tablet Take 500 mg by mouth daily.   05/04/2018 at Unknown time    Review of Systems  Constitutional: Negative.   HENT: Negative.   Eyes: Positive for photophobia.  Respiratory: Negative.   Cardiovascular: Positive for leg swelling.  Gastrointestinal: Negative.   Genitourinary: Negative.   Musculoskeletal: Positive for back pain.  Skin: Negative.   Neurological: Positive for headaches.  Endo/Heme/Allergies: Negative.   Psychiatric/Behavioral: Negative.     Blood pressure 135/90, pulse 100, temperature 98.9 F (37.2 C), resp. rate 18, last menstrual period 08/25/2017, unknown if currently breastfeeding.   Vitals:   05/10/18 2311 05/10/18 2316 05/10/18 2331 05/10/18 2346  BP: (!) 159/99 (!) 144/88 138/78 135/90  Pulse: (!) 116 (!) 103 94 100  Resp: 18     Temp: 98.9 F (37.2 C)       Physical Exam  Constitutional: She is oriented to person, place, and time. She appears well-developed and well-nourished.  HENT:  Head: Normocephalic and atraumatic.  Eyes: Pupils are equal, round, and reactive to light.  Neck: Normal range of motion. Neck  supple.  Cardiovascular: Normal rate and regular rhythm.  Respiratory: Effort normal and breath sounds normal.  GI: Soft.  Musculoskeletal: Normal range of motion.  Neurological: She is alert and oriented to person, place, and time.  Skin: Skin is warm and dry.  Psychiatric: She has a normal mood and affect. Her behavior is normal. Judgment and thought content normal.  Incision: C/D/I except for dime sized spot of old drainage near center of honeycomb dressing.   Results for orders placed or performed during the hospital encounter of 05/10/18 (from the past 24 hour(s))  CBC     Status: Abnormal   Collection Time: 05/10/18 11:15 PM  Result Value Ref Range   WBC 8.0 4.0 - 10.5 K/uL   RBC 3.64 (L) 3.87 -  5.11 MIL/uL   Hemoglobin 10.4 (L) 12.0 - 15.0 g/dL   HCT 32.6 (L) 36.0 - 46.0 %   MCV 89.6 80.0 - 100.0 fL   MCH 28.6 26.0 - 34.0 pg   MCHC 31.9 30.0 - 36.0 g/dL   RDW 14.4 11.5 - 15.5 %   Platelets 305 150 - 400 K/uL   nRBC 0.0 0.0 - 0.2 %  Comprehensive metabolic panel     Status: Abnormal   Collection Time: 05/10/18 11:15 PM  Result Value Ref Range   Sodium 136 135 - 145 mmol/L   Potassium 3.6 3.5 - 5.1 mmol/L   Chloride 104 98 - 111 mmol/L   CO2 24 22 - 32 mmol/L   Glucose, Bld 87 70 - 99 mg/dL   BUN 8 6 - 20 mg/dL   Creatinine, Ser 0.74 0.44 - 1.00 mg/dL   Calcium 8.9 8.9 - 10.3 mg/dL   Total Protein 6.1 (L) 6.5 - 8.1 g/dL   Albumin 2.8 (L) 3.5 - 5.0 g/dL   AST 28 15 - 41 U/L   ALT 28 0 - 44 U/L   Alkaline Phosphatase 128 (H) 38 - 126 U/L   Total Bilirubin 0.8 0.3 - 1.2 mg/dL   GFR calc non Af Amer >60 >60 mL/min   GFR calc Af Amer >60 >60 mL/min   Anion gap 8 5 - 15    Assessment/Plan:  Preeclampsia with severe features:  Was recently treated for same using mag sulfate infusion during labor and postpartum. She was discharged home on 05/09/18 on Nifedipine XL '30mg'$  at HS. Denies visual changes and epigastric pain. Endorses headache with photophobia 5/10 at present,  sometimes as bad as 7/10. +3 edema to BLE. B/P normal to mild range here, highest being 159/99. CMP & CBC WNL. PCR pending.   Consulted with Dr. Alwyn Pea and plan is to admit for 12-24h of magnesium infusion 4gm on HROB unit. Discussed with patient and patient agrees to plan. She has her infant Greyson with her and her significant other is coming to care for the baby.  POD day #5 from Cesarean section: Incision healing well. Lochia appropriate. Denies fever.  No breast, abdomen, or calf tenderness. +3 edema to BLE. Crunching ice heavily x 3 days. Ordered ferrous sulfate '325mg'$  bid. Hgb 10.4.  Altha Harm 05/11/2018, 12:26 AM

## 2018-05-11 NOTE — Progress Notes (Signed)
Post Partum POD# Day 5 - Re- admission for Re-Admitted for observation and 24 hours of magnesium sulfate for seizure prophylaxis. Patient admitted from MAU with severe range blood pressures at home and a severe headache. Pt had had an IOL for preeclampsia with SF then had a primary CS on 05/06/2018 for failure to progress. Pt also had GDMA2 controlled on insulin during pregnancy. During her PP stay pt was non compliment with meds, CBG, pt was and is aware of of risk and benefits of taking meds, and blood sugar checks.   Subjective: Patient called RN and requested to speak with an MD, about abdominal pain, I saw the pt in bed holding the infant and explained to her I was the midwife on call for 24 hours pt verbally stated she was ok with me evaluating her again, Camelia Eng RN was present for conversation about pt c/o right lower abdominal pain superior to incision, pt took at stool softener and endorses felling better having having a normal bowel movement, Pt abd was soft and non-tender to palpate on exam, pt is afebrile and stable, incision appears normal flesh color and absent of erythema or edema, no drainage was noted, honey comb in place and CDI, I then asked th ept if her HA was still there and she stated it was not at this time but it has been coming and going, pt endorses [place] for [place] chronic pain pain but denies ever being dx with migraine, pt was at risk for a spinal HA from spinal epidural, but pt endorse sHA is gone now and denies when she did have noting if it was better or worse with lying down. Pt stable in room, pt was frustated by how her providers change ever shift and sometimes the plan for her changes based on the provider, I reviewed the same plan I reviewed with her at 0800 again and pt verbalized understanding the plan and denies any further questions. BF is currently in room with the pt.   Objective: Blood pressure (!) 149/96, pulse 99, temperature 98.5 F (36.9 C),  temperature source Oral, resp. rate 16, height 5\' 9"  (1.753 m), weight 109.3 kg, last menstrual period 08/25/2017, SpO2 95 %, unknown if currently breastfeeding.  Patient Vitals for the past 24 hrs:  BP Temp Temp src Pulse Resp SpO2 Height Weight  05/11/18 1141 (!) 149/96 98.5 F (36.9 C) Oral 99 16 95 % - -  05/11/18 0900 (!) 146/82 - - 93 16 - - -  05/11/18 0750 (!) 145/91 98.1 F (36.7 C) Oral 85 16 97 % - -  05/11/18 0700 134/83 - - 85 16 - - -  05/11/18 0600 (!) 147/82 - - 87 16 - - -  05/11/18 0500 (!) 152/103 - - 100 16 - - -  05/11/18 0400 (!) 142/87 - - 90 14 - - -  05/11/18 0252 139/81 - - 97 - - - -  05/11/18 0249 (!) 156/97 - - (!) 107 16 - - -  05/11/18 0240 135/87 - - 90 16 98 % - -  05/11/18 0217 (!) 157/94 98.1 F (36.7 C) Oral 94 16 100 % - -  05/11/18 0125 (!) 144/94 97.9 F (36.6 C) Oral (!) 109 19 97 % 5\' 9"  (1.753 m) 109.3 kg  05/10/18 2346 135/90 - - 100 - - - -  05/10/18 2331 138/78 - - 94 - - - -  05/10/18 2316 (!) 144/88 - - (!) 103 - - - -  05/10/18 2311 (!) 159/99 98.9 F (37.2 C) - (!) 116 18 - - -    Physical Exam:  General: alert, cooperative and mild distress Lochia: appropriate Uterine Fundus: firm, soft abdomen and non-tender to palpate. +bowel sounds in all quadrents Incision: healing well, no erythema, no drainage noted, no edema, incision approximate, honey comb dressing CDI. DVT Evaluation: No evidence of DVT seen on physical exam. Negative Homan's sign. No cords or calf tenderness. 2+ pitting edema to bi-lateral lower extremities. 2+ Patellar DRT, no clonus.   Recent Labs    05/10/18 2315  HGB 10.4*  HCT 32.6*    Intake/Output Summary (Last 24 hours) at 05/11/2018 1515 Last data filed at 05/11/2018 1411 Gross per 24 hour  Intake 892.44 ml  Output 3700 ml  Net -2807.56 ml    Assessment: [name], 33 yo G3P1 whom was Re-Admitted for observation and 24 hours of magnesium sulfate for seizure prophylaxis. Per Dr Pilar GrammesPinns  recommendations, currently being treated for a second time with magnesium with negative Preeclampsia labs. Pt had started procardia 30mg  XL post discharge and had elevated to severe range reported by her at home, pt had elevated BP in MAU, none of which were elevated to severe range (>160/110), highest BP was 159/99.  BP Range for last 24 hours: Temp:  [97.9 F (36.6 C)-98.9 F (37.2 C)] 98.5 F (36.9 C) (01/01 1141) Pulse Rate:  [85-116] 99 (01/01 1141) Resp:  [14-19] 16 (01/01 1141) BP: (134-159)/(78-103) 149/96 (01/01 1141) SpO2:  [95 %-100 %] 95 % (01/01 1141) Weight:  [109.3 kg] 109.3 kg (01/01 0125)  Current BP: 149/96  Beside HA which pt endorses having them frequently even during pregnancy pt is asymptomatic. Pt was also at risk for spinal HA from epidural but doesn't not appear to be related. See subjected noted for further annotation. I am not under the impression that this pt has PP preeclampsia, pt had preeclampsia with SF with PCR of 0.31 during pregnancy and was tx PP with magnesium 24 hours, all pt preeclampsia labs for this admission were negative and normal as followed on 01/01 @ 0000: PCR 0.18 lower than prelabor, HBG 10.4 no change since Post-op, Platelets 305 uncchaged, creatinine 0.74, AST 28, ALT 28. Pt had HA with elevated Bps in MAU. There I am dx with pt with postpartum hypertension. Pt refused to take meds for HA, when she did take tylenol @ 0330, pt was found to be sleeping @0800  on 01/01, but woken for assessment and pt stated she still had a HA. Pt non compliment with hospital rules and regulation. Pt was dx with GDMA@ during pregnancy which was controlled with insulin, pt refused to allow RN to take CBG or administer insulin during PP period, pt denies meds or monitoring for glucose. Pt is at increased risk for developing HTN due to GDM, obesity, and preeclampsia.   Plan: 1) Postpartum hypertension:  Per Dr Bing Reeoles recommendation if pt continues to have HA pt is to be on  24 hour magnesium, since pt continues to have HA pt will remain on Magnesium and it is to be turned off at 0230 on 05/12/2017. RN to continue to monitor BP, nuero checks and I&O. Continue labetalol 200mg  BID per Dr Bing Reeoles recommendations. I did speak with the pt about risk and benefits of taking meds while breastfeeding per pt request, pt verbalized understanding and verbally consented to taking labetalol 200mg  BID PO. I further recommend we consider putting the pt on 25mg  HCTZ daily to help  with fluid overload, pt has 2+ pitting edema, although pt output in 34800mls/hr and appears to be diuresing on her own.    2) HA: Pt to take tylenol 1000mg  PO PRN.  3) POD#5 Incisional pain: May take oxycodone IR 5mg  Po as needed. Gentle heat on incision, colace 100mg  BID, increase fluids.   4) GDMA2: Pt declines CBG checks , insulin and glycemic control meds, pt aware of risk and benifits.   Dr Richardson Doppole aware of pt status.    LOS: 0 days   [facility] Akshath Mccarey NP-C, CNM 05/11/2018, 3:15 PM

## 2018-05-11 NOTE — Progress Notes (Signed)
Dr Richardson Dopp endorses it was ok for me to evaluate the pt and report back to her.

## 2018-05-11 NOTE — Progress Notes (Signed)
Post Partum POD# Day 5 - Re- admission for Re-Admitted for observation and 24 hours of magnesium sulfate for seizure prophylaxis. Patient admitted from [facility]U with severe range blood pressures at home and a severe headache. Pt had had an IOL for preeclampsia with SF then had a primary CS on 05/06/2018 for failure to progress. Pt also had GDMA2 controlled on insulin during pregnancy. During her PP stay pt was non compliment with meds, CBG, pt was and is aware of of risk and benefits of taking meds, and blood sugar checks.   Subjective: Patient seen at bedside. She c/o continue throbbing HA despite taking tylenol of 1000mg  at 0330. Pt was noted to be asleep when I entered the room with baby in arms, pt was informed by RN to not sleep with the baby in her arms, pt verbalized she was aware, but continue to do so. She denies blurry vision, no scotomata, No RUQ pain.  She denies chest pain or shortness of breath. Pt was informed by RN that she needs to have someone in her room with her to take care of the baby in case of emergency, pt continues to be non compliment with hospital protocols.   Objective: Blood pressure (!) 145/91, pulse 85, temperature 98.1 F (36.7 C), temperature source Oral, resp. rate 16, height 5\' 9"  (1.753 m), weight 109.3 kg, last menstrual period 08/25/2017, SpO2 97 %, unknown if currently breastfeeding.  Patient Vitals for the past 24 hrs:  BP Temp Temp src Pulse Resp SpO2 Height Weight  05/11/18 0750 (!) 145/91 98.1 F (36.7 C) Oral 85 16 97 % - -  05/11/18 0700 134/83 - - 85 16 - - -  05/11/18 0600 (!) 147/82 - - 87 16 - - -  05/11/18 0500 (!) 152/103 - - 100 16 - - -  05/11/18 0400 (!) 142/87 - - 90 14 - - -  05/11/18 0252 139/81 - - 97 - - - -  05/11/18 0249 (!) 156/97 - - (!) 107 16 - - -  05/11/18 0240 135/87 - - 90 16 98 % - -  05/11/18 0217 (!) 157/94 98.1 F (36.7 C) Oral 94 16 100 % - -  05/11/18 0125 (!) 144/94 97.9 F (36.6 C) Oral (!) 109 19 97 % 5\' 9"  (1.753 m)  109.3 kg  05/10/18 2346 135/90 - - 100 - - - -  05/10/18 2331 138/78 - - 94 - - - -  05/10/18 2316 (!) 144/88 - - (!) 103 - - - -  05/10/18 2311 (!) 159/99 98.9 F (37.2 C) - (!) 116 18 - - -    Physical Exam:  General: alert, cooperative and mild distress Lochia: appropriate Uterine Fundus: firm Incision: healing well DVT Evaluation: No evidence of DVT seen on physical exam. Negative Homan's sign. No cords or calf tenderness. 2+ pitting edema to bi-lateral lower extremities. 2+ Patellar DRT, no clonus.   Recent Labs    05/10/18 2315  HGB 10.4*  HCT 32.6*    Intake/Output Summary (Last 24 hours) at 05/11/2018 0915 Last data filed at 05/11/2018 0750 Gross per 24 hour  Intake 392.44 ml  Output 2000 ml  Net -1607.56 ml    Assessment: [name], 33 yo G3P1 whom was Re-Admitted for observation and 24 hours of magnesium sulfate for seizure prophylaxis. Per Dr Pilar GrammesPinns recommendations, currently being treated for a second time with magnesium with negative Preeclampsia labs. Pt had started procardia 30mg  XL post discharge and had elevated to severe  range reported by her at home, pt had elevated BP in MAU, none of which were elevated to severe range (>160/110), highest BP was 159/99.  BP Range for last 24 hours: Temp:  [97.9 F (36.6 C)-98.9 F (37.2 C)] 98.1 F (36.7 C) (01/01 0750) Pulse Rate:  [85-116] 85 (01/01 0750) Resp:  [14-19] 16 (01/01 0750) BP: (134-159)/(78-103) 145/91 (01/01 0750) SpO2:  [97 %-100 %] 97 % (01/01 0750) Weight:  [109.3 kg] 109.3 kg (01/01 0125)  Current BP: 145/91  Beside HA which pt endorses having them frequently even during pregnancy pt is asymptomatic. I am not under the impression that this pt has PP preeclampsia, pt had preeclampsia with SF with PCR of 0.31 during pregnancy and was tx PP with magnesium 24 hours, all pt preeclampsia labs for this admission were negative and normal as followed: PCR 0.18 lower than prelabor, HBG 10.4 no change  since Post-op, Platelets 305 uncchaged, creatinine 0.74, AST 28, ALT 28. Pt had HA with elevated Bps in MAU. There I am dx with pt with postpartum hypertension. Pt refused to take meds for HA, when she did take tylenol, pt was found to be sleeping but woken for assessment and pt stated she still had a HA. Pt non compliment with hospital rules and regulation. Pt was dx with GDMA@ during pregnancy which was controlled with insulin, pt refused to allow RN to take CBG or administer insulin during PP period, pt denies meds or monitoring for glucose. Pt is at increased risk for developing HTN due to GDM, obesity, and preeclampsia.   Plan: 1) Postpartum hypertension: Placed admission order in place of 24 hours OBS. Per Dr Bing Reeoles recommendation if pt continues to have HA pt is to be on 24 hour magnesium, since pt continues to have HA pt will remain on Magnesium and it is to be turned off at 0230 on 05/12/2017. RN to continue to monitor BP, nuero checks and I&O. Discontinue procardia while on magnesium, started labetalol 200mg  BID per Dr Bing Reeoles recommendations. I did speak with the pt about risk and benefits of taking meds while breastfeeding per pt request, pt verbalized understanding and verbally consented to taking labetalol 200mg  BID PO. I further recommend we consider putting the pt on 25mg  HCTZ daily to help with fluid overload, pt has 2+ pitting edema, although pt output in 27550mls/hr and appears to be starting to diurese on own.    2) HA: Pt to take tylenol 1000mg  PO PRN.  3) POD#5 Incisional pain: May take oxycodone IR 5mg  Po as needed.   4) GDMA2: Pt declines CBG checks , insulin and glycemic control meds, pt aware of risk and benifits.    LOS: 0 days   [facility] Texas[facility] Camryn Quesinberry NP-C, CNM 05/11/2018, 8:23 AM

## 2018-05-12 DIAGNOSIS — O135 Gestational [pregnancy-induced] hypertension without significant proteinuria, complicating the puerperium: Secondary | ICD-10-CM | POA: Diagnosis not present

## 2018-05-12 MED ORDER — ACETAMINOPHEN 325 MG PO TABS: 650.0000 mg | ORAL_TABLET | Freq: Four times a day (QID) | ORAL | Status: DC | PRN

## 2018-05-12 MED ORDER — BUTALBITAL-APAP-CAFFEINE 50-325-40 MG PO TABS
1.0000 | ORAL_TABLET | ORAL | Status: DC | PRN
  Filled 2018-05-12: qty 1

## 2018-05-12 MED ORDER — LABETALOL HCL 200 MG PO TABS: 200.0000 mg | ORAL_TABLET | Freq: Two times a day (BID) | ORAL | 1 refills | Status: DC

## 2018-05-12 NOTE — Discharge Summary (Signed)
OB Discharge Summary     Patient Name: [name] DOB: 16-May-1985 MRN: [id number]  Date of admission: 05/10/2018 Delivering MD: This patient has no babies on file.  Date of discharge: 05/12/2018  Admitting diagnosis: PP high BP Intrauterine pregnancy: Unknown     Secondary diagnosis:  Active Problems:   Postpartum hypertension  Additional problems: Gestational diabetes A2 in pregnancy, preeclampsia with severe features in pregnancy     Discharge diagnosis: postpartum hypertension                                                                                                Post partum procedures:Magnesium sulfate for 24hrs postpartum x2  Physical exam  Vitals:   05/12/18 0100 05/12/18 0200 05/12/18 0329 05/12/18 0725  BP:   127/71 131/85  Pulse:   86 74  Resp: 18 20 20 16   Temp:   98.8 F (37.1 C) 98.2 F (36.8 C)  TempSrc:   Oral Oral  SpO2:   98% 100%  Weight:      Height:       General: alert, cooperative and no distress Lochia: appropriate Uterine Fundus: firm Incision: Healing well with no significant drainage, No significant erythema, Dressing is clean, dry, and intact DVT Evaluation: No evidence of DVT seen on physical exam. Negative Homan's sign. No cords or calf tenderness. No significant calf/ankle edema. Preeclampsia: patient reports mild headache which is improved with medication but denies visual changes or epigastric pain. Per Dr. Estanislado Pandy, patient is stable for discharge on Labetalol 200 BID with follow up in office next week.   Labs: Lab Results  Component Value Date   WBC 8.0 05/10/2018   HGB 10.4 (L) 05/10/2018   HCT 32.6 (L) 05/10/2018   MCV 89.6 05/10/2018   PLT 305 05/10/2018   CMP Latest Ref Rng & Units 05/10/2018  Glucose 70 - 99 mg/dL 87  BUN 6 - 20 mg/dL 8  Creatinine 0.45 - 9.97 mg/dL 7.41  Sodium 423 - 953 mmol/L 136  Potassium 3.5 - 5.1 mmol/L 3.6  Chloride 98 - 111 mmol/L 104  CO2 22 - 32 mmol/L 24  Calcium 8.9 - 10.3  mg/dL 8.9  Total Protein 6.5 - 8.1 g/dL 6.1(L)  Total Bilirubin 0.3 - 1.2 mg/dL 0.8  Alkaline Phos 38 - 126 U/L 128(H)  AST 15 - 41 U/L 28  ALT 0 - 44 U/L 28    Discharge instruction: per After Visit Summary and "Baby and Me Booklet". Preeclampsia precautions discussed with patient and handout given.   After visit meds:  Allergies as of 05/12/2018      Reactions   Latex Itching   Morphine And Related Itching   Lower doses OK   Penicillins Rash, Other (See Comments)   Childhood reaction ( unknown) Has patient had a PCN reaction causing immediate rash, facial/tongue/throat swelling, SOB or lightheadedness with hypotension: Yes Has patient had a PCN reaction causing severe rash involving mucus membranes or skin necrosis: No Has patient had a PCN reaction that required hospitalization: No Has patient had a PCN reaction occurring within the last 10  years: No If all of the above answers are "NO", then may proceed with Cephalosporin use.      Medication List    STOP taking these medications   insulin lispro 100 UNIT/ML injection Commonly known as:  HUMALOG   insulin NPH Human 100 UNIT/ML injection Commonly known as:  HUMULIN N,NOVOLIN N   NIFEdipine 30 MG 24 hr tablet Commonly known as:  PROCARDIA-XL/NIFEDICAL-XL     TAKE these medications   acetaminophen 325 MG tablet Commonly known as:  TYLENOL Take 2 tablets (650 mg total) by mouth every 6 (six) hours as needed for moderate pain (Take 1-2 tablets every six hours as needed for pain).   butalbital-acetaminophen-caffeine 50-325-40 MG tablet Commonly known as:  FIORICET, ESGIC Take 1 tablet by mouth every 6 (six) hours as needed for headache.   calcium carbonate 500 MG chewable tablet Commonly known as:  TUMS - dosed in mg elemental calcium Chew 1 tablet by mouth 2 (two) times daily as needed for indigestion or heartburn.   docusate sodium 100 MG capsule Commonly known as:  COLACE Take 100 mg by mouth daily as needed for  mild constipation.   labetalol 200 MG tablet Commonly known as:  NORMODYNE Take 1 tablet (200 mg total) by mouth 2 (two) times daily.   magnesium gluconate 500 MG tablet Commonly known as:  MAGONATE Take 500 mg by mouth daily.   oxyCODONE 5 MG immediate release tablet Commonly known as:  Oxy IR/ROXICODONE Take 1 tablet (5 mg total) by mouth every 4 (four) hours as needed for moderate pain.   prenatal multivitamin Tabs tablet Take 1 tablet by mouth daily at 12 noon.   valACYclovir 500 MG tablet Commonly known as:  VALTREX Take 500 mg by mouth daily.       Diet: routine diet  Activity: Advance as tolerated. Pelvic rest for 6 weeks.   Outpatient follow ZO:XWRUEA up in 1 week for blood pressure check, baby love RN to continue to see patient outpatient, and patient to return in 6 weeks postpartum for postpartum visit. Follow up Appt:No future appointments. Follow up Visit:No follow-ups on file.  Postpartum contraception: Not Discussed  Newborn Data: This patient has no babies on file. Baby Feeding: Bottle and Breast Disposition:home with mother  05/12/2018 Janeece Riggers, CNM

## 2018-05-12 NOTE — Progress Notes (Signed)
Pt discharged home with printed instructions. Pt verbalized an understanding. No concerns noted. Brennon Otterness L Karlei Waldo, RN 

## 2018-05-12 NOTE — Discharge Instructions (Signed)
Cesarean Delivery, Care After This sheet gives you information about how to care for yourself after your procedure. Your health care provider may also give you more specific instructions. If you have problems or questions, contact your health care provider. What can I expect after the procedure? After the procedure, it is common to have:  A small amount of blood or clear fluid coming from the incision.  Some redness, swelling, and pain in your incision area.  Some abdominal pain and soreness.  Vaginal bleeding (lochia). Even though you did not have a vaginal delivery, you will still have vaginal bleeding and discharge.  Pelvic cramps.  Fatigue. You may have pain, swelling, and discomfort in the tissue between your vagina and your anus (perineum) if:  Your C-section was unplanned, and you were allowed to labor and push.  An incision was made in the area (episiotomy) or the tissue tore during attempted vaginal delivery. Follow these instructions at home: Incision care   Follow instructions from your health care provider about how to take care of your incision. Make sure you: ? Wash your hands with soap and water before you change your bandage (dressing). If soap and water are not available, use hand sanitizer. ? If you have a dressing, change it or remove it as told by your health care provider. ? Leave stitches (sutures), skin staples, skin glue, or adhesive strips in place. These skin closures may need to stay in place for 2 weeks or longer. If adhesive strip edges start to loosen and curl up, you may trim the loose edges. Do not remove adhesive strips completely unless your health care provider tells you to do that.  Check your incision area every day for signs of infection. Check for: ? More redness, swelling, or pain. ? More fluid or blood. ? Warmth. ? Pus or a bad smell.  Do not take baths, swim, or use a hot tub until your health care provider says it's okay. Ask your health  care provider if you can take showers.  When you cough or sneeze, hug a pillow. This helps with pain and decreases the chance of your incision opening up (dehiscing). Do this until your incision heals. Medicines  Take over-the-counter and prescription medicines only as told by your health care provider.  If you were prescribed an antibiotic medicine, take it as told by your health care provider. Do not stop taking the antibiotic even if you start to feel better.  Do not drive or use heavy machinery while taking prescription pain medicine. Lifestyle  Do not drink alcohol. This is especially important if you are breastfeeding or taking pain medicine.  Do not use any products that contain nicotine or tobacco, such as cigarettes, e-cigarettes, and chewing tobacco. If you need help quitting, ask your health care provider. Eating and drinking  Drink at least 8 eight-ounce glasses of water every day unless told not to by your health care provider. If you breastfeed, you may need to drink even more water.  Eat high-fiber foods every day. These foods may help prevent or relieve constipation. High-fiber foods include: ? Whole grain cereals and breads. ? Brown rice. ? Beans. ? Fresh fruits and vegetables. Activity   If possible, have someone help you care for your baby and help with household activities for at least a few days after you leave the hospital.  Return to your normal activities as told by your health care provider. Ask your health care provider what activities are safe for  you.  Rest as much as possible. Try to rest or take a nap while your baby is sleeping.  Do not lift anything that is heavier than 10 lbs (4.5 kg), or the limit that you were told, until your health care provider says that it is safe.  Talk with your health care provider about when you can engage in sexual activity. This may depend on your: ? Risk of infection. ? How fast you heal. ? Comfort and desire to  engage in sexual activity. General instructions  Do not use tampons or douches until your health care provider approves.  Wear loose, comfortable clothing and a supportive and well-fitting bra.  Keep your perineum clean and dry. Wipe from front to back when you use the toilet.  If you pass a blood clot, save it and call your health care provider to discuss. Do not flush blood clots down the toilet before you get instructions from your health care provider.  Keep all follow-up visits for you and your baby as told by your health care provider. This is important. Contact a health care provider if:  You have: ? A fever. ? Bad-smelling vaginal discharge. ? Pus or a bad smell coming from your incision. ? Difficulty or pain when urinating. ? A sudden increase or decrease in the frequency of your bowel movements. ? More redness, swelling, or pain around your incision. ? More fluid or blood coming from your incision. ? A rash. ? Nausea. ? Little or no interest in activities you used to enjoy. ? Questions about caring for yourself or your baby.  Your incision feels warm to the touch.  Your breasts turn red or become painful or hard.  You feel unusually sad or worried.  You vomit.  You pass a blood clot from your vagina.  You urinate more than usual.  You are dizzy or light-headed. Get help right away if:  You have: ? Pain that does not go away or get better with medicine. ? Chest pain. ? Difficulty breathing. ? Blurred vision or spots in your vision. ? Thoughts about hurting yourself or your baby. ? New pain in your abdomen or in one of your legs. ? A severe headache.  You faint.  You bleed from your vagina so much that you fill more than one sanitary pad in one hour. Bleeding should not be heavier than your heaviest period. Summary  After the procedure, it is common to have pain at your incision site, abdominal cramping, and slight bleeding from your vagina.  Check  your incision area every day for signs of infection.  Tell your health care provider about any unusual symptoms.  Keep all follow-up visits for you and your baby as told by your health care provider. This information is not intended to replace advice given to you by your health care provider. Make sure you discuss any questions you have with your health care provider. Document Released: 01/17/2002 Document Revised: 11/03/2017 Document Reviewed: 11/03/2017 Elsevier Interactive Patient Education  2019 [employer]   Preeclampsia and Eclampsia  Preeclampsia is a serious condition that may develop during pregnancy. It is also called toxemia of pregnancy. This condition causes high blood pressure along with other symptoms, such as swelling and headaches. These symptoms may develop as the condition gets worse. Preeclampsia may occur at 20 weeks of pregnancy or later. Diagnosing and treating preeclampsia early is very important. If not treated early, it can cause serious problems for you and your baby. One problem  it can lead to is eclampsia. Eclampsia is a condition that causes muscle jerking or shaking (convulsions or seizures) and other serious problems for the mother. During pregnancy, delivering your baby may be the best treatment for preeclampsia or eclampsia. For most women, preeclampsia and eclampsia symptoms go away after giving birth. In rare cases, a woman may develop preeclampsia after giving birth (postpartum preeclampsia). This usually occurs within 48 hours after childbirth but may occur up to 6 weeks after giving birth. What are the causes? The cause of preeclampsia is not known. What increases the risk? The following risk factors make you more likely to develop preeclampsia:  Being pregnant for the first time.  Having had preeclampsia during a past pregnancy.  Having a family history of preeclampsia.  Having high blood pressure.  Being pregnant with more than one  baby.  Being 3735 or older.  Being African-American.  Having kidney disease or diabetes.  Having medical conditions such as lupus or blood diseases.  Being very overweight (obese). What are the signs or symptoms? The earliest signs of preeclampsia are:  High blood pressure.  Increased protein in your urine. Your health care provider will check for this at every visit before you give birth (prenatal visit). Other symptoms that may develop as the condition gets worse include:  Severe headaches.  Sudden weight gain.  Swelling of the hands, face, legs, and feet.  Nausea and vomiting.  Vision problems, such as blurred or double vision.  Numbness in the face, arms, legs, and feet.  Urinating less than usual.  Dizziness.  Slurred speech.  Abdominal pain, especially upper abdominal pain.  Convulsions or seizures. How is this diagnosed? There are no screening tests for preeclampsia. Your health care provider will ask you about symptoms and check for signs of preeclampsia during your prenatal visits. You may also have tests that include:  Urine tests.  Blood tests.  Checking your blood pressure.  Monitoring your babys heart rate.  Ultrasound. How is this treated? You and your health care provider will determine the treatment approach that is best for you. Treatment may include:  Having more frequent prenatal exams to check for signs of preeclampsia, if you have an increased risk for preeclampsia.  Medicine to lower your blood pressure.  Staying in the hospital, if your condition is severe. There, treatment will focus on controlling your blood pressure and the amount of fluids in your body (fluid retention).  Taking medicine (magnesium sulfate) to prevent seizures. This may be given as an injection or through an IV.  Taking a low-dose aspirin during your pregnancy.  Delivering your baby early, if your condition gets worse. You may have your labor started with  medicine (induced), or you may have a cesarean delivery. Follow these instructions at home: Eating and drinking   Drink enough fluid to keep your urine pale yellow.  Avoid caffeine. Lifestyle  Do not use any products that contain nicotine or tobacco, such as cigarettes and e-cigarettes. If you need help quitting, ask your health care provider.  Do not use alcohol or drugs.  Avoid stress as much as possible. Rest and get plenty of sleep. General instructions  Take over-the-counter and prescription medicines only as told by your health care provider.  When lying down, lie on your left side. This keeps pressure off your major blood vessels.  When sitting or lying down, raise (elevate) your feet. Try putting some pillows underneath your lower legs.  Exercise regularly. Ask your health care provider  what kinds of exercise are best for you.  Keep all follow-up and prenatal visits as told by your health care provider. This is important. How is this prevented? There is no known way of preventing preeclampsia or eclampsia from developing. However, to lower your risk of complications and detect problems early:  Get regular prenatal care. Your health care provider may be able to diagnose and treat the condition early.  Maintain a healthy weight. Ask your health care provider for help managing weight gain during pregnancy.  Work with your health care provider to manage any long-term (chronic) health conditions you have, such as diabetes or kidney problems.  You may have tests of your blood pressure and kidney function after giving birth.  Your health care provider may have you take low-dose aspirin during your next pregnancy. Contact a health care provider if:  You have symptoms that your health care provider told you may require more treatment or monitoring, such as: ? Headaches. ? Nausea or vomiting. ? Abdominal pain. ? Dizziness. ? Light-headedness. Get help right away if:  You  have severe: ? Abdominal pain. ? Headaches that do not get better. ? Dizziness. ? Vision problems. ? Confusion. ? Nausea or vomiting.  You have any of the following: ? A seizure. ? Sudden, rapid weight gain. ? Sudden swelling in your hands, ankles, or face. ? Trouble moving any part of your body. ? Numbness in any part of your body. ? Trouble speaking. ? Abnormal bleeding.  You faint. Summary  Preeclampsia is a serious condition that may develop during pregnancy. It is also called toxemia of pregnancy.  This condition causes high blood pressure along with other symptoms, such as swelling and headaches.  Diagnosing and treating preeclampsia early is very important. If not treated early, it can cause serious problems for you and your baby.  Get help right away if you have symptoms that your health care provider told you to watch for. This information is not intended to replace advice given to you by your health care provider. Make sure you discuss any questions you have with your health care provider. Document Released: 04/24/2000 Document Revised: 04/13/2017 Document Reviewed: 12/02/2015 Elsevier Interactive Patient Education  2019 [employer]   Postpartum Baby Blues The postpartum period begins right after the birth of a baby. During this time, there is often a lot of joy and excitement. It is also a time of many changes in the life of the parents. No matter how many times a mother gives birth, each child brings new challenges to the family, including different ways of relating to one another. It is common to have feelings of excitement along with confusing changes in moods, emotions, and thoughts. You may feel happy one minute and sad or stressed the next. These feelings of sadness usually happen in the period right after you have your baby, and they go away within a week or two. This is called the "baby blues." What are the causes? There is no known cause of baby blues. It is  likely caused by a combination of factors. However, changes in hormone levels after childbirth are believed to trigger some of the symptoms. Other factors that can play a role in these mood changes include:  Lack of sleep.  Stressful life events, such as poverty, caring for a loved one, or death of a loved one.  Genetics. What are the signs or symptoms? Symptoms of this condition include:  Brief changes in mood, such as going from  extreme happiness to sadness.  Decreased concentration.  Difficulty sleeping.  Crying spells and tearfulness.  Loss of appetite.  Irritability.  Anxiety. If the symptoms of baby blues last for more than 2 weeks or become more severe, you may have postpartum depression. How is this diagnosed? This condition is diagnosed based on an evaluation of your symptoms. There are no medical or lab tests that lead to a diagnosis, but there are various questionnaires that a health care provider may use to identify women with the baby blues or postpartum depression. How is this treated? Treatment is not needed for this condition. The baby blues usually go away on their own in 1-2 weeks. Social support is often all that is needed. You will be encouraged to get adequate sleep and rest. Follow these instructions at home: Lifestyle      Get as much rest as you can. Take a nap when the baby sleeps.  Exercise regularly as told by your health care provider. Some women find yoga and walking to be helpful.  Eat a balanced and nourishing diet. This includes plenty of fruits and vegetables, whole grains, and lean proteins.  Do little things that you enjoy. Have a cup of tea, take a bubble bath, read your favorite magazine, or listen to your favorite music.  Avoid alcohol.  Ask for help with household chores, cooking, grocery shopping, or running errands. Do not try to do everything yourself. Consider hiring a postpartum doula to help. This is a professional who  specializes in providing support to new mothers.  Try not to make any major life changes during pregnancy or right after giving birth. This can add stress. General instructions  Talk to people close to you about how you are feeling. Get support from your partner, family members, friends, or other new moms. You may want to join a support group.  Find ways to cope with stress. This may include: ? Writing your thoughts and feelings in a journal. ? Spending time outside. ? Spending time with people who make you laugh.  Try to stay positive in how you think. Think about the things you are grateful for.  Take over-the-counter and prescription medicines only as told by your health care provider.  Let your health care provider know if you have any concerns.  Keep all postpartum visits as told by your health care provider. This is important. Contact a health care provider if:  Your baby blues do not go away after 2 weeks. Get help right away if:  You have thoughts of taking your own life (suicidal thoughts).  You think you may harm the baby or other people.  You see or hear things that are not there (hallucinations). Summary  After giving birth, you may feel happy one minute and sad or stressed the next. Feelings of sadness that happen right after the baby is born and go away after a week or two are called the "baby blues."  You can manage the baby blues by getting enough rest, eating a healthy diet, exercising, spending time with supportive people, and finding ways to cope with stress.  If feelings of sadness and stress last longer than 2 weeks or get in the way of caring for your baby, talk to your health care provider. This may mean you have postpartum depression. This information is not intended to replace advice given to you by your health care provider. Make sure you discuss any questions you have with your health care provider. Document Released:  01/30/2004 Document Revised:  06/23/2016 Document Reviewed: 06/23/2016 Elsevier Interactive Patient Education  [employer].

## 2018-05-12 NOTE — Lactation Note (Signed)
Lactation Consultation Note  Patient Name: [name] Date: 05/12/2018   RN called for [facility] assistance due to mom's discharge; mom requested to be seen. Mom is complaining about swelling on her left breast is swollen and, right breast feels full. LC was able to hand express out of both breast, but the left one also had some areola edema and it was more difficult to express. She has been pumping every 3 hours but noticed that the junctures in her pump were loose, LC adjusted them and let mom know that she'll feel a difference in the suction level the next time she pumps.  Gave mom ice packs and instructed her to apply them for 20 minutes prior pumping for 15 minutes. Baby is 28 days old and her milk is fully in, but only mom is a patient at this point, she was a post-partum readmit and she's going home today. She has a DEBP at home. Mom understands the need for pumping every 3 hours after feedings (she's also putting baby to the breast) but she's also aware baby may not be able to empty the breast at this point until he's all caught up developmentally.   Mom aware of LC OP services and will contact PRN.  Maternal Data    Feeding   Interventions    Lactation Tools Discussed/Used     Consult Status      Kentavius Dettore Venetia Constable 05/12/2018, 12:40 PM

## 2018-05-14 DIAGNOSIS — G894 Chronic pain syndrome: Secondary | ICD-10-CM | POA: Diagnosis not present

## 2018-05-17 DIAGNOSIS — I10 Essential (primary) hypertension: Secondary | ICD-10-CM | POA: Diagnosis not present

## 2018-07-01 DIAGNOSIS — I10 Essential (primary) hypertension: Secondary | ICD-10-CM | POA: Diagnosis not present

## 2019-03-02 IMAGING — US US MFM OB DETAIL+14 WK
1 series · 15 of 28 positions shown · non-contrast
Comparison: none

[Series 1: us mfm ob detail+14 wk · 15 of 74 slices shown]
[im 1/74]
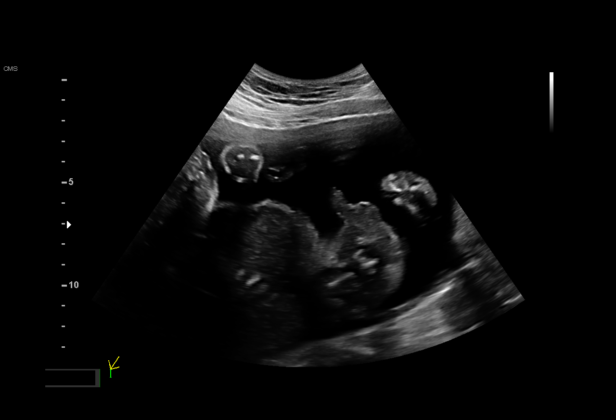
[im 6/74]
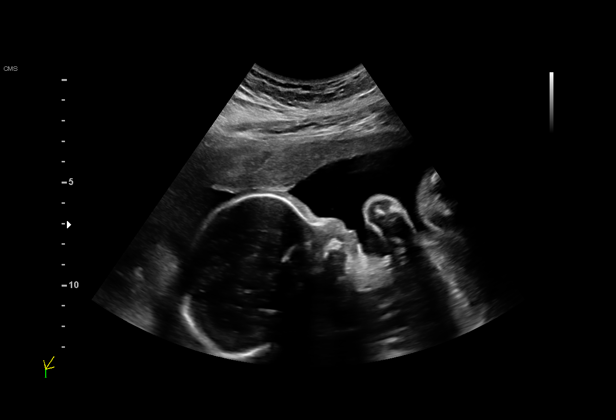
[im 11/74]
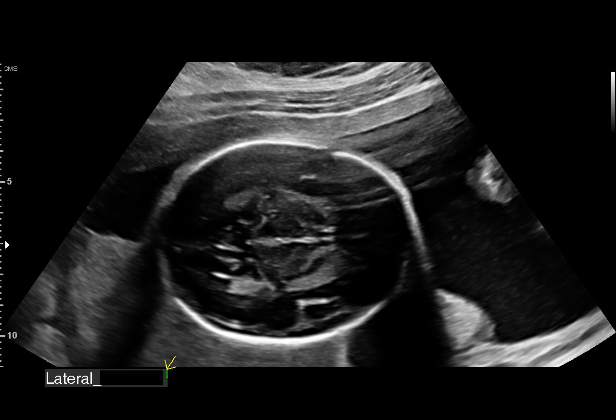
[im 17/74]
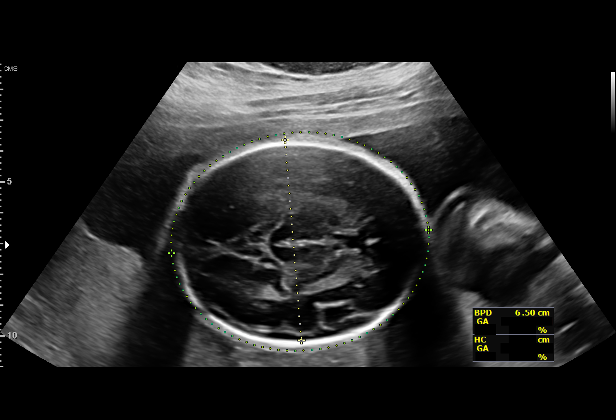
[im 22/74]
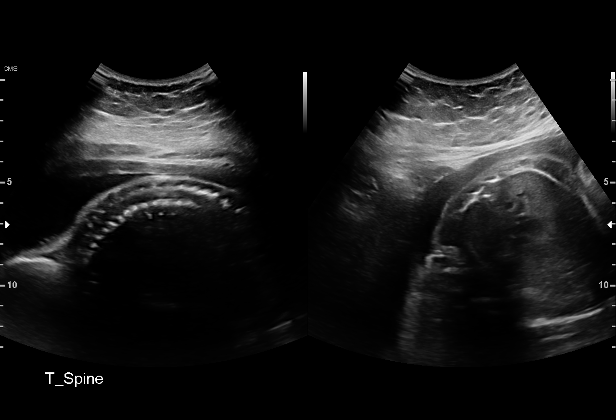
[im 28/74]
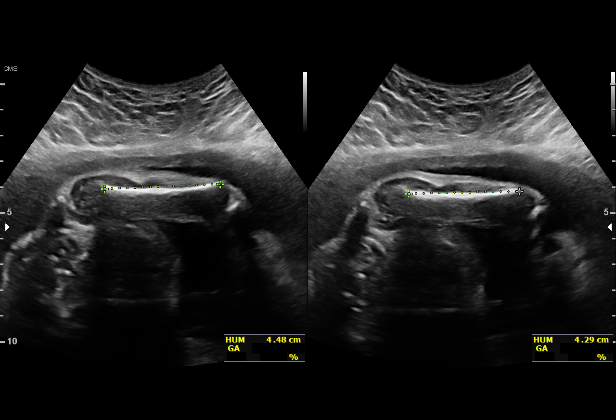
[im 33/74]
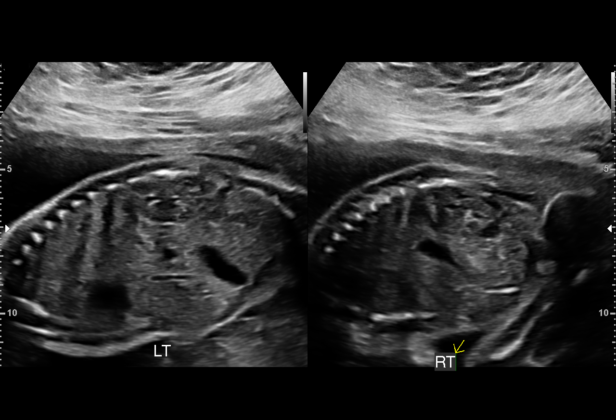
[im 38/74]
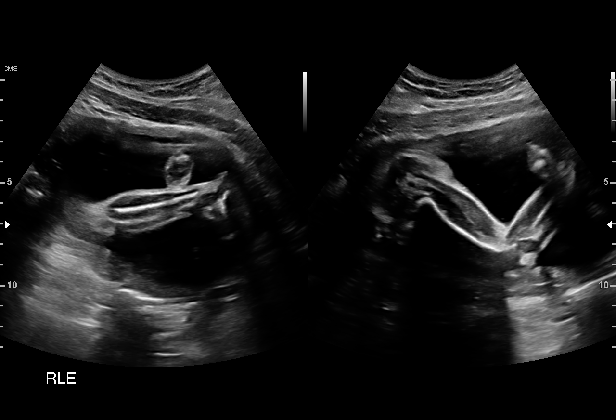
[im 41/74]
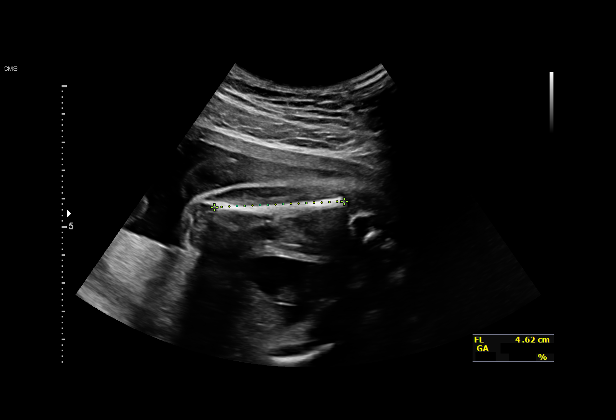
[im 46/74]
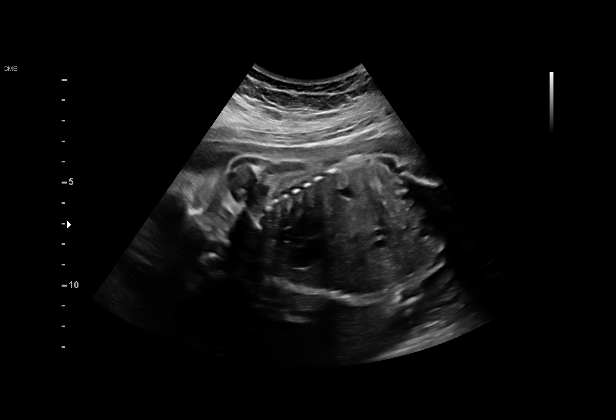
[im 52/74]
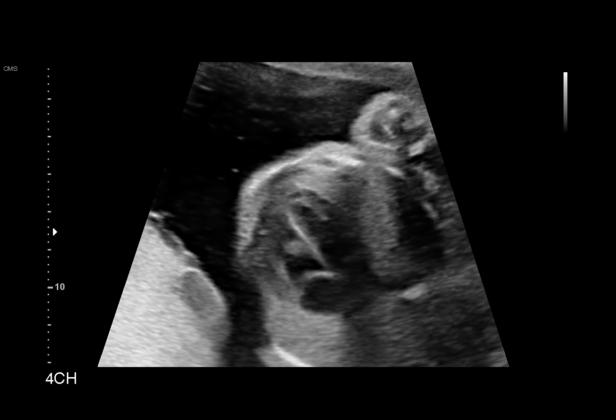
[im 57/74]
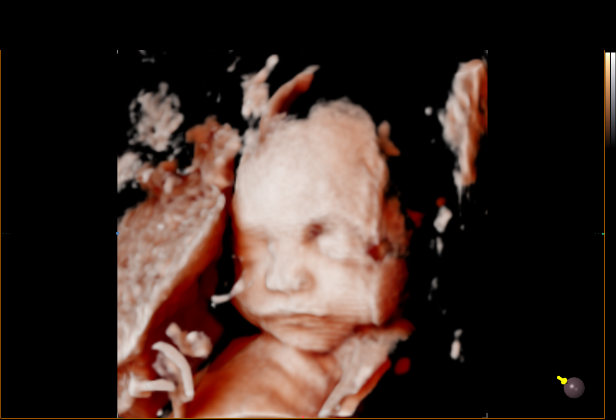
[im 63/74]
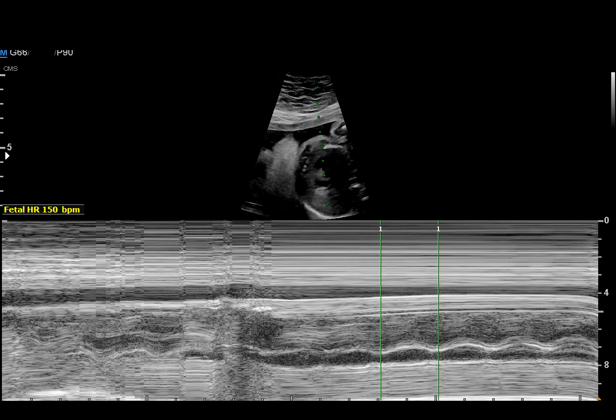
[im 68/74]
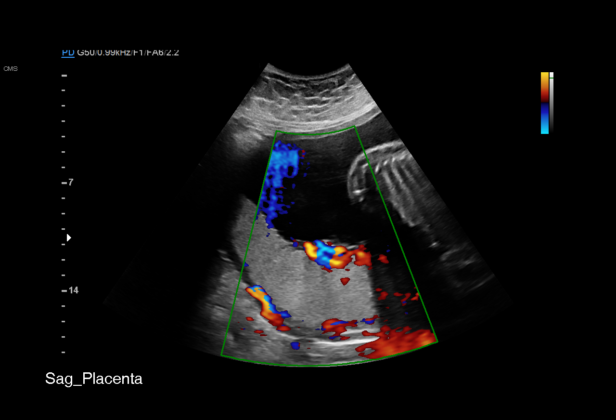
[im 74/74]
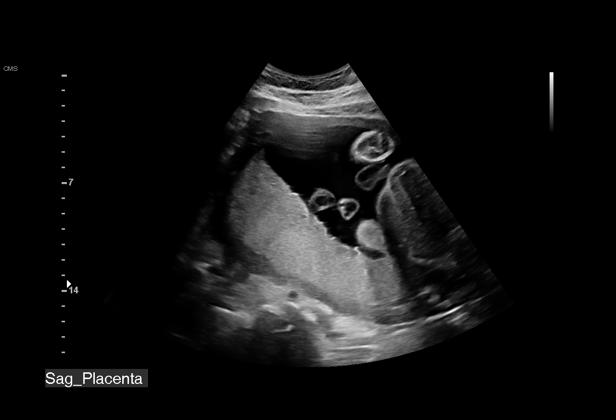

[15 of 28 positions shown; findings below may reference images not displayed]

#130

1  US MFM OB DETAIL +14 WK              76811.01     ROSAMARIA KATS

Indications

26 weeks gestation of pregnancy
Encounter for antenatal screening for
malformations
Hypertension - Gestational (procardia
prescribed-not taking)
Obesity complicating pregnancy, second
trimester (pregravid BMI 31)
Fetal Evaluation

Num Of Fetuses:         1
Fetal Heart Rate(bpm):  150
Cardiac Activity:       Observed
Presentation:           Breech
Placenta:               Posterior
P. Cord Insertion:      Visualized

Amniotic Fluid
AFI FV:      Within normal limits

Largest Pocket(cm)
7.3
Biometry

BPD:      64.7  mm     G. Age:  26w 1d         35  %    CI:        71.68   %    70 - 86
FL/HC:      19.0   %    18.6 -
HC:      243.3  mm     G. Age:  26w 3d         29  %    HC/AC:      1.06        1.04 -
AC:      229.2  mm     G. Age:  27w 2d         72  %    FL/BPD:     71.6   %    71 - 87
FL:       46.3  mm     G. Age:  25w 3d         14  %    FL/AC:      20.2   %    20 - 24
HUM:      43.9  mm     G. Age:  26w 0d         42  %
CER:      29.2  mm     G. Age:  26w 1d         45  %
CM:        6.1  mm
Est. FW:     941  gm      2 lb 1 oz     58  %
OB History

Blood Type:    0
Gravidity:    3         Term:   0        Prem:   0        SAB:   0
TOP:          2       Ectopic:  0        Living: 0
Gestational Age

LMP:           26w 2d        Date:  08/25/17                 EDD:   06/01/18
U/S Today:     26w 2d                                        EDD:   06/01/18
Best:          26w 2d     Det. By:  LMP  (08/25/17)          EDD:   06/01/18
Anatomy

Cranium:               Appears normal         LVOT:                   Appears normal
Cavum:                 Appears normal         Aortic Arch:            Appears normal
Ventricles:            Appears normal         Ductal Arch:            Appears normal
Choroid Plexus:        Appears normal         Diaphragm:              Appears normal
Cerebellum:            Appears normal         Stomach:                Appears normal, left
sided
Posterior Fossa:       Appears normal         Abdomen:                Appears normal
Nuchal Fold:           Not applicable (>20    Abdominal Wall:         Appears nml (cord
wks GA)                                        insert, abd wall)
Face:                  Appears normal         Cord Vessels:           Appears normal (3
(orbits and profile)                           vessel cord)
Lips:                  Appears normal         Kidneys:                Appear normal
Palate:                Not well visualized    Bladder:                Appears normal
Thoracic:              Appears normal         Spine:                  Appears normal
Heart:                 Appears normal         Upper Extremities:      Appears normal
(4CH, axis, and
situs)
RVOT:                  Appears normal         Lower Extremities:      Appears normal

Other:  Heels visualized. Nasal bone visualized.
Cervix Uterus Adnexa

Cervix
Not visualized (advanced GA >11wks)

Uterus
No abnormality visualized.

Left Ovary
Not visualized.

Right Ovary
Not visualized.

Cul De Sac
No free fluid seen.

Adnexa
No abnormality visualized.
Comments

Md Firug Fool is a 32 yo African American female, G 3 P
3303 LMP 08/23/17 EDC 06/01/18 now @ 26 [DATE] weeks seen
in consultation as requested secondary to:

1)       Pre-eclampsia - patient denies headaches, visual
disturbance, mid-epigastric or RUQ pain. Patient was
prescribed Nifedipine 30 mg XL which she has not yet
started.  128-147/72-87.

PREVIOUS OBSTETRICAL HISTORY:

1) 4772 - First trimester ETOP without complications 2) 6333 -
First trimester ETOP without complications

PREVIOUS GYN HISTORY:

Abnormal PAP - 2912 - colposcopy      MAURA ALICIA - neg
Chlamydia - neg
Syphilis - neg    CAT - 16 x 28-30 x 5       Contraception -
none

PREVIOUS MEDICAL HISTORY:

DM - neg            HTN - neg         Asthma -  0667
Thyroid - neg       Rheumatic Fever - neg
Heart - neg       Lung - neg        Liver - neg
Kidney - neg        Epilepsy - neg
TB - neg          Herpes - positive          UTI - neg
neg

PREVIOUS SURGICAL HISTORY:

1) 0667 - Microdiscectomy without complications 2) 5050 -
Anterior spinal fusion without complications 3) 0299 - Anterior
spinal fusion without complications

MEDICATIONS:

Prenatal Vitamins, Albuterol PRN

ALLERGIES/REACTIONS:

PCN - rash

HABITS:

Smoking - neg       Drinking - neg    Drugs - neg

PSYCHOSOCIAL:

Single; FOB is involved

PROFESSION:

[employer]

FAMILY HISTORY:

Stillborns - neg
Birth Defects - neg        Mental Retardation - neg   Blood
Dyscrasias - neg    Anesthesia Complications - delayed
waking
Impression

PRE-ECLAMPSIA/PIH:
General counseling regarding pre-eclampsia/PIH/Gestational
Hypertension was performed.  A description of the criteria for
severe pre-eclampsia was given along with its management
at this gestational age.  The role of Magnesium sulfate in
seizure prophylaxis was outlined.
Patients with mild pre-eclampsia/PIH/Gestational
Hypertension should be delivered at 37 weeks based on the
HYPITAT multicenter trial.  This trial showed that pre-
eclamptic women benefited from early intervention, without
incurring an increased risk of operative delivery or neonatal
morbidity. The trial was not large enough to determine
whether small differences in newborn outcomes or induction
between 36 and 37 weeks might be statistically significant. A
follow-up economic analysis of this trial concluded induction
was also less costly overall than expectant management with
monitoring.
EgBI7 if given, should be initiated as a 4-6 gram bolus over
20 (4 gram bolus) to 30 minutes (6 gram bolus) (a faster
therapeutic level will be obtained if a 6 gram bolus is utilized)
followed by 2-2 ? grams per hour continuous infusion.
Therapeutic Magnesium levels are 5 to 8 mg percent.  If
Magnesium levels are sub-therapeutic, a rebolus of 2 grams
over 10 minutes will often correct the Magnesium level.  If
levels are on the lower end of the therapeutic range,
increasing the hourly rate by ? gram per hour often suffices.
Magnesium levels may be checked every 6 hours if
necessary.

In order to acutely lower BP, Hydralazine IV or Labetalol IV
may be used.  Hydralazine may be given as 5-10 mg IV
increments every 15 - 20 minutes, if BP is not significantly
reduced, 20 mg IV Labetalol should be used or alternatively
started with.  IV Labetalol may be given as 20 mg then 40 mg
then 80 mg every 10 -15 minutes (80 mg being the largest
individual dose) up to a total IV dose of 300 mg.  If
necessary, a continuous IV Labetalol drip may be utilized.
Immediate release oral Nifedipine 10 mg PO followed by 20
mg every 20 minutes may also be utilized as a first line
treatment to maintain Systolic BP < 160 and/or diastolic BP <
110.  Maximum daily dose is 180 mg.  If after 50 mg PO
immediate release Nifedipine has been given and BP
remains greater than 160 for systolic BP or 110 for diastolic,
Labetalol or Hydralazine should be utilized.

IMPRESSIONS:
Recommendations

1) Baseline 24 hr. urine collection for protein, volume,
creatinine and creatinine clearance 2) Weekly PIH labs to be
done by OB 3) Weekly BPP and BP check 4) Delivery @ 37
weeks; deliver earlier if maternal or fetal deterioration 5)
Pseudocholinesterase evaluation

30 minutes spent in face-to-face consultation with greater
than 50% of the time spent in counseling.

Thank you for utilizing our ultrasound and consultative
services.  If I may be of any further service, please do not
hesitate to contact me.

## 2019-03-16 IMAGING — US US MFM FETAL BPP W/O NON-STRESS
1 series · 14 of 28 positions shown · non-contrast
Comparison: none

[Series 1: us mfm fetal bpp w/o non-stress · 35 acquisitions, 14 frames shown]
[im 2/35]
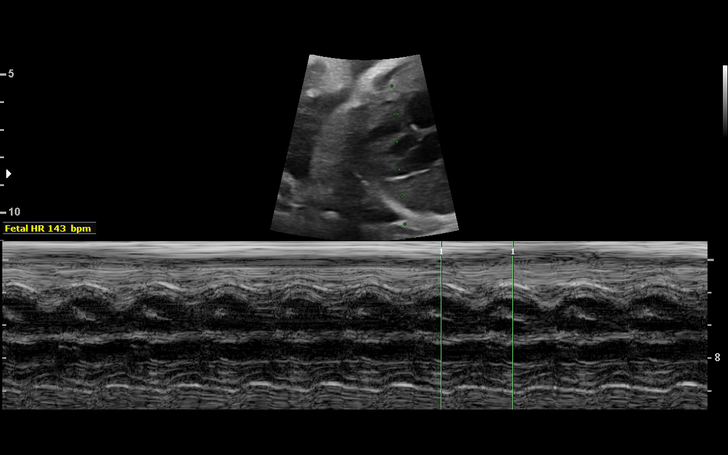
[im 4/35]
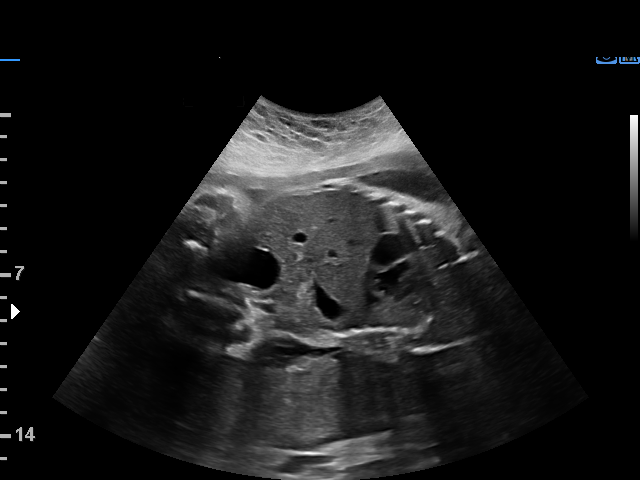
[im 7/35]
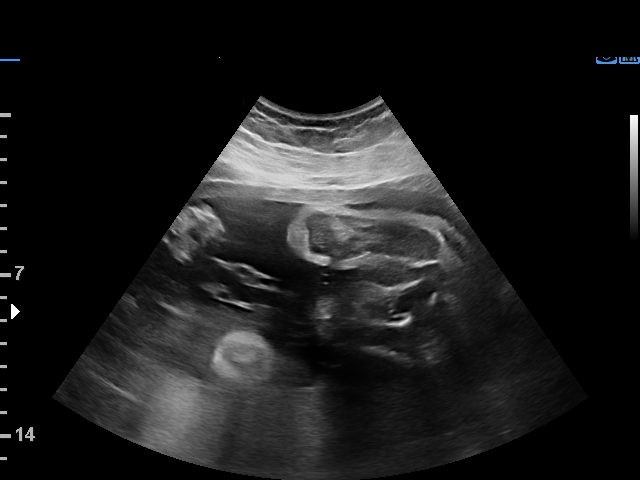
[im 9/35]
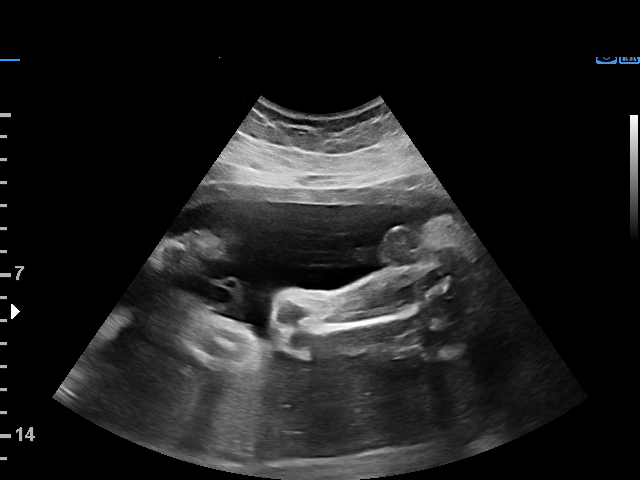
[im 12/35]
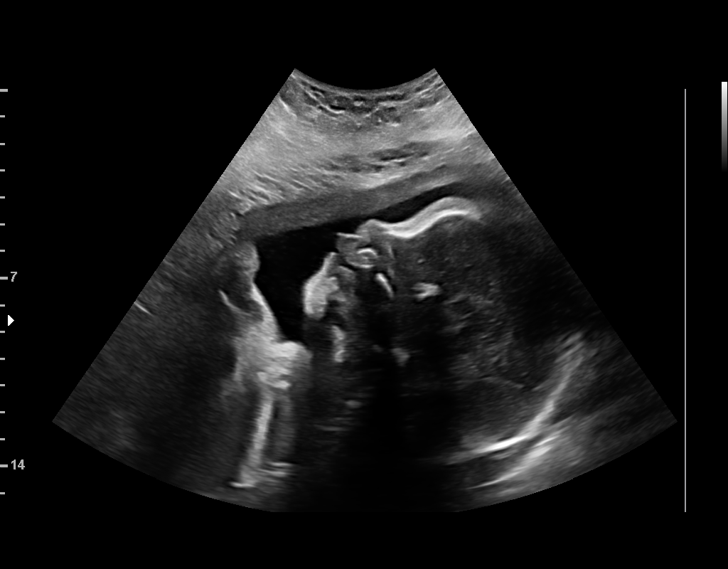
[im 14/35]
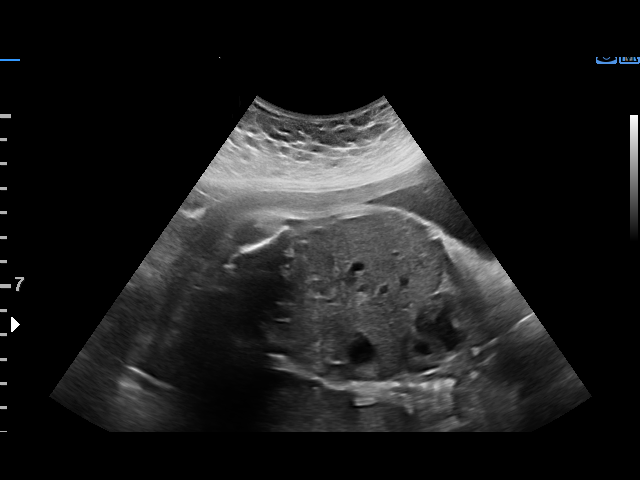
[im 17/35]
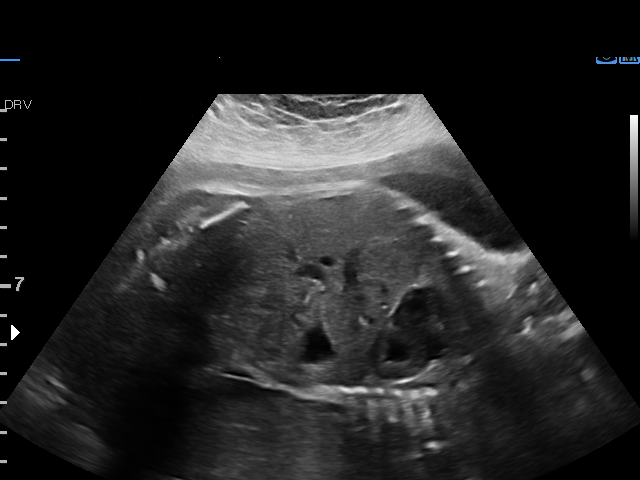
[im 19/35]
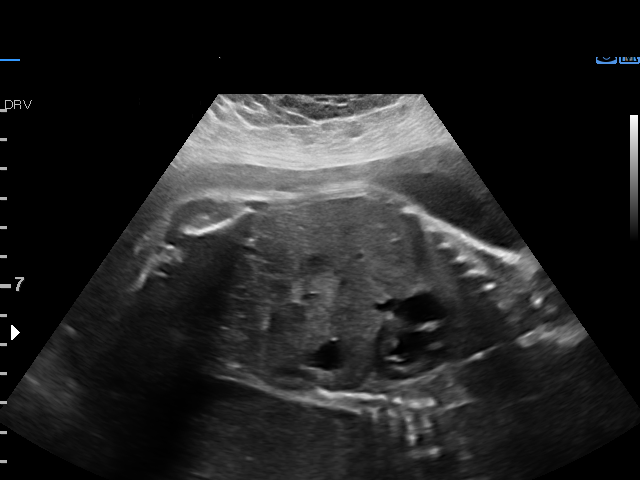
[im 22/35]
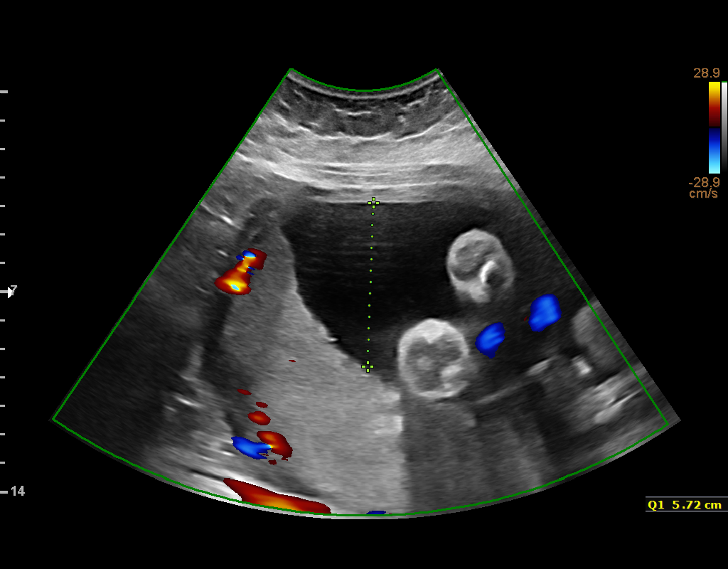
[im 24/35]
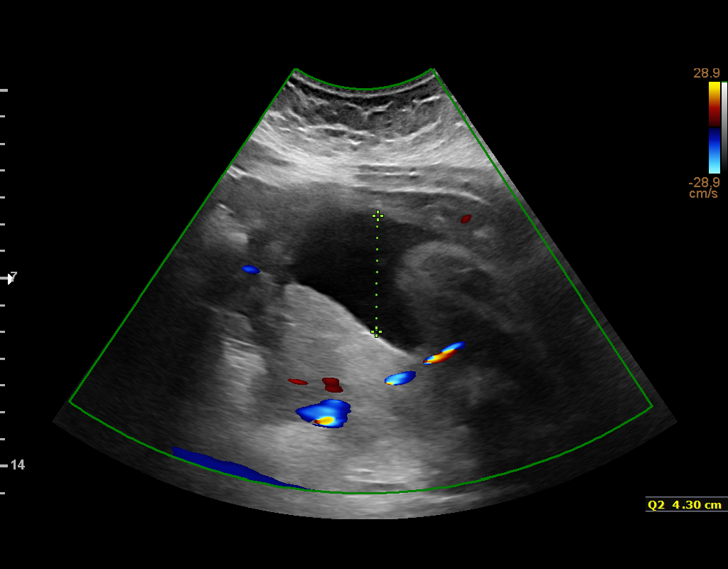
[im 27/35]
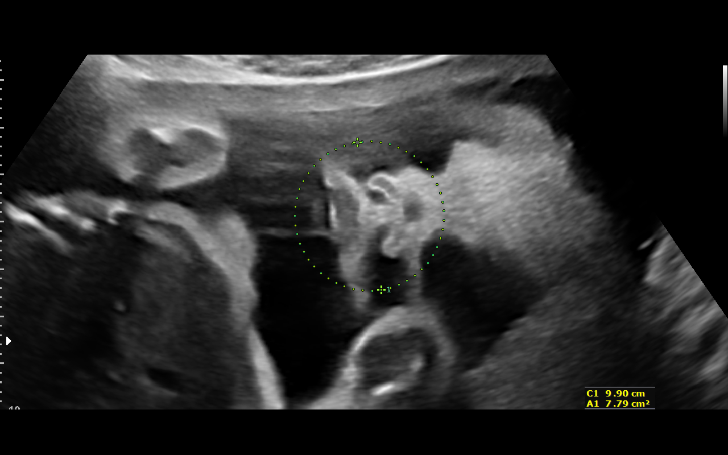
[im 29/35]
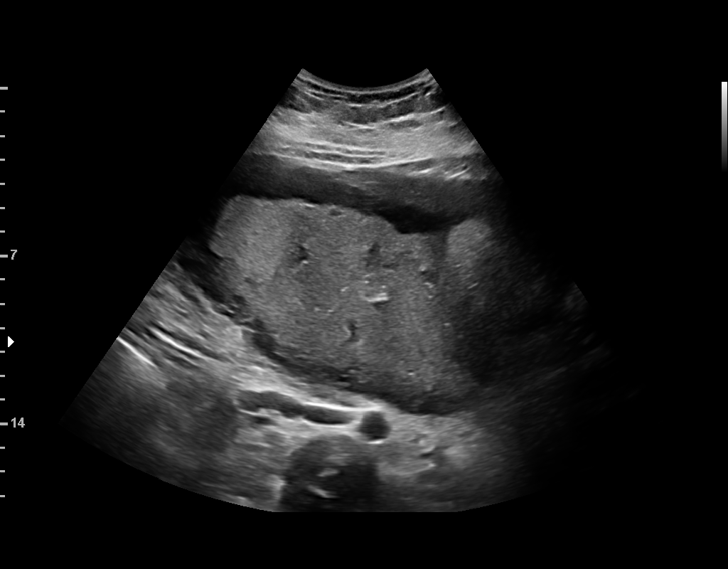
[im 32/35]
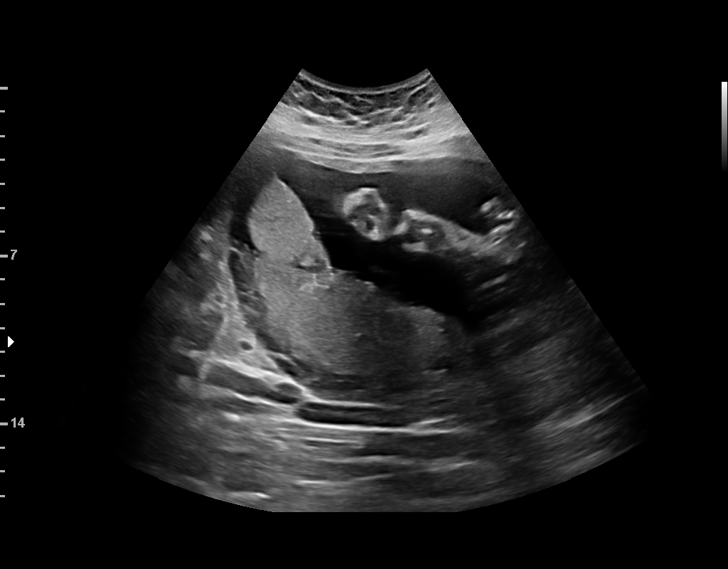
[im 35/35]
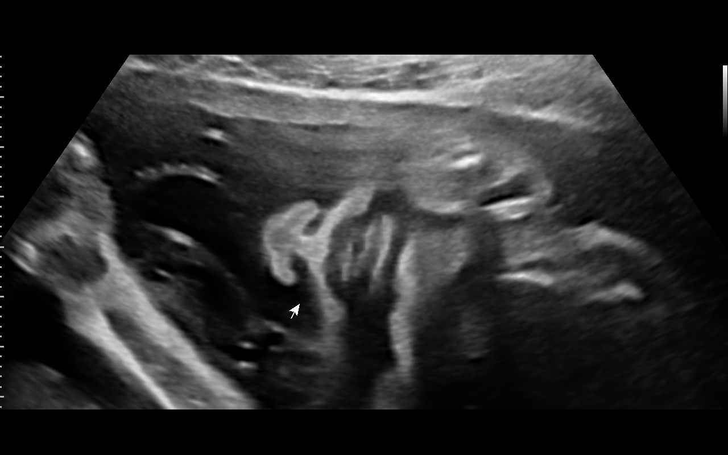

[14 of 28 positions shown; findings below may reference images not displayed]

#130

Indications

Gestational diabetes in pregnancy, insulin
controlled (Humalog, Humalin)
Obesity complicating pregnancy, second
trimester (pregravid BMI 31)
28 weeks gestation of pregnancy
Hypertension - Gestational (procardia)
Mild to moderate preeclampsia, unspecified
trimester
Pre-eclampsia (Mild)
Vital Signs

BMI:         33.67        Pulse:  99
BP:          123/75
Fetal Evaluation

Num Of Fetuses:         1
Fetal Heart Rate(bpm):  143
Cardiac Activity:       Observed
Presentation:           Cephalic
Placenta:               Posterior

Amniotic Fluid
AFI FV:      Within normal limits

AFI Sum(cm)     %Tile       Largest Pocket(cm)
17.33           65

RUQ(cm)       RLQ(cm)       LUQ(cm)        LLQ(cm)
5.72
Biophysical Evaluation

Amniotic F.V:   Within normal limits       F. Tone:        Observed
F. Movement:    Observed                   Score:          [DATE]
F. Breathing:   Observed
OB History

Blood Type:    0
Gravidity:    3         Term:   0        Prem:   0        SAB:   0
TOP:          2       Ectopic:  0        Living: 0
Gestational Age

LMP:           28w 2d        Date:  08/25/17                 EDD:   06/01/18
Best:          28w 2d     Det. By:  LMP  (08/25/17)          EDD:   06/01/18
Cervix Uterus Adnexa

Cervix
Not visualized (advanced GA >77wks)
Impression

Patient with gestational diabetes and preeclampsia was
discharged from the hospital yesterday. She reports her
blood pressures (home) are normal and today's BP was
137/87 mm Hg at home. She does not have symptoms of
severe features of preeclampsia. She takes nifedipine.
Patient takes insulin NPH 18 units at night and 10 units in the
morning. She also takes Humalog 14 units with dinner. Her
post-breakfast today is 215 mg/dL. Post-dinner and fasting
levels are normal.
On ultrasound, amniotic fluid is normal and good fetal activity
is seen. Antenatal testing is reassuring.
I reassured the patient of the findings. Patient had several
questions on management of diabetes. I explained action of
insulin (Humalog and NPH).
I recommended she takes Humalog 4 units now before lunch.
Patient may have a better control with short-acting insulin
with meals.
I recommend initiating Humalog 8 to 10 units with breakfast
and lunch (if high).
Recommendations

Continue weekly antenatal testing till delivery.

## 2019-11-23 ENCOUNTER — Other Ambulatory Visit (HOSPITAL_COMMUNITY): Payer: Self-pay | Admitting: Obstetrics and Gynecology

## 2019-11-23 DIAGNOSIS — Z30431 Encounter for routine checking of intrauterine contraceptive device: Secondary | ICD-10-CM

## 2020-06-27 DIAGNOSIS — M961 Postlaminectomy syndrome, not elsewhere classified: Secondary | ICD-10-CM | POA: Insufficient documentation

## 2020-06-27 DIAGNOSIS — M5416 Radiculopathy, lumbar region: Secondary | ICD-10-CM | POA: Insufficient documentation

## 2020-09-03 DIAGNOSIS — R52 Pain, unspecified: Secondary | ICD-10-CM | POA: Insufficient documentation

## 2020-10-03 DIAGNOSIS — M5126 Other intervertebral disc displacement, lumbar region: Secondary | ICD-10-CM | POA: Insufficient documentation

## 2020-10-03 DIAGNOSIS — M5137 Other intervertebral disc degeneration, lumbosacral region: Secondary | ICD-10-CM | POA: Insufficient documentation

## 2020-10-03 DIAGNOSIS — Z981 Arthrodesis status: Secondary | ICD-10-CM | POA: Insufficient documentation

## 2021-01-16 DIAGNOSIS — F909 Attention-deficit hyperactivity disorder, unspecified type: Secondary | ICD-10-CM | POA: Diagnosis not present

## 2021-01-16 DIAGNOSIS — F329 Major depressive disorder, single episode, unspecified: Secondary | ICD-10-CM | POA: Diagnosis not present

## 2021-01-16 DIAGNOSIS — R030 Elevated blood-pressure reading, without diagnosis of hypertension: Secondary | ICD-10-CM | POA: Diagnosis not present

## 2021-02-13 DIAGNOSIS — M5416 Radiculopathy, lumbar region: Secondary | ICD-10-CM | POA: Diagnosis not present

## 2021-03-06 DIAGNOSIS — N951 Menopausal and female climacteric states: Secondary | ICD-10-CM | POA: Diagnosis not present

## 2021-03-06 DIAGNOSIS — E039 Hypothyroidism, unspecified: Secondary | ICD-10-CM | POA: Diagnosis not present

## 2021-03-10 DIAGNOSIS — R5383 Other fatigue: Secondary | ICD-10-CM | POA: Diagnosis not present

## 2021-03-10 DIAGNOSIS — Z6829 Body mass index (BMI) 29.0-29.9, adult: Secondary | ICD-10-CM | POA: Diagnosis not present

## 2021-03-10 DIAGNOSIS — N951 Menopausal and female climacteric states: Secondary | ICD-10-CM | POA: Diagnosis not present

## 2021-03-10 DIAGNOSIS — R232 Flushing: Secondary | ICD-10-CM | POA: Diagnosis not present

## 2021-03-12 DIAGNOSIS — E782 Mixed hyperlipidemia: Secondary | ICD-10-CM | POA: Diagnosis not present

## 2021-03-12 DIAGNOSIS — E039 Hypothyroidism, unspecified: Secondary | ICD-10-CM | POA: Diagnosis not present

## 2021-03-12 DIAGNOSIS — E611 Iron deficiency: Secondary | ICD-10-CM | POA: Diagnosis not present

## 2021-03-12 DIAGNOSIS — E559 Vitamin D deficiency, unspecified: Secondary | ICD-10-CM | POA: Diagnosis not present

## 2021-03-12 DIAGNOSIS — N951 Menopausal and female climacteric states: Secondary | ICD-10-CM | POA: Diagnosis not present

## 2021-04-07 DIAGNOSIS — R030 Elevated blood-pressure reading, without diagnosis of hypertension: Secondary | ICD-10-CM | POA: Diagnosis not present

## 2021-04-07 DIAGNOSIS — F909 Attention-deficit hyperactivity disorder, unspecified type: Secondary | ICD-10-CM | POA: Diagnosis not present

## 2021-04-07 DIAGNOSIS — F329 Major depressive disorder, single episode, unspecified: Secondary | ICD-10-CM | POA: Diagnosis not present

## 2021-04-07 DIAGNOSIS — Z23 Encounter for immunization: Secondary | ICD-10-CM | POA: Diagnosis not present

## 2021-04-09 DIAGNOSIS — H43393 Other vitreous opacities, bilateral: Secondary | ICD-10-CM | POA: Diagnosis not present

## 2021-06-02 DIAGNOSIS — Z01419 Encounter for gynecological examination (general) (routine) without abnormal findings: Secondary | ICD-10-CM | POA: Diagnosis not present

## 2021-06-02 DIAGNOSIS — N926 Irregular menstruation, unspecified: Secondary | ICD-10-CM | POA: Diagnosis not present

## 2021-06-10 DIAGNOSIS — F909 Attention-deficit hyperactivity disorder, unspecified type: Secondary | ICD-10-CM | POA: Diagnosis not present

## 2021-06-10 DIAGNOSIS — F419 Anxiety disorder, unspecified: Secondary | ICD-10-CM | POA: Diagnosis not present

## 2021-06-10 DIAGNOSIS — F329 Major depressive disorder, single episode, unspecified: Secondary | ICD-10-CM | POA: Diagnosis not present

## 2021-06-11 DIAGNOSIS — J069 Acute upper respiratory infection, unspecified: Secondary | ICD-10-CM | POA: Diagnosis not present

## 2021-06-11 DIAGNOSIS — J029 Acute pharyngitis, unspecified: Secondary | ICD-10-CM | POA: Diagnosis not present

## 2021-06-11 DIAGNOSIS — Z03818 Encounter for observation for suspected exposure to other biological agents ruled out: Secondary | ICD-10-CM | POA: Diagnosis not present

## 2021-06-13 ENCOUNTER — Emergency Department (HOSPITAL_COMMUNITY): Payer: BLUE CROSS/BLUE SHIELD

## 2021-06-13 ENCOUNTER — Emergency Department (HOSPITAL_COMMUNITY)
Admission: EM | Admit: 2021-06-13 | Discharge: 2021-06-13 | Disposition: A | Discharge status: Home / Self Care | Payer: BLUE CROSS/BLUE SHIELD | Attending: Emergency Medicine | Admitting: Emergency Medicine

## 2021-06-13 ENCOUNTER — Other Ambulatory Visit: Payer: Self-pay

## 2021-06-13 ENCOUNTER — Encounter (HOSPITAL_COMMUNITY): Payer: Self-pay | Admitting: *Deleted

## 2021-06-13 DIAGNOSIS — R079 Chest pain, unspecified: Secondary | ICD-10-CM | POA: Diagnosis not present

## 2021-06-13 DIAGNOSIS — R072 Precordial pain: Secondary | ICD-10-CM | POA: Insufficient documentation

## 2021-06-13 DIAGNOSIS — Z91040 Latex allergy status: Secondary | ICD-10-CM | POA: Diagnosis not present

## 2021-06-13 DIAGNOSIS — R030 Elevated blood-pressure reading, without diagnosis of hypertension: Secondary | ICD-10-CM | POA: Insufficient documentation

## 2021-06-13 DIAGNOSIS — I10 Essential (primary) hypertension: Secondary | ICD-10-CM | POA: Diagnosis not present

## 2021-06-13 DIAGNOSIS — R0789 Other chest pain: Secondary | ICD-10-CM | POA: Diagnosis not present

## 2021-06-13 LAB — CBC WITH DIFFERENTIAL/PLATELET
Abs Immature Granulocytes: 0.01 10*3/uL (ref 0.00–0.07)
Basophils Absolute: 0.1 10*3/uL (ref 0.0–0.1)
Basophils Relative: 1 %
Eosinophils Absolute: 0.3 10*3/uL (ref 0.0–0.5)
Eosinophils Relative: 4 %
HCT: 44.2 % (ref 36.0–46.0)
Hemoglobin: 14.2 g/dL (ref 12.0–15.0)
Immature Granulocytes: 0 %
Lymphocytes Relative: 44 %
Lymphs Abs: 2.6 10*3/uL (ref 0.7–4.0)
MCH: 28.5 pg (ref 26.0–34.0)
MCHC: 32.1 g/dL (ref 30.0–36.0)
MCV: 88.6 fL (ref 80.0–100.0)
Monocytes Absolute: 0.5 10*3/uL (ref 0.1–1.0)
Monocytes Relative: 9 %
Neutro Abs: 2.5 10*3/uL (ref 1.7–7.7)
Neutrophils Relative %: 42 %
Platelets: 343 10*3/uL (ref 150–400)
RBC: 4.99 MIL/uL (ref 3.87–5.11)
RDW: 13.2 % (ref 11.5–15.5)
WBC: 5.9 10*3/uL (ref 4.0–10.5)
nRBC: 0 % (ref 0.0–0.2)

## 2021-06-13 LAB — BASIC METABOLIC PANEL
Anion gap: 7 (ref 5–15)
BUN: <5 mg/dL — ABNORMAL LOW (ref 6–20)
CO2: 29 mmol/L (ref 22–32)
Calcium: 9.5 mg/dL (ref 8.9–10.3)
Chloride: 103 mmol/L (ref 98–111)
Creatinine, Ser: 0.94 mg/dL (ref 0.44–1.00)
GFR, Estimated: >60 mL/min (ref >60)
Glucose, Bld: 106 mg/dL — ABNORMAL HIGH (ref 70–99)
Potassium: 3.5 mmol/L (ref 3.5–5.1)
Sodium: 139 mmol/L (ref 135–145)

## 2021-06-13 LAB — I-STAT BETA HCG BLOOD, ED (MC, WL, AP ONLY): I-stat hCG, quantitative: <5 m[IU]/mL (ref <5)

## 2021-06-13 LAB — TROPONIN I (HIGH SENSITIVITY): Troponin I (High Sensitivity): 3 ng/L (ref <18)

## 2021-06-13 NOTE — Discharge Instructions (Signed)
Follow-up with primary care provider, return for new or worsening symptoms

## 2021-06-13 NOTE — ED Triage Notes (Signed)
Chest pain since yesterday and her bp has been elevated with a headache no bp med

## 2021-06-13 NOTE — ED Provider Triage Note (Addendum)
Emergency Medicine Provider Triage Evaluation Note  [name] , a 36 y.o. female  was evaluated in triage.  Pt complains of constant chest pain onset yesterday. She notes that she takes Adderall and used to take 20 mg however it was increased to 30 mg on yesterday with that being her first dose.  She has tried to aspirin which she took 2 hours ago prior to arrival with no relief for his symptoms.  Denies abdominal pain, nausea, vomiting, fever, chills, shortness of breath.  Denies recent injury, trauma, heavy lifting.  Denies past medical history of MI, CAD, hypertension, diabetes, cardiac catheterization, stent placement.  Review of Systems  Positive: As per HPI above Negative: Shortness of breath, abdominal pain  Physical Exam  Ht 5\' 9"  (1.753 m)    Wt 109.3 kg    BMI 35.58 kg/m  Gen:   Awake, no distress   Resp:  Normal effort  MSK:   Moves extremities without difficulty  Other:  No chest wall tenderness to palpation.  Medical Decision Making  Medically screening exam initiated at 4:07 PM.  Appropriate orders placed.  TAYLA PANOZZO was informed that the remainder of the evaluation will be completed by another provider, this initial triage assessment does not replace that evaluation, and the importance of remaining in the ED until their evaluation is complete.    Darick Fetters A, PA-C 06/13/21 1612    Zyhir Cappella A, PA-C 06/13/21 1612

## 2021-06-13 NOTE — ED Provider Notes (Signed)
[facility]   CSN: [id number] Arrival date & time: 06/13/21  1601    History  Chief Complaint  Patient presents with   Chest Pain    [name] is a 36 y.o. female hx of adhd on adderall with recent dosage increase for evaluation of elevated blood pressure.  Noted to have some chest pain which began yesterday.  Took her blood pressure which was 170 systolic.  No history of hypertension.  No radiation.  No associate diaphoresis, nausea, vomiting, shortness of breath.  Took aspirin PTA without any relief in symptoms.  Pain is not exertional or pleuritic in nature.  No history of PE, DVT, lower extremity swelling, redness, swelling, warmth to extremities.  No history of illicit substance use.  No recent trauma or injuries. Some cough, neg covid at PCP 2 days ago.   HPI     Home Medications Prior to Admission medications   Medication Sig Start Date End Date Taking? Authorizing Provider  acetaminophen (TYLENOL) 325 MG tablet Take 2 tablets (650 mg total) by mouth every 6 (six) hours as needed for moderate pain (Take 1-2 tablets every six hours as needed for pain). 10/25/17   Janeece RiggersGreer, Ellis K, CNM  butalbital-acetaminophen-caffeine (FIORICET, ESGIC) 270 127 [id number] MG tablet Take 1 tablet by mouth every 6 (six) hours as needed for headache. Patient not taking: Reported on 05/11/2018 03/10/18   Osborn Cohooberts, Angela, MD  calcium carbonate (TUMS - DOSED IN MG ELEMENTAL CALCIUM) 500 MG chewable tablet Chew 1 tablet by mouth 2 (two) times daily as needed for indigestion or heartburn.    [provider]  docusate sodium (COLACE) 100 MG capsule Take 100 mg by mouth daily as needed for mild constipation.    [provider]  labetalol (NORMODYNE) 200 MG tablet Take 1 tablet (200 mg total) by mouth 2 (two) times daily. 05/12/18   Janeece RiggersGreer, Ellis K, CNM  magnesium gluconate (MAGONATE) 500 MG tablet Take 500 mg by mouth daily.    [provider]  oxyCODONE (OXY IR/ROXICODONE) 5 MG immediate release tablet Take 1 tablet (5 mg total) by mouth every 4 (four) hours as needed for moderate pain. 05/09/18   Dale DurhamMontana, Jade, FNP  Prenatal Vit-Fe Fumarate-FA (PRENATAL MULTIVITAMIN) TABS tablet Take 1 tablet by mouth daily at 12 noon.    [provider]  valACYclovir (VALTREX) 500 MG tablet Take 500 mg by mouth daily.    [provider]      Allergies    Latex, Morphine and related, and Penicillins    Review of Systems   Review of Systems  Constitutional: Negative.   HENT: Negative.    Respiratory:  Positive for cough and chest tightness. Negative for shortness of breath, wheezing and stridor.   Cardiovascular:  Positive for chest pain. Negative for palpitations and leg swelling.  Gastrointestinal: Negative.   Genitourinary: Negative.   Musculoskeletal: Negative.   Skin: Negative.   Neurological: Negative.   All other systems reviewed and are negative.  Physical Exam Updated Vital Signs BP (!) 127/93    Pulse 72    Temp 98.1 F (36.7 C) (Oral)    Resp 14    Ht 5\' 9"  (1.753 m)    Wt 109.3 kg    LMP 05/28/2021    SpO2 99%    BMI 35.58 kg/m  Physical Exam Vitals and nursing note reviewed.  Constitutional:      General: She is not in acute distress.  Appearance: She is well-developed. She is not ill-appearing.  HENT:     Head: Atraumatic.  Eyes:     Pupils: Pupils are equal, round, and reactive to light.  Cardiovascular:     Rate and Rhythm: Normal rate.     Pulses:          Radial pulses are 2+ on the right side and 2+ on the left side.       Dorsalis pedis pulses are 2+ on the right side and 2+ on the left side.     Heart sounds: Normal heart sounds.  Pulmonary:     Effort: Pulmonary effort is normal. No respiratory distress.     Breath sounds: Normal breath sounds.     Comments: Clear bl, speaks in full sentences without difficulty Chest:     Comments: Tenderness left anterior wall without  overlying skin changes Abdominal:     General: Bowel sounds are normal. There is no distension or abdominal bruit.     Palpations: Abdomen is soft. There is no hepatomegaly or mass.     Tenderness: There is no abdominal tenderness. There is no rebound.  Musculoskeletal:        General: Normal range of motion.     Cervical back: Normal range of motion.     Right lower leg: No tenderness. No edema.     Left lower leg: No tenderness. No edema.     Comments: No bony tenderness, compartments soft.  Denna Haggard' sign negative  Skin:    General: Skin is warm and dry.     Capillary Refill: Capillary refill takes less than 2 seconds.  Neurological:     General: No focal deficit present.     Mental Status: She is alert.  Psychiatric:        Mood and Affect: Mood normal.    ED Results / Procedures / Treatments   Labs (all labs ordered are listed, but only abnormal results are displayed) Labs Reviewed  BASIC METABOLIC PANEL - Abnormal; Notable for the following components:      Result Value   Glucose, Bld 106 (*)    BUN <5 (*)    All other components within normal limits  CBC WITH DIFFERENTIAL/PLATELET  I-STAT BETA HCG BLOOD, ED ([facility], [facility], [facility] ONLY)  TROPONIN I (HIGH SENSITIVITY)    EKG EKG Interpretation  Date/Time:  Friday June 13 2021 16:06:40 EST Ventricular Rate:  82 PR Interval:  134 QRS Duration: 76 QT Interval:  340 QTC Calculation: 397 R Axis:   32 Text Interpretation: Normal sinus rhythm with sinus arrhythmia Nonspecific ST and T wave abnormality Abnormal ECG No previous ECGs available Confirmed by Norman Clay ([id number]) on 06/13/2021 7:01:11 PM  Radiology DG Chest 2 View  Result Date: 06/13/2021 CLINICAL DATA:  Chest pain EXAM: CHEST - 2 VIEW COMPARISON:  03/25/2011 FINDINGS: The heart size and mediastinal contours are within normal limits. Both lungs are clear. There is evidence of surgical fusion in the lumbar spine. IMPRESSION: No active cardiopulmonary disease.  Electronically Signed   By: Ernie Avena M.D.   On: 06/13/2021 17:33    Procedures Procedures    Medications Ordered in ED Medications - No data to display  ED Course/ Medical Decision Making/ A&P    Pleasant 36 year old here for evaluation of elevated blood pressure and chest pain.  Recently had Adderall increased.  Describes her chest pain as an aching "like a pulled muscle" denies any recent injury or trauma.  Pain does not radiate.  Nonexertional nonpleuritic in nature.  She is PERC negative, Wells criteria low risk.  No clinical evidence of VTE on exam.  She is neurovascularly intact.  Pressure here is mildly elevated.  Plan on labs, imaging and reassess. Heart score 0  Labs and imaging personally reviewed and interpreted:  CBC without leukocytosis Pregnancy test negative Metabolic panel Troponin 3, symptoms started yesterday do not feel she needs additional troponin here in ED.  Low suspicion for acute ACS, unstable angina DG chest without acute abnormality EKG without ischemic changes  Patient reassessed.  Suggest MSK etiology of her chest pain given cough, question costochondritis.  At this time I low suspicion for acute ACS, PE, dissection, bacterial infectious process.  Regards to her blood pressure is come down here in the ED.  Question this is from her recent increase in her Adderall.  Discussed keeping track of blood pressures, following with primary care provider, return for new or worsening symptoms.  Low suspicion for acute hypertensive urgency or emergency at this time  The patient has been appropriately medically screened and/or stabilized in the ED. I have low suspicion for any other emergent medical condition which would require further screening, evaluation or treatment in the ED or require inpatient management.  Patient is hemodynamically stable and in no acute distress.  Patient able to ambulate in department prior to ED.  Evaluation does not show acute  pathology that would require ongoing or additional emergent interventions while in the emergency department or further inpatient treatment.  I have discussed the diagnosis with the patient and answered all questions.  Pain is been managed while in the emergency department and patient has no further complaints prior to discharge.  Patient is comfortable with plan discussed in room and is stable for discharge at this time.  I have discussed strict return precautions for returning to the emergency department.  Patient was encouraged to follow-up with PCP/specialist refer to at discharge.                           Medical Decision Making Amount and/or Complexity of Data Reviewed External Data Reviewed: notes. Labs: ordered. Decision-making details documented in ED Course. Radiology: ordered and independent interpretation performed. Decision-making details documented in ED Course. ECG/medicine tests: ordered and independent interpretation performed. Decision-making details documented in ED Course.  Risk OTC drugs. Prescription drug management. Parenteral controlled substances.         Final Clinical Impression(s) / ED Diagnoses Final diagnoses:  Precordial pain  Elevated blood pressure reading    Rx / DC Orders ED Discharge Orders     None         Linwood Dibbles, PA-C 06/13/21 1914    Cheryll Cockayne, MD 06/20/21 1630

## 2021-06-16 DIAGNOSIS — N951 Menopausal and female climacteric states: Secondary | ICD-10-CM | POA: Diagnosis not present

## 2021-06-16 DIAGNOSIS — E039 Hypothyroidism, unspecified: Secondary | ICD-10-CM | POA: Diagnosis not present

## 2021-06-18 DIAGNOSIS — Z6831 Body mass index (BMI) 31.0-31.9, adult: Secondary | ICD-10-CM | POA: Diagnosis not present

## 2021-06-18 DIAGNOSIS — R6882 Decreased libido: Secondary | ICD-10-CM | POA: Diagnosis not present

## 2021-06-18 DIAGNOSIS — R5383 Other fatigue: Secondary | ICD-10-CM | POA: Diagnosis not present

## 2021-06-18 DIAGNOSIS — E039 Hypothyroidism, unspecified: Secondary | ICD-10-CM | POA: Diagnosis not present

## 2021-07-02 DIAGNOSIS — N898 Other specified noninflammatory disorders of vagina: Secondary | ICD-10-CM | POA: Diagnosis not present

## 2021-07-02 DIAGNOSIS — N760 Acute vaginitis: Secondary | ICD-10-CM | POA: Diagnosis not present

## 2021-07-02 DIAGNOSIS — Z113 Encounter for screening for infections with a predominantly sexual mode of transmission: Secondary | ICD-10-CM | POA: Diagnosis not present

## 2021-08-19 DIAGNOSIS — F329 Major depressive disorder, single episode, unspecified: Secondary | ICD-10-CM | POA: Diagnosis not present

## 2021-08-19 DIAGNOSIS — F419 Anxiety disorder, unspecified: Secondary | ICD-10-CM | POA: Diagnosis not present

## 2021-08-19 DIAGNOSIS — F909 Attention-deficit hyperactivity disorder, unspecified type: Secondary | ICD-10-CM | POA: Diagnosis not present

## 2021-10-24 DIAGNOSIS — N951 Menopausal and female climacteric states: Secondary | ICD-10-CM | POA: Diagnosis not present

## 2021-10-24 DIAGNOSIS — E559 Vitamin D deficiency, unspecified: Secondary | ICD-10-CM | POA: Diagnosis not present

## 2021-10-24 DIAGNOSIS — E611 Iron deficiency: Secondary | ICD-10-CM | POA: Diagnosis not present

## 2021-10-24 DIAGNOSIS — E039 Hypothyroidism, unspecified: Secondary | ICD-10-CM | POA: Diagnosis not present

## 2021-10-29 DIAGNOSIS — N951 Menopausal and female climacteric states: Secondary | ICD-10-CM | POA: Diagnosis not present

## 2021-10-29 DIAGNOSIS — Z6831 Body mass index (BMI) 31.0-31.9, adult: Secondary | ICD-10-CM | POA: Diagnosis not present

## 2021-10-29 DIAGNOSIS — R232 Flushing: Secondary | ICD-10-CM | POA: Diagnosis not present

## 2021-10-29 DIAGNOSIS — R6882 Decreased libido: Secondary | ICD-10-CM | POA: Diagnosis not present

## 2021-11-10 DIAGNOSIS — J069 Acute upper respiratory infection, unspecified: Secondary | ICD-10-CM | POA: Diagnosis not present

## 2021-11-10 DIAGNOSIS — J029 Acute pharyngitis, unspecified: Secondary | ICD-10-CM | POA: Diagnosis not present

## 2021-11-13 DIAGNOSIS — F329 Major depressive disorder, single episode, unspecified: Secondary | ICD-10-CM | POA: Diagnosis not present

## 2021-11-13 DIAGNOSIS — F909 Attention-deficit hyperactivity disorder, unspecified type: Secondary | ICD-10-CM | POA: Diagnosis not present

## 2021-11-13 DIAGNOSIS — F419 Anxiety disorder, unspecified: Secondary | ICD-10-CM | POA: Diagnosis not present

## 2021-11-24 DIAGNOSIS — R0982 Postnasal drip: Secondary | ICD-10-CM | POA: Diagnosis not present

## 2021-11-24 DIAGNOSIS — J029 Acute pharyngitis, unspecified: Secondary | ICD-10-CM | POA: Diagnosis not present

## 2021-12-18 DIAGNOSIS — N951 Menopausal and female climacteric states: Secondary | ICD-10-CM | POA: Diagnosis not present

## 2021-12-18 DIAGNOSIS — Z6830 Body mass index (BMI) 30.0-30.9, adult: Secondary | ICD-10-CM | POA: Diagnosis not present

## 2021-12-18 DIAGNOSIS — E782 Mixed hyperlipidemia: Secondary | ICD-10-CM | POA: Diagnosis not present

## 2021-12-18 DIAGNOSIS — Z131 Encounter for screening for diabetes mellitus: Secondary | ICD-10-CM | POA: Diagnosis not present

## 2021-12-18 DIAGNOSIS — E559 Vitamin D deficiency, unspecified: Secondary | ICD-10-CM | POA: Diagnosis not present

## 2021-12-18 DIAGNOSIS — E039 Hypothyroidism, unspecified: Secondary | ICD-10-CM | POA: Diagnosis not present

## 2021-12-18 DIAGNOSIS — E611 Iron deficiency: Secondary | ICD-10-CM | POA: Diagnosis not present

## 2021-12-25 DIAGNOSIS — E559 Vitamin D deficiency, unspecified: Secondary | ICD-10-CM | POA: Diagnosis not present

## 2021-12-25 DIAGNOSIS — Z6830 Body mass index (BMI) 30.0-30.9, adult: Secondary | ICD-10-CM | POA: Diagnosis not present

## 2022-01-08 DIAGNOSIS — J018 Other acute sinusitis: Secondary | ICD-10-CM | POA: Diagnosis not present

## 2022-01-08 DIAGNOSIS — F419 Anxiety disorder, unspecified: Secondary | ICD-10-CM | POA: Diagnosis not present

## 2022-01-08 DIAGNOSIS — F329 Major depressive disorder, single episode, unspecified: Secondary | ICD-10-CM | POA: Diagnosis not present

## 2022-01-08 DIAGNOSIS — F909 Attention-deficit hyperactivity disorder, unspecified type: Secondary | ICD-10-CM | POA: Diagnosis not present

## 2022-01-08 DIAGNOSIS — J029 Acute pharyngitis, unspecified: Secondary | ICD-10-CM | POA: Diagnosis not present

## 2022-02-03 DIAGNOSIS — E611 Iron deficiency: Secondary | ICD-10-CM | POA: Diagnosis not present

## 2022-02-03 DIAGNOSIS — N951 Menopausal and female climacteric states: Secondary | ICD-10-CM | POA: Diagnosis not present

## 2022-02-03 DIAGNOSIS — E559 Vitamin D deficiency, unspecified: Secondary | ICD-10-CM | POA: Diagnosis not present

## 2022-02-03 DIAGNOSIS — E039 Hypothyroidism, unspecified: Secondary | ICD-10-CM | POA: Diagnosis not present

## 2022-02-04 DIAGNOSIS — F909 Attention-deficit hyperactivity disorder, unspecified type: Secondary | ICD-10-CM | POA: Diagnosis not present

## 2022-02-04 DIAGNOSIS — F419 Anxiety disorder, unspecified: Secondary | ICD-10-CM | POA: Diagnosis not present

## 2022-02-04 DIAGNOSIS — F329 Major depressive disorder, single episode, unspecified: Secondary | ICD-10-CM | POA: Diagnosis not present

## 2022-03-04 DIAGNOSIS — U071 COVID-19: Secondary | ICD-10-CM | POA: Diagnosis not present

## 2022-03-04 DIAGNOSIS — R051 Acute cough: Secondary | ICD-10-CM | POA: Diagnosis not present

## 2022-03-04 DIAGNOSIS — R52 Pain, unspecified: Secondary | ICD-10-CM | POA: Diagnosis not present

## 2022-03-04 DIAGNOSIS — R5383 Other fatigue: Secondary | ICD-10-CM | POA: Diagnosis not present

## 2022-04-06 DIAGNOSIS — F909 Attention-deficit hyperactivity disorder, unspecified type: Secondary | ICD-10-CM | POA: Diagnosis not present

## 2022-04-06 DIAGNOSIS — F419 Anxiety disorder, unspecified: Secondary | ICD-10-CM | POA: Diagnosis not present

## 2022-04-06 DIAGNOSIS — F329 Major depressive disorder, single episode, unspecified: Secondary | ICD-10-CM | POA: Diagnosis not present

## 2022-04-15 DIAGNOSIS — N951 Menopausal and female climacteric states: Secondary | ICD-10-CM | POA: Diagnosis not present

## 2022-04-15 DIAGNOSIS — D649 Anemia, unspecified: Secondary | ICD-10-CM | POA: Diagnosis not present

## 2022-04-15 DIAGNOSIS — E039 Hypothyroidism, unspecified: Secondary | ICD-10-CM | POA: Diagnosis not present

## 2022-04-17 DIAGNOSIS — R6882 Decreased libido: Secondary | ICD-10-CM | POA: Diagnosis not present

## 2022-04-17 DIAGNOSIS — Z79890 Hormone replacement therapy (postmenopausal): Secondary | ICD-10-CM | POA: Diagnosis not present

## 2022-04-17 DIAGNOSIS — R5383 Other fatigue: Secondary | ICD-10-CM | POA: Diagnosis not present

## 2022-04-17 DIAGNOSIS — N951 Menopausal and female climacteric states: Secondary | ICD-10-CM | POA: Diagnosis not present

## 2022-04-28 DIAGNOSIS — Z113 Encounter for screening for infections with a predominantly sexual mode of transmission: Secondary | ICD-10-CM | POA: Diagnosis not present

## 2022-04-28 DIAGNOSIS — N898 Other specified noninflammatory disorders of vagina: Secondary | ICD-10-CM | POA: Diagnosis not present

## 2022-04-28 DIAGNOSIS — Z1159 Encounter for screening for other viral diseases: Secondary | ICD-10-CM | POA: Diagnosis not present

## 2022-04-28 DIAGNOSIS — Z114 Encounter for screening for human immunodeficiency virus [HIV]: Secondary | ICD-10-CM | POA: Diagnosis not present

## 2022-04-29 DIAGNOSIS — D649 Anemia, unspecified: Secondary | ICD-10-CM | POA: Diagnosis not present

## 2022-05-14 DIAGNOSIS — D500 Iron deficiency anemia secondary to blood loss (chronic): Secondary | ICD-10-CM | POA: Diagnosis not present

## 2022-05-14 DIAGNOSIS — R5383 Other fatigue: Secondary | ICD-10-CM | POA: Diagnosis not present

## 2022-05-19 DIAGNOSIS — Z6831 Body mass index (BMI) 31.0-31.9, adult: Secondary | ICD-10-CM | POA: Diagnosis not present

## 2022-05-19 DIAGNOSIS — E611 Iron deficiency: Secondary | ICD-10-CM | POA: Diagnosis not present

## 2022-05-19 DIAGNOSIS — N920 Excessive and frequent menstruation with regular cycle: Secondary | ICD-10-CM | POA: Diagnosis not present

## 2022-05-19 DIAGNOSIS — R5383 Other fatigue: Secondary | ICD-10-CM | POA: Diagnosis not present

## 2022-07-07 DIAGNOSIS — F329 Major depressive disorder, single episode, unspecified: Secondary | ICD-10-CM | POA: Diagnosis not present

## 2022-07-07 DIAGNOSIS — F419 Anxiety disorder, unspecified: Secondary | ICD-10-CM | POA: Diagnosis not present

## 2022-07-07 DIAGNOSIS — F909 Attention-deficit hyperactivity disorder, unspecified type: Secondary | ICD-10-CM | POA: Diagnosis not present

## 2022-07-13 DIAGNOSIS — R030 Elevated blood-pressure reading, without diagnosis of hypertension: Secondary | ICD-10-CM | POA: Diagnosis not present

## 2022-07-13 DIAGNOSIS — L282 Other prurigo: Secondary | ICD-10-CM | POA: Diagnosis not present

## 2022-07-16 ENCOUNTER — Emergency Department (HOSPITAL_BASED_OUTPATIENT_CLINIC_OR_DEPARTMENT_OTHER): Payer: BLUE CROSS/BLUE SHIELD | Admitting: Radiology

## 2022-07-16 ENCOUNTER — Encounter (HOSPITAL_BASED_OUTPATIENT_CLINIC_OR_DEPARTMENT_OTHER): Payer: Self-pay | Admitting: Emergency Medicine

## 2022-07-16 ENCOUNTER — Other Ambulatory Visit: Payer: Self-pay

## 2022-07-16 DIAGNOSIS — F419 Anxiety disorder, unspecified: Secondary | ICD-10-CM | POA: Diagnosis not present

## 2022-07-16 DIAGNOSIS — I10 Essential (primary) hypertension: Secondary | ICD-10-CM | POA: Diagnosis not present

## 2022-07-16 DIAGNOSIS — R519 Headache, unspecified: Secondary | ICD-10-CM | POA: Diagnosis not present

## 2022-07-16 DIAGNOSIS — F329 Major depressive disorder, single episode, unspecified: Secondary | ICD-10-CM | POA: Diagnosis not present

## 2022-07-16 DIAGNOSIS — Z79899 Other long term (current) drug therapy: Secondary | ICD-10-CM | POA: Diagnosis not present

## 2022-07-16 DIAGNOSIS — Z91040 Latex allergy status: Secondary | ICD-10-CM | POA: Insufficient documentation

## 2022-07-16 DIAGNOSIS — F909 Attention-deficit hyperactivity disorder, unspecified type: Secondary | ICD-10-CM | POA: Diagnosis not present

## 2022-07-16 NOTE — ED Triage Notes (Addendum)
Pt in with reported hypertension x 3 days ([id number] systolic). Pt also reports headache intermittently throughout the past few days. States headache began around 3pm today, endorses photophobia. Pt states she takes HCTZ for hypertension, but states it isn't helping. Pt also reports that she began to have central cp this afternoon, pain radiates down L arm

## 2022-07-17 ENCOUNTER — Emergency Department (HOSPITAL_BASED_OUTPATIENT_CLINIC_OR_DEPARTMENT_OTHER)
Admission: EM | Admit: 2022-07-17 | Discharge: 2022-07-17 | Disposition: A | Discharge status: Home / Self Care | Payer: BLUE CROSS/BLUE SHIELD | Attending: Emergency Medicine | Admitting: Emergency Medicine

## 2022-07-17 DIAGNOSIS — R079 Chest pain, unspecified: Secondary | ICD-10-CM | POA: Diagnosis not present

## 2022-07-17 DIAGNOSIS — R0789 Other chest pain: Secondary | ICD-10-CM

## 2022-07-17 DIAGNOSIS — I10 Essential (primary) hypertension: Secondary | ICD-10-CM

## 2022-07-17 LAB — BASIC METABOLIC PANEL
Anion gap: 10 (ref 5–15)
BUN: 14 mg/dL (ref 6–20)
CO2: 26 mmol/L (ref 22–32)
Calcium: 10.2 mg/dL (ref 8.9–10.3)
Chloride: 99 mmol/L (ref 98–111)
Creatinine, Ser: 1.11 mg/dL — ABNORMAL HIGH (ref 0.44–1.00)
GFR, Estimated: >60 mL/min (ref >60)
Glucose, Bld: 125 mg/dL — ABNORMAL HIGH (ref 70–99)
Potassium: 3.8 mmol/L (ref 3.5–5.1)
Sodium: 135 mmol/L (ref 135–145)

## 2022-07-17 LAB — CBC
HCT: 42.2 % (ref 36.0–46.0)
Hemoglobin: 14.2 g/dL (ref 12.0–15.0)
MCH: 27.4 pg (ref 26.0–34.0)
MCHC: 33.6 g/dL (ref 30.0–36.0)
MCV: 81.3 fL (ref 80.0–100.0)
Platelets: 422 10*3/uL — ABNORMAL HIGH (ref 150–400)
RBC: 5.19 MIL/uL — ABNORMAL HIGH (ref 3.87–5.11)
RDW: 15.1 % (ref 11.5–15.5)
WBC: 8.3 10*3/uL (ref 4.0–10.5)
nRBC: 0 % (ref 0.0–0.2)

## 2022-07-17 LAB — TROPONIN I (HIGH SENSITIVITY): Troponin I (High Sensitivity): 4 ng/L (ref <18)

## 2022-07-17 LAB — PREGNANCY, URINE: Preg Test, Ur: NEGATIVE

## 2022-07-17 NOTE — ED Provider Notes (Signed)
[place]  Provider Note  CSN: [id number] Arrival date & time: 07/16/22 2251  History Chief Complaint  Patient presents with   Hypertension   Chest Pain    [name] is a 37 y.o. female with prior history of gestational HTN does not carry a diagnosis of chronic HTN has had elevated BP readings at home recently along with headache and chest pains for about 3 days. No SOB. She went to [facility] clinic and was started on HCTZ but hasn't noticed an improvement in the 2 days since she started it. She denies any other neuro complaints.    Home Medications Prior to Admission medications   Medication Sig Start Date End Date Taking? Authorizing Provider  acetaminophen (TYLENOL) 325 MG tablet Take 2 tablets (650 mg total) by mouth every 6 (six) hours as needed for moderate pain (Take 1-2 tablets every six hours as needed for pain). 10/25/17   Marikay Alar, CNM  butalbital-acetaminophen-caffeine (FIORICET, ESGIC) [phone] MG tablet Take 1 tablet by mouth every 6 (six) hours as needed for headache. Patient not taking: Reported on 05/11/2018 03/10/18   Everett Graff, MD  calcium carbonate (TUMS - DOSED IN MG ELEMENTAL CALCIUM) 500 MG chewable tablet Chew 1 tablet by mouth 2 (two) times daily as needed for indigestion or heartburn.    [provider]  docusate sodium (COLACE) 100 MG capsule Take 100 mg by mouth daily as needed for mild constipation.    [provider]  labetalol (NORMODYNE) 200 MG tablet Take 1 tablet (200 mg total) by mouth 2 (two) times daily. 05/12/18   Marikay Alar, CNM  magnesium gluconate (MAGONATE) 500 MG tablet Take 500 mg by mouth daily.    [provider]  oxyCODONE (OXY IR/ROXICODONE) 5 MG immediate release tablet Take 1 tablet (5 mg total) by mouth every 4 (four) hours as needed for moderate pain. 05/09/18   Noralyn Pick, [place]  Prenatal Vit-Fe Fumarate-FA (PRENATAL MULTIVITAMIN) TABS tablet  Take 1 tablet by mouth daily at 12 noon.    [provider]  valACYclovir (VALTREX) 500 MG tablet Take 500 mg by mouth daily.    [provider]     Allergies    Latex, Morphine and related, and Penicillins   Review of Systems   Review of Systems Please see HPI for pertinent positives and negatives  Physical Exam BP (!) 142/91   Pulse 98   Temp 98.1 F (36.7 C) (Oral)   Resp 18   Wt 99.3 kg   LMP 06/25/2022   SpO2 94%   BMI 32.34 kg/m   Physical Exam Vitals and nursing note reviewed.  Constitutional:      Appearance: Normal appearance.  HENT:     Head: Normocephalic and atraumatic.     Nose: Nose normal.     Mouth/Throat:     Mouth: Mucous membranes are moist.  Eyes:     Extraocular Movements: Extraocular movements intact.     Conjunctiva/sclera: Conjunctivae normal.  Cardiovascular:     Rate and Rhythm: Normal rate.  Pulmonary:     Effort: Pulmonary effort is normal.     Breath sounds: Normal breath sounds.  Abdominal:     General: Abdomen is flat.     Palpations: Abdomen is soft.     Tenderness: There is no abdominal tenderness.  Musculoskeletal:        General: No swelling. Normal range of motion.     Cervical back:  Neck supple.  Skin:    General: Skin is warm and dry.  Neurological:     General: No focal deficit present.     Mental Status: She is alert.  Psychiatric:        Mood and Affect: Mood normal.     ED Results / Procedures / Treatments   EKG EKG Interpretation  Date/Time:  Thursday July 16 2022 23:45:06 EST Ventricular Rate:  81 PR Interval:  138 QRS Duration: 82 QT Interval:  354 QTC Calculation: 411 R Axis:   53 Text Interpretation: Normal sinus rhythm Nonspecific T wave abnormality Abnormal ECG When compared with ECG of 13-Jun-2021 16:06, No significant change was found Confirmed by Calvert Cantor [phone]) on 07/17/2022 2:13:37 AM  Procedures Procedures  Medications Ordered in the ED Medications - No data to  display  Initial Impression and Plan  Patient here for evaluation of continued HTN, recently started on HCTZ, reports she was on nifedipine during her pregnancy and questions whether she should be back on that. She is resting comfortably in the ED, no distress, sleeping soundly on my initial evaluation. Labs done in triage show normal CBC, BMP and Trop. No signs of end organ damage. I personally viewed the images from radiology studies and agree with radiologist interpretation: CXR is clear. Offered medications for headache but she declines. Recommend she speak with her PCP regarding medication changes but advised it may take 5-7 days to fully see the effects of a new medication like HCTZ. She does not have any indication for admission at this time. PCP follow up, RTED for any other concerns.    ED Course       MDM Rules/Calculators/A&P Medical Decision Making Given presenting complaint, I considered that admission might be necessary. After review of results from ED lab and/or imaging studies, admission to the hospital is not indicated at this time.    Problems Addressed: Atypical chest pain: acute illness or injury Uncontrolled hypertension: acute illness or injury  Amount and/or Complexity of Data Reviewed Labs: ordered. Decision-making details documented in ED Course. Radiology: ordered and independent interpretation performed. Decision-making details documented in ED Course. ECG/medicine tests: ordered and independent interpretation performed. Decision-making details documented in ED Course.  Risk Prescription drug management. Decision regarding hospitalization.     Final Clinical Impression(s) / ED Diagnoses Final diagnoses:  Atypical chest pain  Uncontrolled hypertension    Rx / DC Orders ED Discharge Orders     None        Truddie Hidden, MD 07/17/22 [phone]

## 2022-07-29 DIAGNOSIS — E039 Hypothyroidism, unspecified: Secondary | ICD-10-CM | POA: Diagnosis not present

## 2022-07-29 DIAGNOSIS — Z6831 Body mass index (BMI) 31.0-31.9, adult: Secondary | ICD-10-CM | POA: Diagnosis not present

## 2022-07-31 ENCOUNTER — Ambulatory Visit (HOSPITAL_BASED_OUTPATIENT_CLINIC_OR_DEPARTMENT_OTHER)
Admission: RE | Admit: 2022-07-31 | Payer: Medicaid Other | Source: Ambulatory Visit | Admitting: Obstetrics and Gynecology

## 2022-07-31 SURGERY — ABLATION, ENDOMETRIUM
Anesthesia: General

## 2022-08-05 DIAGNOSIS — F909 Attention-deficit hyperactivity disorder, unspecified type: Secondary | ICD-10-CM | POA: Insufficient documentation

## 2022-08-05 DIAGNOSIS — F3340 Major depressive disorder, recurrent, in remission, unspecified: Secondary | ICD-10-CM | POA: Insufficient documentation

## 2022-08-05 DIAGNOSIS — E7800 Pure hypercholesterolemia, unspecified: Secondary | ICD-10-CM | POA: Insufficient documentation

## 2022-08-05 DIAGNOSIS — N83201 Unspecified ovarian cyst, right side: Secondary | ICD-10-CM | POA: Insufficient documentation

## 2022-08-05 DIAGNOSIS — Z8632 Personal history of gestational diabetes: Secondary | ICD-10-CM | POA: Insufficient documentation

## 2022-08-05 DIAGNOSIS — M5430 Sciatica, unspecified side: Secondary | ICD-10-CM | POA: Insufficient documentation

## 2022-08-05 DIAGNOSIS — I10 Essential (primary) hypertension: Secondary | ICD-10-CM | POA: Insufficient documentation

## 2022-08-06 ENCOUNTER — Encounter: Payer: Self-pay | Admitting: Physician Assistant

## 2022-08-06 ENCOUNTER — Ambulatory Visit (INDEPENDENT_AMBULATORY_CARE_PROVIDER_SITE_OTHER): Payer: BLUE CROSS/BLUE SHIELD | Admitting: Physician Assistant

## 2022-08-06 VITALS — BP 130/80 | HR 96 | Temp 97.8°F | Ht 69.5 in | Wt 215.5 lb

## 2022-08-06 DIAGNOSIS — Z0000 Encounter for general adult medical examination without abnormal findings: Secondary | ICD-10-CM

## 2022-08-06 DIAGNOSIS — Z1159 Encounter for screening for other viral diseases: Secondary | ICD-10-CM | POA: Diagnosis not present

## 2022-08-06 DIAGNOSIS — Z13220 Encounter for screening for lipoid disorders: Secondary | ICD-10-CM

## 2022-08-06 DIAGNOSIS — I10 Essential (primary) hypertension: Secondary | ICD-10-CM

## 2022-08-06 DIAGNOSIS — Z136 Encounter for screening for cardiovascular disorders: Secondary | ICD-10-CM

## 2022-08-06 DIAGNOSIS — E669 Obesity, unspecified: Secondary | ICD-10-CM

## 2022-08-06 DIAGNOSIS — Z8632 Personal history of gestational diabetes: Secondary | ICD-10-CM | POA: Diagnosis not present

## 2022-08-06 LAB — CBC WITH DIFFERENTIAL/PLATELET
Basophils Absolute: 0 10*3/uL (ref 0.0–0.1)
Basophils Relative: 0.6 % (ref 0.0–3.0)
Eosinophils Absolute: 0.1 10*3/uL (ref 0.0–0.7)
Eosinophils Relative: 1.5 % (ref 0.0–5.0)
HCT: 44.6 % (ref 36.0–46.0)
Hemoglobin: 14.9 g/dL (ref 12.0–15.0)
Lymphocytes Relative: 39.8 % (ref 12.0–46.0)
Lymphs Abs: 2.8 10*3/uL (ref 0.7–4.0)
MCHC: 33.5 g/dL (ref 30.0–36.0)
MCV: 82.3 fl (ref 78.0–100.0)
Monocytes Absolute: 0.5 10*3/uL (ref 0.1–1.0)
Monocytes Relative: 6.5 % (ref 3.0–12.0)
Neutro Abs: 3.6 10*3/uL (ref 1.4–7.7)
Neutrophils Relative %: 51.6 % (ref 43.0–77.0)
Platelets: 430 10*3/uL — ABNORMAL HIGH (ref 150.0–400.0)
RBC: 5.42 Mil/uL — ABNORMAL HIGH (ref 3.87–5.11)
RDW: 16.8 % — ABNORMAL HIGH (ref 11.5–15.5)
WBC: 7 10*3/uL (ref 4.0–10.5)

## 2022-08-06 LAB — LIPID PANEL
Cholesterol: 283 mg/dL — ABNORMAL HIGH (ref 0–200)
HDL: 42.6 mg/dL (ref >39.00)
LDL Cholesterol: 205 mg/dL — ABNORMAL HIGH (ref 0–99)
NonHDL: 240.37
Total CHOL/HDL Ratio: 7
Triglycerides: 175 mg/dL — ABNORMAL HIGH (ref 0.0–149.0)
VLDL: 35 mg/dL (ref 0.0–40.0)

## 2022-08-06 LAB — COMPREHENSIVE METABOLIC PANEL
ALT: 17 U/L (ref 0–35)
AST: 20 U/L (ref 0–37)
Albumin: 4.6 g/dL (ref 3.5–5.2)
Alkaline Phosphatase: 78 U/L (ref 39–117)
BUN: 11 mg/dL (ref 6–23)
CO2: 27 mEq/L (ref 19–32)
Calcium: 9.7 mg/dL (ref 8.4–10.5)
Chloride: 101 mEq/L (ref 96–112)
Creatinine, Ser: 1.13 mg/dL (ref 0.40–1.20)
GFR: 62.8 mL/min (ref >60.00)
Glucose, Bld: 87 mg/dL (ref 70–99)
Potassium: 4.1 mEq/L (ref 3.5–5.1)
Sodium: 136 mEq/L (ref 135–145)
Total Bilirubin: 0.7 mg/dL (ref 0.2–1.2)
Total Protein: 7.8 g/dL (ref 6.0–8.3)

## 2022-08-06 LAB — HEMOGLOBIN A1C: Hgb A1c MFr Bld: 5.7 % (ref 4.6–6.5)

## 2022-08-06 NOTE — Progress Notes (Signed)
Subjective:    [name] is a 37 y.o. female and is here for a comprehensive physical exam and to establish care.  HPI  Health Maintenance Due  Topic Date Due   Hepatitis C Screening  Never done   PAP SMEAR-Modifier  Never done    Acute Concerns: None.  Chronic Issues: HTN: She reports her blood pressure has been fluctuating.  She monitors her blood pressure at home and reports a reading as high as [id number] systolic.  She endorses migraines when her blood pressure is elevated.  She attributes her high blood pressure to stress from work.  She is currently compliant with 5 mg of Amlodipine for about a week. She previously had an endometrial ablation scheduled on 3/22 with Dr. Garwin Brothers but this has to be post-poned due to her BP and gyn is now asking for her to see  cardiology.  She was taking Adderall for her ADHD but her prescriber has changed this to Methylphenidate to help with her BP. She has been taking testosterone for about a year through [facility] for low testosterone. She also uses e-cigarettes daily  Gestational Diabetes: She was previously prescribed insulin during pregnancy She did not require continuation of this Reports subsequent lab work has shown normal blood sugars   Health Maintenance: Immunizations -- UTD Colonoscopy -- N/A Mammogram -- N/A PAP -- Hx of abnormal pap in 2019. Colposcopy results were normal.  Bone Density -- N/A Diet -- Avoids salt and caffeine.  Exercise -- She has been unable to exercise regularly due to work  Sleep habits -- overall controlled Mood -- Stable. Work related stress.   UTD with dentist? - yes UTD with eye doctor? - yes  Weight history: Wt Readings from Last 10 Encounters:  08/06/22 215 lb 8 oz (97.8 kg)  07/16/22 219 lb (99.3 kg)  06/13/21 240 lb 15.4 oz (109.3 kg)  05/11/18 241 lb (109.3 kg)  05/02/18 248 lb 0.3 oz (112.5 kg)  04/10/18 240 lb (108.9 kg)  03/28/18 234 lb (106.1 kg)  03/18/18 233 lb 12.8 oz  (106.1 kg)  03/11/18 233 lb 3.2 oz (105.8 kg)  03/04/18 228 lb (103.4 kg)   Body mass index is 31.37 kg/m. Patient's last menstrual period was 07/27/2022 (exact date).  Alcohol use:  reports that she does not currently use alcohol after a past usage of about 1.0 standard drink of alcohol per week.  Tobacco use:  Tobacco Use: Medium Risk (08/06/2022)   Patient History    Smoking Tobacco Use: Former    Smokeless Tobacco Use: Never    Passive Exposure: Not on file   Eligible for lung cancer screening? No     08/06/2022   10:52 AM  Depression screen PHQ 2/9  Decreased Interest 1  Down, Depressed, Hopeless 2  PHQ - 2 Score 3  Altered sleeping 3  Tired, decreased energy 3  Change in appetite 1  Feeling bad or failure about yourself  0  Trouble concentrating 2  Moving slowly or fidgety/restless 1  Suicidal thoughts 0  PHQ-9 Score 13  Difficult doing work/chores Somewhat difficult     Other providers/specialists: Patient Care Team: Inda Coke, [place] as PCP - General (Physician Assistant)    PMHx, SurgHx, SocialHx, Medications, and Allergies were reviewed in the Visit Navigator and updated as appropriate.   Past Medical History:  Diagnosis Date   Asthma    Complication of anesthesia    trouble waking up   Diabetes mellitus without complication ([place])  Gestational diabetes    insulin   HSV (herpes simplex virus) anogenital infection    Hypertension    No pertinent past medical history    Ovarian cyst    Pregnancy induced hypertension    procardia     Past Surgical History:  Procedure Laterality Date   BACK SURGERY     CESAREAN SECTION N/A 05/06/2018   Procedure: CESAREAN SECTION;  Surgeon: Christophe Louis, MD;  Location: [place];  Service: Obstetrics;  Laterality: N/A;   DILATION AND CURETTAGE OF UTERUS     INDUCED ABORTION     x2   laporocscopy       Family History  Problem Relation Age of Onset   Hypertension Mother    Asthma Mother     Thyroid disease Mother    Asthma Father    Hypertension Father    Heart disease Father    Hypertension Sister    Heart attack Maternal Grandmother    Heart attack Paternal Grandmother    Diabetes Paternal Grandmother    Stroke Paternal Grandmother    Kidney disease Paternal Grandmother    Thyroid disease Paternal Grandmother    Heart attack Paternal Grandfather    COPD Paternal Grandfather    Anesthesia problems Neg Hx    Hypotension Neg Hx    Malignant hyperthermia Neg Hx    Pseudochol deficiency Neg Hx     Social History   Tobacco Use   Smoking status: Former    Packs/day: .25    Types: Cigarettes, E-cigarettes    Quit date: 02/26/2016    Years since quitting: 6.4   Smokeless tobacco: Never  Vaping Use   Vaping Use: Every day  Substance Use Topics   Alcohol use: Not Currently    Alcohol/week: 1.0 standard drink of alcohol    Types: 1 Glasses of wine per week   Drug use: Never    Review of Systems:   ROS  Objective:   BP 130/80 (BP Location: Left Arm, Patient Position: Sitting, Cuff Size: Large)   Pulse 96   Temp 97.8 F (36.6 C) (Temporal)   Ht 5' 9.5" (1.765 m)   Wt 215 lb 8 oz (97.8 kg)   LMP 07/27/2022 (Exact Date)   Breastfeeding No   BMI 31.37 kg/m  Body mass index is 31.37 kg/m.   General Appearance:    Alert, cooperative, no distress, appears stated age  Head:    Normocephalic, without obvious abnormality, atraumatic  Eyes:    PERRL, conjunctiva/corneas clear, EOM's intact, fundi    benign, both eyes  Ears:    Normal TM's and external ear canals, both ears  Nose:   Nares normal, septum midline, mucosa normal, no drainage    or sinus tenderness  Throat:   Lips, mucosa, and tongue normal; teeth and gums normal  Neck:   Supple, symmetrical, trachea midline, no adenopathy;    thyroid:  no enlargement/tenderness/nodules; no carotid   bruit or JVD  Back:     Symmetric, no curvature, ROM normal, no CVA tenderness  Lungs:     Clear to auscultation  bilaterally, respirations unlabored  Chest Wall:    No tenderness or deformity   Heart:    Regular rate and rhythm, S1 and S2 normal, no murmur, rub or gallop  Breast Exam:    Deferred  Abdomen:     Soft, non-tender, bowel sounds active all four quadrants,    no masses, no organomegaly  Genitalia:    Deferred  Extremities:  Extremities normal, atraumatic, no cyanosis or edema  Pulses:   2+ and symmetric all extremities  Skin:   Skin color, texture, turgor normal, no rashes or lesions  Lymph nodes:   Cervical, supraclavicular, and axillary nodes normal  Neurologic:   CNII-XII intact, normal strength, sensation and reflexes    throughout    Assessment/Plan:   Routine physical examination Today patient counseled on age appropriate routine health concerns for screening and prevention, each reviewed and up to date or declined. Immunizations reviewed and up to date or declined. Labs ordered and reviewed. Risk factors for depression reviewed and negative. Hearing function and visual acuity are intact. ADLs screened and addressed as needed. Functional ability and level of safety reviewed and appropriate. Education, counseling and referrals performed based on assessed risks today. Patient provided with a copy of personalized plan for preventive services.  History of gestational diabetes Update A1c and provide recommendations accordingly  Essential hypertension Normotensive Continue amlodipine 5 mg daily Recommend smoking reduction Discouraged further testosterone use Continue to work with psychiatry on ADHD medications that avoid increase in BP Cardiology referral per patient request  Encounter for lipid screening for cardiovascular disease Update lipid panel  Encounter for screening for other viral diseases Update Hep C  Obesity, unspecified classification, unspecified obesity type, unspecified whether serious comorbidity present Continue healthy lifestyle efforts  I,Rachel  Rivera,acting as a scribe for [employer], PA.,have documented all relevant documentation on the behalf of Inda Coke, PA,as directed by  Inda Coke, PA while in the presence of Inda Coke, [place].  I, Inda Coke, [place], have reviewed all documentation for this visit. The documentation on 08/06/22 for the exam, diagnosis, procedures, and orders are all accurate and complete.   Inda Coke, PA-C [place]

## 2022-08-06 NOTE — Patient Instructions (Addendum)
It was great to see you!  Cardiology referral placed today  Please go to the lab for blood work.   Our office will call you with your results unless you have chosen to receive results via MyChart.  If your blood work is normal we will follow-up each year for physicals and as scheduled for chronic medical problems.  If anything is abnormal we will treat accordingly and get you in for a follow-up.  Take care,  Aldona Bar

## 2022-08-07 ENCOUNTER — Encounter: Payer: Self-pay | Admitting: Physician Assistant

## 2022-08-07 LAB — HEPATITIS C ANTIBODY: Hepatitis C Ab: NONREACTIVE

## 2022-08-10 ENCOUNTER — Encounter: Payer: Self-pay | Admitting: Physician Assistant

## 2022-08-11 ENCOUNTER — Other Ambulatory Visit: Payer: Self-pay | Admitting: Physician Assistant

## 2022-08-11 DIAGNOSIS — R718 Other abnormality of red blood cells: Secondary | ICD-10-CM

## 2022-08-13 DIAGNOSIS — F329 Major depressive disorder, single episode, unspecified: Secondary | ICD-10-CM | POA: Diagnosis not present

## 2022-08-13 DIAGNOSIS — F909 Attention-deficit hyperactivity disorder, unspecified type: Secondary | ICD-10-CM | POA: Diagnosis not present

## 2022-08-13 DIAGNOSIS — F419 Anxiety disorder, unspecified: Secondary | ICD-10-CM | POA: Diagnosis not present

## 2022-08-26 ENCOUNTER — Encounter: Payer: Self-pay | Admitting: Internal Medicine

## 2022-08-26 ENCOUNTER — Ambulatory Visit: Payer: BLUE CROSS/BLUE SHIELD | Attending: Cardiovascular Disease | Admitting: Internal Medicine

## 2022-08-26 VITALS — BP 138/91 | HR 82 | Ht 69.0 in | Wt 219.0 lb

## 2022-08-26 DIAGNOSIS — R0683 Snoring: Secondary | ICD-10-CM | POA: Diagnosis not present

## 2022-08-26 DIAGNOSIS — I10 Essential (primary) hypertension: Secondary | ICD-10-CM

## 2022-08-26 NOTE — Patient Instructions (Signed)
Medication Instructions:  No changes *If you need a refill on your cardiac medications before your next appointment, please call your pharmacy*  Follow-Up: At [facility], you and your health needs are our priority.  As part of our continuing mission to provide you with exceptional heart care, we have created designated Provider Care Teams.  These Care Teams include your primary Cardiologist (physician) and Advanced Practice Providers (APPs -  Physician Assistants and Nurse Practitioners) who all work together to provide you with the care you need, when you need it.  We recommend signing up for the patient portal called "MyChart".  Sign up information is provided on this After Visit Summary.  MyChart is used to connect with patients for Virtual Visits (Telemedicine).  Patients are able to view lab/test results, encounter notes, upcoming appointments, etc.  Non-urgent messages can be sent to your provider as well.   To learn more about what you can do with MyChart, go to [email or web address].    Your next appointment:   6 month(s)  Provider:   Dr Wyline Mood   Other Instructions Referral to pulmonology for sleep study

## 2022-08-26 NOTE — Progress Notes (Signed)
Cardiology Office Note:    Date:  08/26/2022   ID:  [name], DOB January 02, 1986, MRN [id number]  PCP:  Jarold Motto, PA   [facility] Providers Cardiologist:  None     Referring MD: Jarold Motto, PA   No chief complaint on file. HTN  History of Present Illness:    [name] is a 37 y.o. female with a hx of asthma, HTN, DM2, referral for HTN from [facility] ED. She came in due to elevated blood pressure readings. She has hx of gestational HTN. No prior HTN. Her blood pressure was 142/91 mmHg. She states at home her BP was in the 150s/98 to 100 mg. She has a lot of stress at work. Mother, father and sisters have HTN. MGM, MGF with hx of heart disease.   Past Medical History:  Diagnosis Date   Asthma    Complication of anesthesia    trouble waking up   Diabetes mellitus without complication ([facility])    Gestational diabetes    insulin   HSV (herpes simplex virus) anogenital infection    Hypertension    No pertinent past medical history    Ovarian cyst    Pregnancy induced hypertension    procardia    Past Surgical History:  Procedure Laterality Date   BACK SURGERY     CESAREAN SECTION N/A 05/06/2018   Procedure: CESAREAN SECTION;  Surgeon: Gerald Leitz, MD;  Location: [facility];  Service: Obstetrics;  Laterality: N/A;   DILATION AND CURETTAGE OF UTERUS     INDUCED ABORTION     x2   laporocscopy      Current Medications: Current Outpatient Medications on File Prior to Visit  Medication Sig Dispense Refill   acetaminophen (TYLENOL) 325 MG tablet Take 2 tablets (650 mg total) by mouth every 6 (six) hours as needed for moderate pain (Take 1-2 tablets every six hours as needed for pain). 30 tablet 0   amLODipine (NORVASC) 5 MG tablet Take 5 mg by mouth daily.     FLUoxetine (PROZAC) 20 MG capsule Take 20 mg by mouth daily.     methylphenidate 27 MG PO CR tablet Take 27 mg by mouth daily.     Testosterone 200 MG PLLT Take by implantation  route.     triamcinolone cream (KENALOG) 0.5 % Apply 1 Application topically 2 (two) times daily.     No current facility-administered medications on file prior to visit.     Allergies:   Latex, Morphine and related, and Penicillins   Social History   Socioeconomic History   Marital status: Single    Spouse name: Not on file   Number of children: 0   Years of education: Not on file   Highest education level: Bachelor's degree (e.g., BA, AB, BS)  Occupational History   Not on file  Tobacco Use   Smoking status: Former    Packs/day: .25    Types: Cigarettes, E-cigarettes    Quit date: 02/26/2016    Years since quitting: 6.5   Smokeless tobacco: Never  Vaping Use   Vaping Use: Every day  Substance and Sexual Activity   Alcohol use: Not Currently    Alcohol/week: 1.0 standard drink of alcohol    Types: 1 Glasses of wine per week   Drug use: Never   Sexual activity: Yes    Birth control/protection: None  Other Topics Concern   Not on file  Social History Narrative   Works from home with  [employer]   Social Determinants of Health   Financial Resource Strain: Low Risk  (04/21/2018)   Overall Financial Resource Strain (CARDIA)    Difficulty of Paying Living Expenses: Not hard at all  Food Insecurity: No Food Insecurity (04/21/2018)   Hunger Vital Sign    Worried About Running Out of Food in the Last Year: Never true    Ran Out of Food in the Last Year: Never true  Transportation Needs: Unknown (05/04/2018)   PRAPARE - Administrator, Civil Service (Medical): Not on file    Lack of Transportation (Non-Medical): No  Physical Activity: Sufficiently Active (05/04/2018)   Exercise Vital Sign    Days of Exercise per Week: 5 days    Minutes of Exercise per Session: 30 min  Stress: No Stress Concern Present (04/21/2018)   [employer] of Occupational Health - Occupational Stress Questionnaire    Feeling of Stress : Only a little  Social Connections:  Moderately Integrated (05/04/2018)   Social Connection and Isolation Panel [NHANES]    Frequency of Communication with Friends and Family: Three times a week    Frequency of Social Gatherings with Friends and Family: Twice a week    Attends Religious Services: More than 4 times per year    Active Member of [employer] or Organizations: Yes    Attends Engineer, structural: More than 4 times per year    Marital Status: Never married     Family History: The patient's family history includes Asthma in her father and mother; COPD in her paternal grandfather; Diabetes in her paternal grandmother; Heart attack in her maternal grandmother, paternal grandfather, and paternal grandmother; Heart disease in her father; Hypertension in her father, mother, and sister; Kidney disease in her paternal grandmother; Stroke in her paternal grandmother; Thyroid disease in her mother and paternal grandmother. There is no history of Anesthesia problems, Hypotension, Malignant hyperthermia, or Pseudochol deficiency.  ROS:   Please see the history of present illness.     All other systems reviewed and are negative.  EKGs/Labs/Other Studies Reviewed:    The following studies were reviewed today:   EKG:  EKG is  ordered today.  The ekg ordered today demonstrates   08/26/2022- NSR  Recent Labs: 08/06/2022: ALT 17; BUN 11; Creatinine, Ser 1.13; Hemoglobin 14.9; Platelets 430.0; Potassium 4.1; Sodium 136   Recent Lipid Panel    Component Value Date/Time   CHOL 283 (H) 08/06/2022 1118   TRIG 175.0 (H) 08/06/2022 1118   HDL 42.60 08/06/2022 1118   CHOLHDL 7 08/06/2022 1118   VLDL 35.0 08/06/2022 1118   LDLCALC 205 (H) 08/06/2022 1118     Risk Assessment/Calculations:     Physical Exam:    VS:   Vitals:   08/26/22 1421  BP: (!) 138/91  Pulse: 82  SpO2: 99%     LMP 07/27/2022 (Exact Date)     Wt Readings from Last 3 Encounters:  08/06/22 215 lb 8 oz (97.8 kg)  07/16/22 219 lb (99.3 kg)   06/13/21 240 lb 15.4 oz (109.3 kg)     GEN:  Well nourished, well developed in no acute distress HEENT: Normal NECK: No JVD; No carotid bruits CARDIAC: RRR, no murmurs, rubs, gallops RESPIRATORY:  Clear to auscultation without rales, wheezing or rhonchi  ABDOMEN: Soft, non-tender, non-distended MUSCULOSKELETAL:  No edema; No deformity  SKIN: Warm and dry NEUROLOGIC:  Alert and oriented x 3 PSYCHIATRIC:  Normal affect   ASSESSMENT:    HTN: has +  family hx, notes snoring, OSA may contribute - ambulatory monitoring typically < 130/ 80 mmHg - continue norvasc 5 mg  - can add losartan 50 mg daily if home Bps are not at goal.  - send referral for sleep study  - she can undergo her cervical ablation, she is acceptable cardiac risk  PLAN:    In order of problems listed above:    sleep study referral  Follow up 6 months      Medication Adjustments/Labs and Tests Ordered: Current medicines are reviewed at length with the patient today.  Concerns regarding medicines are outlined above.  No orders of the defined types were placed in this encounter.  No orders of the defined types were placed in this encounter.   There are no Patient Instructions on file for this visit.   Signed, Maisie Fus, MD  08/26/2022 9:09 AM    [facility]

## 2022-08-27 DIAGNOSIS — Z01419 Encounter for gynecological examination (general) (routine) without abnormal findings: Secondary | ICD-10-CM | POA: Diagnosis not present

## 2022-08-27 DIAGNOSIS — Z114 Encounter for screening for human immunodeficiency virus [HIV]: Secondary | ICD-10-CM | POA: Diagnosis not present

## 2022-08-27 DIAGNOSIS — N920 Excessive and frequent menstruation with regular cycle: Secondary | ICD-10-CM | POA: Diagnosis not present

## 2022-08-27 DIAGNOSIS — Z1159 Encounter for screening for other viral diseases: Secondary | ICD-10-CM | POA: Diagnosis not present

## 2022-08-27 DIAGNOSIS — Z113 Encounter for screening for infections with a predominantly sexual mode of transmission: Secondary | ICD-10-CM | POA: Diagnosis not present

## 2022-08-31 LAB — HM PAP SMEAR
Chlamydia, Swab/Urine, PCR: NEGATIVE
HPV, high-risk: NEGATIVE

## 2022-09-03 ENCOUNTER — Encounter: Payer: Self-pay | Admitting: Physician Assistant

## 2022-09-03 ENCOUNTER — Other Ambulatory Visit: Payer: Self-pay | Admitting: Obstetrics and Gynecology

## 2022-09-04 ENCOUNTER — Ambulatory Visit: Payer: Medicaid Other | Admitting: Cardiovascular Disease

## 2022-09-04 ENCOUNTER — Encounter (HOSPITAL_BASED_OUTPATIENT_CLINIC_OR_DEPARTMENT_OTHER): Payer: Self-pay | Admitting: Obstetrics and Gynecology

## 2022-09-09 ENCOUNTER — Encounter (HOSPITAL_BASED_OUTPATIENT_CLINIC_OR_DEPARTMENT_OTHER): Payer: Self-pay | Admitting: Obstetrics and Gynecology

## 2022-09-09 NOTE — Progress Notes (Signed)
Spoke w/ via phone for pre-op interview--- pt Lab needs dos---- cbc, urine preg, istat              Lab results------ current EKG in epic/ chart COVID test -----patient states asymptomatic no test needed Arrive at ------- 0815 on 09-18-2022 NPO after MN NO Solid Food.  Clear liquids from MN until--- 0715 Med rec completed Medications to take morning of surgery ----- prozac, norvasc Diabetic medication ----- n/a Patient instructed no nail polish to be worn day of surgery Patient instructed to bring photo id and insurance card day of surgery Patient aware to have Driver (ride ) / caregiver    for 24 hours after surgery -- sig other, justin Patient Special Instructions ----- asked to bring rescue inhaler dos Pre-Op special Instructions ----- n/a Patient verbalized understanding of instructions that were given at this phone interview. Patient denies shortness of breath, chest pain, fever, cough at this phone interview.

## 2022-09-14 ENCOUNTER — Institutional Professional Consult (permissible substitution): Payer: BLUE CROSS/BLUE SHIELD | Admitting: Primary Care

## 2022-09-18 ENCOUNTER — Encounter (HOSPITAL_BASED_OUTPATIENT_CLINIC_OR_DEPARTMENT_OTHER)
Admission: RE | Disposition: A | Discharge status: Home / Self Care | Payer: Self-pay | Source: Home / Self Care | Attending: Obstetrics and Gynecology

## 2022-09-18 ENCOUNTER — Encounter (HOSPITAL_BASED_OUTPATIENT_CLINIC_OR_DEPARTMENT_OTHER): Payer: Self-pay | Admitting: Obstetrics and Gynecology

## 2022-09-18 ENCOUNTER — Ambulatory Visit (HOSPITAL_BASED_OUTPATIENT_CLINIC_OR_DEPARTMENT_OTHER)
Admission: RE | Admit: 2022-09-18 | Discharge: 2022-09-18 | Disposition: A | Discharge status: Home / Self Care | Payer: BLUE CROSS/BLUE SHIELD | Attending: Obstetrics and Gynecology | Admitting: Obstetrics and Gynecology

## 2022-09-18 ENCOUNTER — Other Ambulatory Visit: Payer: Self-pay

## 2022-09-18 ENCOUNTER — Ambulatory Visit (HOSPITAL_BASED_OUTPATIENT_CLINIC_OR_DEPARTMENT_OTHER): Payer: BLUE CROSS/BLUE SHIELD | Admitting: Anesthesiology

## 2022-09-18 DIAGNOSIS — N920 Excessive and frequent menstruation with regular cycle: Secondary | ICD-10-CM | POA: Diagnosis not present

## 2022-09-18 DIAGNOSIS — Z79899 Other long term (current) drug therapy: Secondary | ICD-10-CM | POA: Diagnosis not present

## 2022-09-18 DIAGNOSIS — F909 Attention-deficit hyperactivity disorder, unspecified type: Secondary | ICD-10-CM | POA: Insufficient documentation

## 2022-09-18 DIAGNOSIS — Z302 Encounter for sterilization: Secondary | ICD-10-CM | POA: Insufficient documentation

## 2022-09-18 DIAGNOSIS — Z01818 Encounter for other preprocedural examination: Secondary | ICD-10-CM

## 2022-09-18 DIAGNOSIS — I10 Essential (primary) hypertension: Secondary | ICD-10-CM | POA: Insufficient documentation

## 2022-09-18 HISTORY — DX: Presence of spectacles and contact lenses: Z97.3

## 2022-09-18 HISTORY — DX: Personal history of gestational diabetes: Z86.32

## 2022-09-18 HISTORY — DX: Other chest pain: R07.89

## 2022-09-18 HISTORY — DX: Attention-deficit hyperactivity disorder, unspecified type: F90.9

## 2022-09-18 HISTORY — PX: TUBAL LIGATION: SHX77

## 2022-09-18 HISTORY — PX: ENDOMETRIAL ABLATION: SHX621

## 2022-09-18 HISTORY — DX: Excessive and frequent menstruation with regular cycle: N92.0

## 2022-09-18 HISTORY — DX: Personal history of other complications of pregnancy, childbirth and the puerperium: Z87.59

## 2022-09-18 LAB — CBC
HCT: 41 % (ref 36.0–46.0)
Hemoglobin: 13.3 g/dL (ref 12.0–15.0)
MCH: 28.7 pg (ref 26.0–34.0)
MCHC: 32.4 g/dL (ref 30.0–36.0)
MCV: 88.4 fL (ref 80.0–100.0)
Platelets: 358 10*3/uL (ref 150–400)
RBC: 4.64 MIL/uL (ref 3.87–5.11)
RDW: 15 % (ref 11.5–15.5)
WBC: 6.4 10*3/uL (ref 4.0–10.5)
nRBC: 0 % (ref 0.0–0.2)

## 2022-09-18 LAB — POCT I-STAT, CHEM 8
BUN: 9 mg/dL (ref 6–20)
Calcium, Ion: 1.16 mmol/L (ref 1.15–1.40)
Chloride: 103 mmol/L (ref 98–111)
Creatinine, Ser: 0.9 mg/dL (ref 0.44–1.00)
Glucose, Bld: 94 mg/dL (ref 70–99)
HCT: 42 % (ref 36.0–46.0)
Hemoglobin: 14.3 g/dL (ref 12.0–15.0)
Potassium: 3.9 mmol/L (ref 3.5–5.1)
Sodium: 138 mmol/L (ref 135–145)
TCO2: 23 mmol/L (ref 22–32)

## 2022-09-18 LAB — POCT PREGNANCY, URINE: Preg Test, Ur: NEGATIVE

## 2022-09-18 SURGERY — ABLATION, ENDOMETRIUM
Anesthesia: General | Site: Uterus

## 2022-09-18 MED ORDER — KETOROLAC TROMETHAMINE 30 MG/ML IJ SOLN
INTRAMUSCULAR | Status: DC | PRN
  Administered 2022-09-18: 30 mg via INTRAVENOUS

## 2022-09-18 MED ORDER — DEXAMETHASONE SODIUM PHOSPHATE 10 MG/ML IJ SOLN
INTRAMUSCULAR | Status: AC
  Filled 2022-09-18: qty 6

## 2022-09-18 MED ORDER — MIDAZOLAM HCL 2 MG/2ML IJ SOLN
INTRAMUSCULAR | Status: DC | PRN
  Administered 2022-09-18: 2 mg via INTRAVENOUS

## 2022-09-18 MED ORDER — DEXAMETHASONE SODIUM PHOSPHATE 4 MG/ML IJ SOLN
INTRAMUSCULAR | Status: DC | PRN
  Administered 2022-09-18: 8 mg via INTRAVENOUS

## 2022-09-18 MED ORDER — ACETAMINOPHEN 500 MG PO TABS
ORAL_TABLET | ORAL | Status: AC
  Filled 2022-09-18: qty 2

## 2022-09-18 MED ORDER — 0.9 % SODIUM CHLORIDE (POUR BTL) OPTIME
TOPICAL | Status: DC | PRN
  Administered 2022-09-18: 500 mL

## 2022-09-18 MED ORDER — FENTANYL CITRATE (PF) 100 MCG/2ML IJ SOLN
INTRAMUSCULAR | Status: DC | PRN
  Administered 2022-09-18: 25 ug via INTRAVENOUS
  Administered 2022-09-18: 50 ug via INTRAVENOUS
  Administered 2022-09-18: 25 ug via INTRAVENOUS
  Administered 2022-09-18: 50 ug via INTRAVENOUS

## 2022-09-18 MED ORDER — PROPOFOL 1000 MG/100ML IV EMUL
INTRAVENOUS | Status: AC
  Filled 2022-09-18: qty 100

## 2022-09-18 MED ORDER — ACETAMINOPHEN 500 MG PO TABS
1000.0000 mg | ORAL_TABLET | Freq: Once | ORAL | Status: AC
  Administered 2022-09-18: 1000 mg via ORAL

## 2022-09-18 MED ORDER — POVIDONE-IODINE 10 % EX SWAB: 2.0000 | Freq: Once | CUTANEOUS | Status: DC

## 2022-09-18 MED ORDER — LIDOCAINE HCL (CARDIAC) PF 100 MG/5ML IV SOSY
PREFILLED_SYRINGE | INTRAVENOUS | Status: DC | PRN
  Administered 2022-09-18: 50 mg via INTRAVENOUS

## 2022-09-18 MED ORDER — IBUPROFEN 800 MG PO TABS: 800.0000 mg | ORAL_TABLET | Freq: Three times a day (TID) | ORAL | 11 refills | Status: DC | PRN

## 2022-09-18 MED ORDER — LIDOCAINE HCL (PF) 2 % IJ SOLN
INTRAMUSCULAR | Status: AC
  Filled 2022-09-18: qty 10

## 2022-09-18 MED ORDER — OXYCODONE HCL 5 MG PO TABS: 5.0000 mg | ORAL_TABLET | Freq: Four times a day (QID) | ORAL | 0 refills | Status: AC | PRN

## 2022-09-18 MED ORDER — PROPOFOL 10 MG/ML IV BOLUS
INTRAVENOUS | Status: DC | PRN
  Administered 2022-09-18: 200 mg via INTRAVENOUS

## 2022-09-18 MED ORDER — ONDANSETRON HCL 4 MG/2ML IJ SOLN
INTRAMUSCULAR | Status: DC | PRN
  Administered 2022-09-18: 4 mg via INTRAVENOUS

## 2022-09-18 MED ORDER — FENTANYL CITRATE (PF) 100 MCG/2ML IJ SOLN
INTRAMUSCULAR | Status: AC
  Filled 2022-09-18: qty 2

## 2022-09-18 MED ORDER — MIDAZOLAM HCL 2 MG/2ML IJ SOLN
INTRAMUSCULAR | Status: AC
  Filled 2022-09-18: qty 2

## 2022-09-18 MED ORDER — LACTATED RINGERS IV SOLN
INTRAVENOUS | Status: DC
  Administered 2022-09-18: 1000 mL via INTRAVENOUS

## 2022-09-18 MED ORDER — ROCURONIUM BROMIDE 10 MG/ML (PF) SYRINGE
PREFILLED_SYRINGE | INTRAVENOUS | Status: AC
  Filled 2022-09-18: qty 10

## 2022-09-18 MED ORDER — PHENYLEPHRINE 80 MCG/ML (10ML) SYRINGE FOR IV PUSH (FOR BLOOD PRESSURE SUPPORT)
PREFILLED_SYRINGE | INTRAVENOUS | Status: AC
  Filled 2022-09-18: qty 20

## 2022-09-18 MED ORDER — BUPIVACAINE HCL (PF) 0.25 % IJ SOLN
INTRAMUSCULAR | Status: DC | PRN
  Administered 2022-09-18: 10 mL

## 2022-09-18 MED ORDER — FENTANYL CITRATE (PF) 100 MCG/2ML IJ SOLN
25.0000 ug | INTRAMUSCULAR | Status: DC | PRN
  Administered 2022-09-18: 25 ug via INTRAVENOUS

## 2022-09-18 MED ORDER — ONDANSETRON HCL 4 MG/2ML IJ SOLN
INTRAMUSCULAR | Status: AC
  Filled 2022-09-18: qty 10

## 2022-09-18 MED ORDER — ROCURONIUM BROMIDE 100 MG/10ML IV SOLN
INTRAVENOUS | Status: DC | PRN
  Administered 2022-09-18: 50 mg via INTRAVENOUS

## 2022-09-18 MED ORDER — SUGAMMADEX SODIUM 200 MG/2ML IV SOLN
INTRAVENOUS | Status: DC | PRN
  Administered 2022-09-18: 200 mg via INTRAVENOUS

## 2022-09-18 SURGICAL SUPPLY — 40 items
ABLATOR SURESOUND NOVASURE (ABLATOR) IMPLANT
ADH SKN CLS APL DERMABOND .7 (GAUZE/BANDAGES/DRESSINGS) ×2
CATH ROBINSON RED A/P 16FR (CATHETERS) ×3 IMPLANT
DERMABOND ADVANCED .7 DNX12 (GAUZE/BANDAGES/DRESSINGS) IMPLANT
DILATOR CANAL MILEX (MISCELLANEOUS) IMPLANT
DRAPE SURG IRRIG POUCH 19X23 (DRAPES) ×3 IMPLANT
DRSG TELFA 3X8 NADH STRL (GAUZE/BANDAGES/DRESSINGS) ×3 IMPLANT
DURAPREP 26ML APPLICATOR (WOUND CARE) ×3 IMPLANT
GAUZE 4X4 16PLY ~~LOC~~+RFID DBL (SPONGE) ×6 IMPLANT
GAUZE PETROLATUM 1 X8 (GAUZE/BANDAGES/DRESSINGS) IMPLANT
GLOVE BIOGEL PI IND STRL 7.0 (GLOVE) ×9 IMPLANT
GLOVE ECLIPSE 6.5 STRL STRAW (GLOVE) ×3 IMPLANT
GOWN STRL REUS W/TWL LRG LVL3 (GOWN DISPOSABLE) ×6 IMPLANT
IRRIG SUCT STRYKERFLOW 2 WTIP (MISCELLANEOUS) ×2
IRRIGATION SUCT STRKRFLW 2 WTP (MISCELLANEOUS) ×3 IMPLANT
IV NS 1000ML (IV SOLUTION) ×2
IV NS 1000ML BAXH (IV SOLUTION) IMPLANT
IV NS IRRIG 3000ML ARTHROMATIC (IV SOLUTION) ×3 IMPLANT
KIT PINK PAD W/HEAD ARE REST (MISCELLANEOUS) ×2
KIT PINK PAD W/HEAD ARM REST (MISCELLANEOUS) ×3 IMPLANT
KIT PROCEDURE FLUENT (KITS) ×3 IMPLANT
KIT TURNOVER CYSTO (KITS) ×3 IMPLANT
LOOP CUTTING BIPOLAR 21FR (ELECTRODE) IMPLANT
NDL INSUFFLATION 14GA 120MM (NEEDLE) ×3 IMPLANT
NEEDLE INSUFFLATION 14GA 120MM (NEEDLE) ×2 IMPLANT
PACK LAPAROSCOPY BASIN (CUSTOM PROCEDURE TRAY) ×3 IMPLANT
PACK VAGINAL MINOR WOMEN LF (CUSTOM PROCEDURE TRAY) ×3 IMPLANT
PAD OB MATERNITY 4.3X12.25 (PERSONAL CARE ITEMS) ×3 IMPLANT
PAD PREP 24X48 CUFFED NSTRL (MISCELLANEOUS) ×3 IMPLANT
PROTECTOR NERVE ULNAR (MISCELLANEOUS) ×6 IMPLANT
SEAL ROD LENS SCOPE MYOSURE (ABLATOR) ×6 IMPLANT
SET TUBE SMOKE EVAC HIGH FLOW (TUBING) ×3 IMPLANT
SLEEVE SCD COMPRESS KNEE MED (STOCKING) ×3 IMPLANT
SUT VIC AB 4-0 PS2 18 (SUTURE) ×3 IMPLANT
SUT VICRYL 0 UR6 27IN ABS (SUTURE) ×3 IMPLANT
TOWEL OR 17X24 6PK STRL BLUE (TOWEL DISPOSABLE) ×3 IMPLANT
TROCAR Z-THREAD BLADED 11X100M (TROCAR) ×3 IMPLANT
TROCAR Z-THREAD BLADED 5X100MM (TROCAR) ×3 IMPLANT
WARMER LAPAROSCOPE (MISCELLANEOUS) ×3 IMPLANT
WATER STERILE IRR 500ML POUR (IV SOLUTION) ×3 IMPLANT

## 2022-09-18 NOTE — H&P (Signed)
[name] is an 37 y.o. female. [id number] SF presents for surgical management of menorrhagia. Pt also desires permanent sterilization. Pt is scheduled for endometrial ablation and laparoscopic tubal ligation using cautery  Pertinent Gynecological History: Menses: flow is excessive with use of 5 pads or tampons on heaviest days Bleeding: menorrhagia Contraception: oral progesterone-only contraceptive DES exposure: denies Blood transfusions: none Sexually transmitted diseases: no past history Previous GYN Procedures:  dx laparoscopy  , D&E Last mammogram:  n/a  Date: n/a Last pap: normal Date: 2024 OB History: G1, P1   Menstrual History: Menarche age: n/a Patient's last menstrual period was 08/29/2022 (exact date).    Past Medical History:  Diagnosis Date   ADHD (attention deficit hyperactivity disorder)    Asthma    Atypical chest pain    ED visit in epic 07-17-2022;  referred and evaluated by cardiologist-- dr m. branch , note in epic 08-26-2022   Complication of anesthesia    trouble waking up   History of gestational diabetes    History of pregnancy induced hypertension    HSV (herpes simplex virus) anogenital infection    Hypertension    Menorrhagia    Wears contact lenses     Past Surgical History:  Procedure Laterality Date   CESAREAN SECTION N/A 05/06/2018   Procedure: CESAREAN SECTION;  Surgeon: Gerald Leitz, MD;  Location: [facility];  Service: Obstetrics;  Laterality: N/A;   DIAGNOSTIC LAPAROSCOPY  05/03/2007   @[facility]  ;  for chronic pelvic pain   LUMBAR FUSION  2017   in [place];  L2--L5    Family History  Problem Relation Age of Onset   Hypertension Mother    Asthma Mother    Thyroid disease Mother    Asthma Father    Hypertension Father    Heart disease Father    Hypertension Sister    Heart attack Maternal Grandmother    Heart attack Paternal Grandmother    Diabetes Paternal Grandmother    Stroke Paternal Grandmother    Kidney disease  Paternal Grandmother    Thyroid disease Paternal Grandmother    Heart attack Paternal Grandfather    COPD Paternal Grandfather    Anesthesia problems Neg Hx    Hypotension Neg Hx    Malignant hyperthermia Neg Hx    Pseudochol deficiency Neg Hx     Social History:  reports that she has never smoked. She has never used smokeless tobacco. She reports current alcohol use. She reports that she does not use drugs.  Allergies:  Allergies  Allergen Reactions   Latex Itching   Morphine And Related Itching    Lower doses OK   Doxycycline Rash   Penicillins Rash and Other (See Comments)    Childhood reaction ( unknown) Has patient had a PCN reaction causing immediate rash, facial/tongue/throat swelling, SOB or lightheadedness with hypotension: Yes Has patient had a PCN reaction causing severe rash involving mucus membranes or skin necrosis: No Has patient had a PCN reaction that required hospitalization: No Has patient had a PCN reaction occurring within the last 10 years: No If all of the above answers are "NO", then may proceed with Cephalosporin use.     Medications Prior to Admission  Medication Sig Dispense Refill Last Dose   albuterol (VENTOLIN HFA) 108 (90 Base) MCG/ACT inhaler Inhale 1-2 puffs into the lungs every 6 (six) hours as needed for wheezing or shortness of breath.   Past Month   amLODipine (NORVASC) 5 MG tablet Take 5 mg by  mouth daily.   09/17/2022   Ferrous Sulfate (IRON) 325 (65 Fe) MG TABS Take 1 tablet by mouth daily after lunch.   Past Week   FLUoxetine (PROZAC) 20 MG capsule Take 20 mg by mouth daily.   09/17/2022   fluticasone (FLONASE) 50 MCG/ACT nasal spray Place 1 spray into both nostrils 2 (two) times daily as needed for allergies or rhinitis.   Past Month   methylphenidate 36 MG PO CR tablet Take 36 mg by mouth daily.   09/17/2022   norethindrone (MICRONOR) 0.35 MG tablet Take 1 tablet by mouth at bedtime.   09/17/2022   triamcinolone cream (KENALOG) 0.5 % Apply 1  Application topically 2 (two) times daily as needed.   Past Month   acetaminophen (TYLENOL) 500 MG tablet Take 500 mg by mouth every 6 (six) hours as needed.   More than a month   Testosterone 200 MG PLLT Take by implantation route.   More than a month    Review of Systems  All other systems reviewed and are negative.   Blood pressure (!) 143/99, pulse 64, temperature 97.9 F (36.6 C), temperature source Oral, resp. rate 16, height 5\' 9"  (1.753 m), weight 99.1 kg, last menstrual period 08/29/2022, SpO2 99 %. Physical Exam Constitutional:      Appearance: Normal appearance. She is obese.  Eyes:     Extraocular Movements: Extraocular movements intact.  Cardiovascular:     Rate and Rhythm: Regular rhythm.     Heart sounds: Normal heart sounds.  Pulmonary:     Breath sounds: Normal breath sounds.  Abdominal:     Comments: Pfannensteil skin incision  Genitourinary:    General: Normal vulva.     Comments: Vagina nl  Cervix nulliparous Uterus AV bulky Adnexa nl Musculoskeletal:        General: No swelling.     Cervical back: Neck supple.  Skin:    General: Skin is warm and dry.  Neurological:     General: No focal deficit present.     Mental Status: She is alert and oriented to person, place, and time.  Psychiatric:        Mood and Affect: Mood normal.        Behavior: Behavior normal.     Results for orders placed or performed during the hospital encounter of 09/18/22 (from the past 24 hour(s))  Pregnancy, urine POC     Status: None   Collection Time: 09/18/22  8:07 AM  Result Value Ref Range   Preg Test, Ur NEGATIVE NEGATIVE  CBC     Status: None   Collection Time: 09/18/22  8:29 AM  Result Value Ref Range   WBC 6.4 4.0 - 10.5 K/uL   RBC 4.64 3.87 - 5.11 MIL/uL   Hemoglobin 13.3 12.0 - 15.0 g/dL   HCT 86.5 78.4 - 69.6 %   MCV 88.4 80.0 - 100.0 fL   MCH 28.7 26.0 - 34.0 pg   MCHC 32.4 30.0 - 36.0 g/dL   RDW 29.5 28.4 - 13.2 %   Platelets 358 150 - 400 K/uL   nRBC  0.0 0.0 - 0.2 %  I-STAT, chem 8     Status: None   Collection Time: 09/18/22  8:39 AM  Result Value Ref Range   Sodium 138 135 - 145 mmol/L   Potassium 3.9 3.5 - 5.1 mmol/L   Chloride 103 98 - 111 mmol/L   BUN 9 6 - 20 mg/dL   Creatinine, Ser 4.40 0.44 -  1.00 mg/dL   Glucose, Bld 94 70 - 99 mg/dL   Calcium, Ion 1.61 0.96 - 1.40 mmol/L   TCO2 23 22 - 32 mmol/L   Hemoglobin 14.3 12.0 - 15.0 g/dL   HCT 04.5 40.9 - 81.1 %    No results found.  Assessment/Plan: Menorrhagia  Desires sterilization P) LTL with cautery, dx hysteroscopy, novasure endometrial ablation. Procedures explained. Risk of surgery reviewed including infection, bleeding, injury to surrounding organ structures, thermal injury, permanence, nonreversible failure rate 1/500-1/600, 90% reduction in flow, fluid overload and its mgmt, possible hematometria in the future. All ? answered  Tamber Burtch A Margaretta Chittum 09/18/2022, 9:06 AM

## 2022-09-18 NOTE — Interval H&P Note (Signed)
History and Physical Interval Note:  09/18/2022 10:19 AM  [name]  has presented today for surgery, with the diagnosis of Menorrhagia, desires sterilization.  The various methods of treatment have been discussed with the patient and family. After consideration of risks, benefits and other options for treatment, the patient has consented to  PROCEDURES: DIAGNOSTIC HYSTEROSCOPY, ENDOMETRIAL ABLATION USING NOVASURE, LAPAROSCOPIC TUBAL LIGATION USING CAUTERY as a surgical intervention.  The patient's history has been reviewed, patient examined, no change in status, stable for surgery.  I have reviewed the patient's chart and labs.  Questions were answered to the patient's satisfaction.     Sabastien Tyler A Elfie Costanza

## 2022-09-18 NOTE — Anesthesia Procedure Notes (Signed)
Procedure Name: Intubation Date/Time: 09/18/2022 10:52 AM  Performed by: Earmon Phoenix, CRNAPre-anesthesia Checklist: Patient identified, Patient being monitored, Timeout performed, Emergency Drugs available and Suction available Patient Re-evaluated:Patient Re-evaluated prior to induction Oxygen Delivery Method: Circle System Utilized Preoxygenation: Pre-oxygenation with 100% oxygen Induction Type: IV induction Ventilation: Mask ventilation without difficulty Laryngoscope Size: Mac and 4 Grade View: Grade II Tube type: Oral Tube size: 7.0 mm Number of attempts: 1 Airway Equipment and Method: stylet Placement Confirmation: ETT inserted through vocal cords under direct vision, positive ETCO2 and breath sounds checked- equal and bilateral Secured at: 21 cm Tube secured with: Tape Dental Injury: Teeth and Oropharynx as per pre-operative assessment

## 2022-09-18 NOTE — Brief Op Note (Signed)
09/18/2022  12:33 PM  PATIENT:  [name]  37 y.o. female  PRE-OPERATIVE DIAGNOSIS:  Menorrhagia, desires sterilization  POST-OPERATIVE DIAGNOSIS:  Menorrhagia, desires sterilization  PROCEDURE:  Procedure(s): ENDOMETRIAL ABLATION WITH NOVASURE (N/A) LAPARASCOPIC BILATERAL TUBAL LIGATION (N/A)  SURGEON:  Surgeon(s) and Role:    * Maxie Better, MD - Primary  PHYSICIAN ASSISTANT:   ASSISTANTS: {ASSISTANTS:31801}   ANESTHESIA:   {procedures; anesthesia:812}  EBL:  20 mL   BLOOD ADMINISTERED:{BLOOD GIVEN TYPES AND AMOUNTS:20467}  DRAINS: {Devices; drains:31758}   LOCAL MEDICATIONS USED:  {LOCAL MEDICATIONS:10721995}  SPECIMEN:  {ONC STAGING; AJCC TYPE OF SPECIMEN:115600001}  DISPOSITION OF SPECIMEN:  {SPECIMEN DISPOSITION:204680}  COUNTS:  {OR COUNTS CORRECT/INCORRECT:204690}  TOURNIQUET:  * No tourniquets in log *  DICTATION: .{DICTATION TYPES:[phone]}  PLAN OF CARE: {OPTIME PLAN OF UJWJ:1914782956}  PATIENT DISPOSITION:  {op note disposition:31782}   Delay start of Pharmacological VTE agent (>24hrs) due to surgical blood loss or risk of bleeding: {YES/NO/NOT APPLICABLE:20182}

## 2022-09-18 NOTE — Anesthesia Preprocedure Evaluation (Addendum)
Anesthesia Evaluation  Patient identified by MRN, date of birth, ID band Patient awake    Reviewed: Allergy & Precautions, NPO status , Patient's Chart, lab work & pertinent test results  Airway Mallampati: II  TM Distance: >3 FB Neck ROM: Full    Dental no notable dental hx. (+) Teeth Intact, Dental Advisory Given   Pulmonary asthma    Pulmonary exam normal breath sounds clear to auscultation       Cardiovascular hypertension, Pt. on medications Normal cardiovascular exam Rhythm:Regular Rate:Normal     Neuro/Psych  PSYCHIATRIC DISORDERS  Depression    negative neurological ROS     GI/Hepatic negative GI ROS, Neg liver ROS,,,  Endo/Other  negative endocrine ROS    Renal/GU negative Renal ROS  negative genitourinary   Musculoskeletal  (+) Arthritis ,    Abdominal   Peds  (+) ADHD Hematology negative hematology ROS (+)   Anesthesia Other Findings   Reproductive/Obstetrics                             Anesthesia Physical Anesthesia Plan  ASA: 2  Anesthesia Plan: General   Post-op Pain Management: Tylenol PO (pre-op)*   Induction: Intravenous  PONV Risk Score and Plan: 3 and Midazolam, Dexamethasone and Ondansetron  Airway Management Planned: Oral ETT  Additional Equipment:   Intra-op Plan:   Post-operative Plan: Extubation in OR  Informed Consent: I have reviewed the patients History and Physical, chart, labs and discussed the procedure including the risks, benefits and alternatives for the proposed anesthesia with the patient or authorized representative who has indicated his/her understanding and acceptance.     Dental advisory given  Plan Discussed with: CRNA  Anesthesia Plan Comments:        Anesthesia Quick Evaluation

## 2022-09-18 NOTE — Discharge Instructions (Signed)
   No acetaminophen/Tylenol until after 2:30 pm today if needed.  No ibuprofen, Advil, Aleve, Motrin, ketorolac, meloxicam, naproxen, or other NSAIDS until after 5:40 pm today if needed.   Post Anesthesia Home Care Instructions  Activity: Get plenty of rest for the remainder of the day. A responsible individual must stay with you for 24 hours following the procedure.  For the next 24 hours, DO NOT: -Drive a car -Advertising copywriter -Drink alcoholic beverages -Take any medication unless instructed by your physician -Make any legal decisions or sign important papers.  Meals: Start with liquid foods such as gelatin or soup. Progress to regular foods as tolerated. Avoid greasy, spicy, heavy foods. If nausea and/or vomiting occur, drink only clear liquids until the nausea and/or vomiting subsides. Call your physician if vomiting continues.  Special Instructions/Symptoms: Your throat may feel dry or sore from the anesthesia or the breathing tube placed in your throat during surgery. If this causes discomfort, gargle with warm salt water. The discomfort should disappear within 24 hours.

## 2022-09-18 NOTE — Transfer of Care (Signed)
Immediate Anesthesia Transfer of Care Note  Patient: [name]  Procedure(s) Performed: ENDOMETRIAL ABLATION WITH NOVASURE (Uterus) LAPARASCOPIC BILATERAL TUBAL LIGATION (Abdomen)  Patient Location: PACU  Anesthesia Type:General  Level of Consciousness: awake, alert , oriented, and patient cooperative  Airway & Oxygen Therapy: Patient Spontanous Breathing and Patient connected to nasal cannula oxygen  Post-op Assessment: Report given to RN and Post -op Vital signs reviewed and stable  Post vital signs: Reviewed and stable  Last Vitals:  Vitals Value Taken Time  BP 152/98 09/18/22 1208  Temp    Pulse 84 09/18/22 1209  Resp 17 09/18/22 1209  SpO2 99 % 09/18/22 1209  Vitals shown include unvalidated device data.  Last Pain:  Vitals:   09/18/22 0816  TempSrc: Oral  PainSc: 0-No pain      Patients Stated Pain Goal: 8 (09/18/22 0816)  Complications: No notable events documented.

## 2022-09-18 NOTE — Anesthesia Postprocedure Evaluation (Signed)
Anesthesia Post Note  Patient: [name]  Procedure(s) Performed: ENDOMETRIAL ABLATION WITH NOVASURE (Uterus) LAPARASCOPIC BILATERAL TUBAL LIGATION (Abdomen)     Patient location during evaluation: PACU Anesthesia Type: General Level of consciousness: awake and alert Pain management: pain level controlled Vital Signs Assessment: post-procedure vital signs reviewed and stable Respiratory status: spontaneous breathing, nonlabored ventilation, respiratory function stable and patient connected to nasal cannula oxygen Cardiovascular status: blood pressure returned to baseline and stable Postop Assessment: no apparent nausea or vomiting Anesthetic complications: no  No notable events documented.  Last Vitals:  Vitals:   09/18/22 1245 09/18/22 1315  BP: (!) 140/98 (!) 143/98  Pulse: 63 60  Resp: 17 15  Temp:  (!) 36.2 C  SpO2: 95% 96%    Last Pain:  Vitals:   09/18/22 1315  TempSrc:   PainSc: 3                  Steele Stracener L Caileen Veracruz

## 2022-09-19 NOTE — Op Note (Signed)
[name], [place] MEDICAL RECORD NO: [id number] ACCOUNT NO: [id number] DATE OF BIRTH: Apr 21, 1986 FACILITY: [facility] LOCATION: [facility] PHYSICIAN: Ericia Moxley A. Cherly Hensen, MD  Operative Report   DATE OF PROCEDURE: 09/18/2022  PREOPERATIVE DIAGNOSES:  Menorrhagia, desires sterilization.  PROCEDURES PERFORMED:  1.  Diagnostic hysteroscopy. 2.  NovaSure endometrial ablation. 3.  Laparoscopic tubal ligation with bipolar cautery.  POSTOPERATIVE DIAGNOSES:  Menorrhagia, desires sterilization.  ANESTHESIA:  General.  SURGEON:  Maxie Better, MD  ASSISTANT:  None.  DESCRIPTION OF PROCEDURE:  Under adequate general anesthesia, the patient was placed in the dorsal lithotomy position.  She was sterilely prepped and draped in the usual fashion.  Indwelling Foley catheter was sterilely placed.  Examination under  anesthesia revealed anteverted uterus, no adnexal masses could be appreciated.  Bivalve speculum was placed in the vagina.  A single-tooth tenaculum was placed on the anterior lip of the cervix.  The uterus sounded to 9 cm.  The endocervical canal,  sounded to 3 cm, giving a uterine length of 6 cm.  The cervix easily accepted a #17 Pratt dilator.  A diagnostic hysteroscope was introduced into the uterine cavity.  Tubal ostia were seen.  No lesions were noted.  Some endometrial thickening was noted  throughout, but unremarkable otherwise.  The NovaSure endometrial apparatus was then inserted.  Width of 4.0 was noted with a power watts of 132, 44 seconds of ablation was accomplished.  The NovaSure apparatus was then removed and the diagnostic  hysteroscope was introduced into the uterine cavity.  Good endometrial ablation was noted throughout.  At that point, the acorn cannula was introduced into the cervical os and attached to the tenaculum for manipulation of the uterus.  Bivalve speculum  was removed.  I then went to the abdomen sterilely.  0.25% Marcaine was injected  supraumbilically.  A small incision was then made.  Veress needle was introduced and tested with sterile water.  Opening pressure of 7 was noted, 3.2 liters of CO2 was  insufflated.  Veress needle was then removed.  A 10 mm disposable trocar with sleeve was introduced into the abdomen without incident.  A lighted video laparoscope was inserted, showed an entry into the abdomen without incident.  Panoramic inspection was  notable for normal liver edge.  There was evidence of the right ovary sitting superiorly and with deep Trendelenburg, the right tube was also identified down to its fimbriated end. In the left lower quadrant, a small incision was made after 0.25%  Marcaine was injected and a 5 mm port was placed under direct visualization.  Using a probe, the pelvis was inspected.  The right tube had a small paratubal cyst, but otherwise unremarkable.  The anterior and posterior cul-de-sac without any lesions.   The left tube was seen, the left ovary was smaller and into the left ovarian fossa. Using the Kleppinger, the bipolar cauterization of the mid portion of the right tube was accomplished.  It was then used to also do the mid portion of the left tube as  well.  When this was felt to be adequate, the left lower quadrant port site was removed under direct visualization.  The abdomen was deflated and the supraumbilical port site was removed, taking care not to bring up any underlying structures.  The fascia  was closed with 0 Vicryl figure-of-eight suture and the skin approximated using 4-0 Vicryl subcuticular closure.  Instruments from the vagina was removed.  SPECIMEN:  None.  ESTIMATED BLOOD LOSS: 10 mL.  COMPLICATIONS:  None.  CONDITION:  The patient tolerated the procedure well and was transferred to recovery room in stable condition.   MUK D: 09/19/2022 6:27:08 am T: 09/19/2022 10:17:00 am  JOB: [id number]/ [id number]

## 2022-09-23 ENCOUNTER — Encounter (HOSPITAL_BASED_OUTPATIENT_CLINIC_OR_DEPARTMENT_OTHER): Payer: Self-pay | Admitting: Obstetrics and Gynecology

## 2022-09-25 DIAGNOSIS — T8149XA Infection following a procedure, other surgical site, initial encounter: Secondary | ICD-10-CM | POA: Diagnosis not present

## 2022-10-15 ENCOUNTER — Ambulatory Visit: Payer: BLUE CROSS/BLUE SHIELD

## 2022-11-10 ENCOUNTER — Ambulatory Visit (INDEPENDENT_AMBULATORY_CARE_PROVIDER_SITE_OTHER): Payer: BLUE CROSS/BLUE SHIELD | Admitting: Physician Assistant

## 2022-11-10 VITALS — BP 140/90 | HR 91 | Temp 97.7°F | Ht 69.0 in | Wt 213.0 lb

## 2022-11-10 DIAGNOSIS — R5383 Other fatigue: Secondary | ICD-10-CM | POA: Diagnosis not present

## 2022-11-10 DIAGNOSIS — F909 Attention-deficit hyperactivity disorder, unspecified type: Secondary | ICD-10-CM | POA: Diagnosis not present

## 2022-11-10 DIAGNOSIS — E559 Vitamin D deficiency, unspecified: Secondary | ICD-10-CM

## 2022-11-10 DIAGNOSIS — R21 Rash and other nonspecific skin eruption: Secondary | ICD-10-CM

## 2022-11-10 DIAGNOSIS — I10 Essential (primary) hypertension: Secondary | ICD-10-CM

## 2022-11-10 DIAGNOSIS — F419 Anxiety disorder, unspecified: Secondary | ICD-10-CM | POA: Diagnosis not present

## 2022-11-10 DIAGNOSIS — Z79890 Hormone replacement therapy (postmenopausal): Secondary | ICD-10-CM | POA: Diagnosis not present

## 2022-11-10 DIAGNOSIS — F329 Major depressive disorder, single episode, unspecified: Secondary | ICD-10-CM | POA: Diagnosis not present

## 2022-11-10 LAB — COMPREHENSIVE METABOLIC PANEL
ALT: 25 U/L (ref 0–35)
AST: 23 U/L (ref 0–37)
Albumin: 4.4 g/dL (ref 3.5–5.2)
Alkaline Phosphatase: 73 U/L (ref 39–117)
BUN: 10 mg/dL (ref 6–23)
CO2: 29 mEq/L (ref 19–32)
Calcium: 9.9 mg/dL (ref 8.4–10.5)
Chloride: 98 mEq/L (ref 96–112)
Creatinine, Ser: 1.03 mg/dL (ref 0.40–1.20)
GFR: 70.06 mL/min (ref >60.00)
Glucose, Bld: 85 mg/dL (ref 70–99)
Potassium: 5 mEq/L (ref 3.5–5.1)
Sodium: 136 mEq/L (ref 135–145)
Total Bilirubin: 0.6 mg/dL (ref 0.2–1.2)
Total Protein: 7.6 g/dL (ref 6.0–8.3)

## 2022-11-10 LAB — CBC WITH DIFFERENTIAL/PLATELET
Basophils Absolute: 0.1 10*3/uL (ref 0.0–0.1)
Basophils Relative: 0.8 % (ref 0.0–3.0)
Eosinophils Absolute: 0 10*3/uL (ref 0.0–0.7)
Eosinophils Relative: 0.7 % (ref 0.0–5.0)
HCT: 46.5 % — ABNORMAL HIGH (ref 36.0–46.0)
Hemoglobin: 15.6 g/dL — ABNORMAL HIGH (ref 12.0–15.0)
Lymphocytes Relative: 40.6 % (ref 12.0–46.0)
Lymphs Abs: 2.5 10*3/uL (ref 0.7–4.0)
MCHC: 33.6 g/dL (ref 30.0–36.0)
MCV: 89.5 fl (ref 78.0–100.0)
Monocytes Absolute: 0.3 10*3/uL (ref 0.1–1.0)
Monocytes Relative: 5 % (ref 3.0–12.0)
Neutro Abs: 3.3 10*3/uL (ref 1.4–7.7)
Neutrophils Relative %: 52.9 % (ref 43.0–77.0)
Platelets: 362 10*3/uL (ref 150.0–400.0)
RBC: 5.19 Mil/uL — ABNORMAL HIGH (ref 3.87–5.11)
RDW: 15.2 % (ref 11.5–15.5)
WBC: 6.2 10*3/uL (ref 4.0–10.5)

## 2022-11-10 LAB — URINALYSIS, ROUTINE W REFLEX MICROSCOPIC
Bilirubin Urine: NEGATIVE
Hgb urine dipstick: NEGATIVE
Ketones, ur: NEGATIVE
Leukocytes,Ua: NEGATIVE
Nitrite: NEGATIVE
Specific Gravity, Urine: 1.02 (ref 1.000–1.030)
Total Protein, Urine: NEGATIVE
Urine Glucose: NEGATIVE
Urobilinogen, UA: 0.2 (ref 0.0–1.0)
pH: 8 (ref 5.0–8.0)

## 2022-11-10 LAB — VITAMIN D 25 HYDROXY (VIT D DEFICIENCY, FRACTURES): VITD: 29.21 ng/mL — ABNORMAL LOW (ref 30.00–100.00)

## 2022-11-10 LAB — IBC + FERRITIN
Ferritin: 14.7 ng/mL (ref 10.0–291.0)
Iron: 130 ug/dL (ref 42–145)
Saturation Ratios: 26.6 % (ref 20.0–50.0)
TIBC: 488.6 ug/dL — ABNORMAL HIGH (ref 250.0–450.0)
Transferrin: 349 mg/dL (ref 212.0–360.0)

## 2022-11-10 LAB — TESTOSTERONE: Testosterone: 16.54 ng/dL (ref 15.00–40.00)

## 2022-11-10 LAB — VITAMIN B12: Vitamin B-12: 457 pg/mL (ref 211–911)

## 2022-11-10 MED ORDER — KETOCONAZOLE 2 % EX CREA: TOPICAL_CREAM | CUTANEOUS | 1 refills | Status: DC

## 2022-11-10 NOTE — Patient Instructions (Signed)
It was great to see you!  Keep an eye on your blood pressure for [place] and let me know if it stays above 130/90  I will be in touch with your blood work results  Apply ketoconazole to rash and keep [place] posted  Take care,  Jarold Motto PA-C

## 2022-11-10 NOTE — Progress Notes (Signed)
[name] is a 37 y.o. female here for a new problem.  History of Present Illness:   Chief Complaint  Patient presents with   Fatigue    Pt c/o feeling very tired past 2 weeks.    HPI  Fatigue: She complains of fatigue for the past 2 weeks.  She is having stress from work that is impacting her greatly per her report. She is planning to follow-up with her weight loss provider who prescribes her testosterone -- she states that she is close to due for this She is UpToDate on all cancer screenings She is trying to exercise regularly and get adequate sleep  Rash: She complains of itching rash on her lower abdomen. She is applying cream (triamcinolone) to manage her symptoms but found no relief.   Iron:  She continue taking iron supplements and is not taking them every other day since her recent surgery.  She denies having constipation while taking them.   Blood pressure: Her blood pressure is elevated during this visit.  She has not taken amlodipine for the past month and reports no new issues while taking it.  BP Readings from Last 3 Encounters:  11/10/22 (!) 138/90  09/18/22 (!) 143/98  08/26/22 (!) 138/91   ADHD She changed her methylphenidate to 54 mg daily PO.  She had to briefly stop taking it due to a national shortage causing supply to run out.  She has decreased appetite while taking it.      Past Medical History:  Diagnosis Date   ADHD (attention deficit hyperactivity disorder)    Asthma    Atypical chest pain    ED visit in epic 07-17-2022;  referred and evaluated by cardiologist-- dr m. branch , note in epic 08-26-2022   Complication of anesthesia    trouble waking up   History of gestational diabetes    History of pregnancy induced hypertension    HSV (herpes simplex virus) anogenital infection    Hypertension    Menorrhagia    Wears contact lenses      Social History   Tobacco Use   Smoking status: Never   Smokeless tobacco: Never   Vaping Use   Vaping Use: Every day   Substances: Nicotine, Flavoring   Devices: geek  Substance Use Topics   Alcohol use: Yes    Comment: socially   Drug use: Never    Past Surgical History:  Procedure Laterality Date   CESAREAN SECTION N/A 05/06/2018   Procedure: CESAREAN SECTION;  Surgeon: Gerald Leitz, MD;  Location: [facility]IRTHING SU[facility];  Service: Obstetrics;  Laterality: N/A;   DIAGNOSTIC LAPAROSCOPY  05/03/2007   @[facility]  ;  for chronic pelvic pain   ENDOMETRIAL ABLATION N/A 09/18/2022   Procedure: ENDOMETRIAL ABLATION WITH NOVASURE;  Surgeon: Maxie Better, MD;  Location: [facility];  Service: Gynecology;  Laterality: N/A;   LUMBAR FUSION  2017   in [place];  L2--L5   TUBAL LIGATION N/A 09/18/2022   Procedure: LAPARASCOPIC BILATERAL TUBAL LIGATION;  Surgeon: Maxie Better, MD;  Location: [facility];  Service: Gynecology;  Laterality: N/A;    Family History  Problem Relation Age of Onset   Hypertension Mother    Asthma Mother    Thyroid disease Mother    Asthma Father    Hypertension Father    Heart disease Father    Hypertension Sister    Heart attack Maternal Grandmother    Heart attack Paternal Grandmother    Diabetes Paternal Grandmother  Stroke Paternal Grandmother    Kidney disease Paternal Grandmother    Thyroid disease Paternal Grandmother    Heart attack Paternal Grandfather    COPD Paternal Grandfather    Anesthesia problems Neg Hx    Hypotension Neg Hx    Malignant hyperthermia Neg Hx    Pseudochol deficiency Neg Hx     Allergies  Allergen Reactions   Latex Itching   Morphine And Codeine Itching    Lower doses OK   Doxycycline Rash   Penicillins Rash and Other (See Comments)    Childhood reaction ( unknown) Has patient had a PCN reaction causing immediate rash, facial/tongue/throat swelling, SOB or lightheadedness with hypotension: Yes Has patient had a PCN reaction causing severe rash involving mucus  membranes or skin necrosis: No Has patient had a PCN reaction that required hospitalization: No Has patient had a PCN reaction occurring within the last 10 years: No If all of the above answers are "NO", then may proceed with Cephalosporin use.     Current Medications:   Current Outpatient Medications:    acetaminophen (TYLENOL) 500 MG tablet, Take 500 mg by mouth every 6 (six) hours as needed., Disp: , Rfl:    albuterol (VENTOLIN HFA) 108 (90 Base) MCG/ACT inhaler, Inhale 1-2 puffs into the lungs every 6 (six) hours as needed for wheezing or shortness of breath., Disp: , Rfl:    Ferrous Sulfate (IRON) 325 (65 Fe) MG TABS, Take 1 tablet by mouth daily after lunch., Disp: , Rfl:    FLUoxetine (PROZAC) 20 MG capsule, Take 20 mg by mouth daily., Disp: , Rfl:    fluticasone (FLONASE) 50 MCG/ACT nasal spray, Place 1 spray into both nostrils 2 (two) times daily as needed for allergies or rhinitis., Disp: , Rfl:    methylphenidate 54 MG PO CR tablet, Take 54 mg by mouth daily., Disp: , Rfl:    Testosterone 200 MG PLLT, Take by implantation route., Disp: , Rfl:    triamcinolone cream (KENALOG) 0.5 %, Apply 1 Application topically 2 (two) times daily as needed., Disp: , Rfl:    amLODipine (NORVASC) 5 MG tablet, Take 5 mg by mouth daily. (Patient not taking: Reported on 11/10/2022), Disp: , Rfl:    Review of Systems:   Review of Systems  Constitutional:  Positive for malaise/fatigue.  Gastrointestinal:  Negative for constipation.  Skin:  Positive for rash (itching rash over panis area).    Vitals:   Vitals:   11/10/22 1057  BP: (!) 138/90  Pulse: 91  Temp: 97.7 F (36.5 C)  TempSrc: Temporal  SpO2: 90%  Weight: 213 lb (96.6 kg)  Height: 5\' 9"  (1.753 m)     Body mass index is 31.45 kg/m.  Physical Exam:   Physical Exam Vitals and nursing note reviewed.  Constitutional:      General: She is not in acute distress.    Appearance: She is well-developed. She is not ill-appearing or  toxic-appearing.  Cardiovascular:     Rate and Rhythm: Normal rate and regular rhythm.     Pulses: Normal pulses.     Heart sounds: Normal heart sounds, S1 normal and S2 normal.  Pulmonary:     Effort: Pulmonary effort is normal.     Breath sounds: Normal breath sounds.  Skin:    General: Skin is warm and dry.  Neurological:     Mental Status: She is alert.     GCS: GCS eye subscore is 4. GCS verbal subscore is 5. GCS motor subscore  is 6.  Psychiatric:        Speech: Speech normal.        Behavior: Behavior normal. Behavior is cooperative.     Assessment and Plan:   Essential hypertension Above goal today No evidence of end-organ damage on my exam Recommend patient monitor home blood pressure at least a few times weekly If BP consistently > 130/80, recommend restarting amlodipine 5 mg daily If home monitoring shows consistent elevation, or any symptom(s) develop, recommend reach out to [place] for further advice on next steps  Fatigue, unspecified type No red flags Update blood work today to rule out organic cause of symptom(s) Fax results to [facility] Unfortunately urine pregnancy test was unable to be added  -- recommend that she performs home UPT to rule out pregnancy and follow-up with gynecology if positive Follow-up based on results and clinical symptom(s)  Vitamin D deficiency Update Vitamin D and provide recommendations accordingly  Hormone replacement therapy (HRT) Update blood work today per patient request and will fwd results to [facility]  Rash and nonspecific skin eruption Suspect possible intertrigo Recommend ketoconazole  - sent in, if no improvement, recommend reaching out to [place]  [employer] as a scribe for [employer], PA.,have documented all relevant documentation on the behalf of Jarold Motto, PA,as directed by  Jarold Motto, PA while in the presence of Jarold Motto, [place].  I, Jarold Motto, [place], have reviewed all documentation for this  visit. The documentation on 11/10/22 for the exam, diagnosis, procedures, and orders are all accurate and complete.  Jarold Motto, PA-C

## 2022-11-25 DIAGNOSIS — Z6831 Body mass index (BMI) 31.0-31.9, adult: Secondary | ICD-10-CM | POA: Diagnosis not present

## 2022-11-25 DIAGNOSIS — E611 Iron deficiency: Secondary | ICD-10-CM | POA: Diagnosis not present

## 2022-11-27 ENCOUNTER — Encounter: Payer: Self-pay | Admitting: Physician Assistant

## 2022-11-27 DIAGNOSIS — N951 Menopausal and female climacteric states: Secondary | ICD-10-CM | POA: Diagnosis not present

## 2022-11-27 DIAGNOSIS — R5383 Other fatigue: Secondary | ICD-10-CM | POA: Diagnosis not present

## 2022-11-27 DIAGNOSIS — G479 Sleep disorder, unspecified: Secondary | ICD-10-CM | POA: Diagnosis not present

## 2022-11-27 DIAGNOSIS — Z79890 Hormone replacement therapy (postmenopausal): Secondary | ICD-10-CM | POA: Diagnosis not present

## 2022-11-30 DIAGNOSIS — G5603 Carpal tunnel syndrome, bilateral upper limbs: Secondary | ICD-10-CM | POA: Diagnosis not present

## 2022-12-01 ENCOUNTER — Ambulatory Visit (INDEPENDENT_AMBULATORY_CARE_PROVIDER_SITE_OTHER): Payer: BLUE CROSS/BLUE SHIELD | Admitting: Physician Assistant

## 2022-12-01 ENCOUNTER — Encounter: Payer: Self-pay | Admitting: Physician Assistant

## 2022-12-01 VITALS — BP 126/88 | HR 76 | Temp 97.8°F | Ht 69.0 in | Wt 214.2 lb

## 2022-12-01 DIAGNOSIS — R5383 Other fatigue: Secondary | ICD-10-CM | POA: Diagnosis not present

## 2022-12-01 DIAGNOSIS — Z832 Family history of diseases of the blood and blood-forming organs and certain disorders involving the immune mechanism: Secondary | ICD-10-CM | POA: Diagnosis not present

## 2022-12-01 DIAGNOSIS — I10 Essential (primary) hypertension: Secondary | ICD-10-CM | POA: Diagnosis not present

## 2022-12-01 DIAGNOSIS — H1132 Conjunctival hemorrhage, left eye: Secondary | ICD-10-CM | POA: Diagnosis not present

## 2022-12-01 DIAGNOSIS — Z79890 Hormone replacement therapy (postmenopausal): Secondary | ICD-10-CM

## 2022-12-01 LAB — CBC WITH DIFFERENTIAL/PLATELET
Basophils Absolute: 0 10*3/uL (ref 0.0–0.1)
Basophils Relative: 0.6 % (ref 0.0–3.0)
Eosinophils Absolute: 0.1 10*3/uL (ref 0.0–0.7)
Eosinophils Relative: 1.3 % (ref 0.0–5.0)
HCT: 46.4 % — ABNORMAL HIGH (ref 36.0–46.0)
Hemoglobin: 15.4 g/dL — ABNORMAL HIGH (ref 12.0–15.0)
Lymphocytes Relative: 42.8 % (ref 12.0–46.0)
Lymphs Abs: 2.8 10*3/uL (ref 0.7–4.0)
MCHC: 33.1 g/dL (ref 30.0–36.0)
MCV: 90.8 fl (ref 78.0–100.0)
Monocytes Absolute: 0.5 10*3/uL (ref 0.1–1.0)
Monocytes Relative: 6.9 % (ref 3.0–12.0)
Neutro Abs: 3.2 10*3/uL (ref 1.4–7.7)
Neutrophils Relative %: 48.4 % (ref 43.0–77.0)
Platelets: 364 10*3/uL (ref 150.0–400.0)
RBC: 5.11 Mil/uL (ref 3.87–5.11)
RDW: 15 % (ref 11.5–15.5)
WBC: 6.6 10*3/uL (ref 4.0–10.5)

## 2022-12-01 LAB — SEDIMENTATION RATE: Sed Rate: 27 mm/hr — ABNORMAL HIGH (ref 0–20)

## 2022-12-01 LAB — C-REACTIVE PROTEIN: CRP: <1 mg/dL (ref 0.5–20.0)

## 2022-12-01 NOTE — Patient Instructions (Addendum)
It was great to see you!  Keep me posted about your eye -- we can try to get you into your eye doctor urgently if it does not continue to improve or if you have new symptoms  I'll be in touch with blood work results   Take care,  Jarold Motto PA-C

## 2022-12-01 NOTE — Progress Notes (Addendum)
[name] is a 37 y.o. female here for a follow up of a pre-existing problem.  History of Present Illness:   Chief Complaint  Patient presents with   Blood work    Pt is wanting blood work for Autoimmune disorders.   Eye Problem    Pt has small blood clot in left eye happened 2 days ago, has some discomfort.    HPI  Blood Work: She is requesting blood work for autoimmune disorders.   She has family history of lupus and ongoing fatigue.  Subconjunctival hemorrhage She is experiencing headaches and eye redness She has had some headaches since this issue, she is taking tylenol as needed for the pain She reports sleeping with her contacts on She has been doing this for years without any complains until recently.  She denies any excessive tearing or vision changes.   HTN She reports that her blood pressure has been elevated and thus she restarted Norvasc 5 mg over a week ago Her high blood pressure has persisted for the past week and started prior to her eye complaints.  BP Readings from Last 3 Encounters:  12/01/22 126/88  11/10/22 (!) 140/90  09/18/22 (!) 143/98   Testosterone: She last She last took it in December of last year.  She continues having fatigue since she stopped.  She restarted this with her provider last week, already feeling improved fromthis   Past Medical History:  Diagnosis Date   ADHD (attention deficit hyperactivity disorder)    Asthma    Atypical chest pain    ED visit in epic 07-17-2022;  referred and evaluated by cardiologist-- dr m. branch , note in epic 08-26-2022   Complication of anesthesia    trouble waking up   History of gestational diabetes    History of pregnancy induced hypertension    HSV (herpes simplex virus) anogenital infection    Hypertension    Menorrhagia    Wears contact lenses      Social History   Tobacco Use   Smoking status: Never   Smokeless tobacco: Never  Vaping Use   Vaping status: Every Day    Substances: Nicotine, Flavoring   Devices: geek  Substance Use Topics   Alcohol use: Yes    Comment: socially   Drug use: Never    Past Surgical History:  Procedure Laterality Date   CESAREAN SECTION N/A 05/06/2018   Procedure: CESAREAN SECTION;  Surgeon: Gerald Leitz, MD;  Location: [facility];  Service: Obstetrics;  Laterality: N/A;   DIAGNOSTIC LAPAROSCOPY  05/03/2007   @[facility]  ;  for chronic pelvic pain   ENDOMETRIAL ABLATION N/A 09/18/2022   Procedure: ENDOMETRIAL ABLATION WITH NOVASURE;  Surgeon: Maxie Better, MD;  Location: [facility];  Service: Gynecology;  Laterality: N/A;   LUMBAR FUSION  2017   in [place];  L2--L5   TUBAL LIGATION N/A 09/18/2022   Procedure: LAPARASCOPIC BILATERAL TUBAL LIGATION;  Surgeon: Maxie Better, MD;  Location: [facility];  Service: Gynecology;  Laterality: N/A;    Family History  Problem Relation Age of Onset   Hypertension Mother    Asthma Mother    Thyroid disease Mother    Asthma Father    Hypertension Father    Heart disease Father    Hypertension Sister    Heart attack Maternal Grandmother    Heart attack Paternal Grandmother    Diabetes Paternal Grandmother    Stroke Paternal Grandmother    Kidney disease Paternal Grandmother  Thyroid disease Paternal Grandmother    Heart attack Paternal Grandfather    COPD Paternal Grandfather    Anesthesia problems Neg Hx    Hypotension Neg Hx    Malignant hyperthermia Neg Hx    Pseudochol deficiency Neg Hx     Allergies  Allergen Reactions   Latex Itching   Morphine And Codeine Itching    Lower doses OK   Doxycycline Rash   Penicillins Rash and Other (See Comments)    Childhood reaction ( unknown) Has patient had a PCN reaction causing immediate rash, facial/tongue/throat swelling, SOB or lightheadedness with hypotension: Yes Has patient had a PCN reaction causing severe rash involving mucus membranes or skin necrosis: No Has patient  had a PCN reaction that required hospitalization: No Has patient had a PCN reaction occurring within the last 10 years: No If all of the above answers are "NO", then may proceed with Cephalosporin use.     Current Medications:   Current Outpatient Medications:    acetaminophen (TYLENOL) 500 MG tablet, Take 500 mg by mouth every 6 (six) hours as needed., Disp: , Rfl:    albuterol (VENTOLIN HFA) 108 (90 Base) MCG/ACT inhaler, Inhale 1-2 puffs into the lungs every 6 (six) hours as needed for wheezing or shortness of breath., Disp: , Rfl:    amLODipine (NORVASC) 5 MG tablet, Take 5 mg by mouth daily., Disp: , Rfl:    Ferrous Sulfate (IRON) 325 (65 Fe) MG TABS, Take 1 tablet by mouth daily after lunch., Disp: , Rfl:    FLUoxetine (PROZAC) 20 MG capsule, Take 20 mg by mouth daily., Disp: , Rfl:    ketoconazole (NIZORAL) 2 % cream, Apply to affected area 1-2 times daily, Disp: 30 g, Rfl: 1   methylphenidate 54 MG PO CR tablet, Take 54 mg by mouth daily., Disp: , Rfl:    Testosterone 200 MG PLLT, Take by implantation route., Disp: , Rfl:    triamcinolone cream (KENALOG) 0.5 %, Apply 1 Application topically 2 (two) times daily as needed., Disp: , Rfl:    Review of Systems:   Review of Systems  Eyes:  Positive for pain and redness. Negative for blurred vision and discharge.  Musculoskeletal:  Positive for joint pain (wrist).    Vitals:   Vitals:   12/01/22 1111  BP: 126/88  Pulse: 76  Temp: 97.8 F (36.6 C)  TempSrc: Temporal  Weight: 214 lb 4 oz (97.2 kg)  Height: 5\' 9"  (1.753 m)     Body mass index is 31.64 kg/m.  Physical Exam:   Physical Exam Vitals and nursing note reviewed.  Constitutional:      General: She is not in acute distress.    Appearance: She is well-developed. She is not ill-appearing or toxic-appearing.  Eyes:     General: Lids are normal.        Right eye: No discharge.        Left eye: No discharge.     Extraocular Movements:     Right eye: Normal  extraocular motion.     Left eye: Normal extraocular motion.     Conjunctiva/sclera:     Right eye: Right conjunctiva is not injected.     Left eye: Left conjunctiva is not injected. Hemorrhage present.  Cardiovascular:     Rate and Rhythm: Normal rate and regular rhythm.     Pulses: Normal pulses.     Heart sounds: Normal heart sounds, S1 normal and S2 normal.  Pulmonary:  Effort: Pulmonary effort is normal.     Breath sounds: Normal breath sounds.  Skin:    General: Skin is warm and dry.  Neurological:     Mental Status: She is alert.     GCS: GCS eye subscore is 4. GCS verbal subscore is 5. GCS motor subscore is 6.  Psychiatric:        Speech: Speech normal.        Behavior: Behavior normal. Behavior is cooperative.     Assessment and Plan:   Fatigue, unspecified type; Family history of autoimmune disorder Update autoimmune testing per her request Discussed limitations of testing and low threshold to refer to rheum if blood work is slightest bit abnormal  Subconjunctival hemorrhage of left eye No red flags Improving from yesterday Handout provided If no improvement or any new symptom(s), needs to reach out to [place] or ophtho urgently Discussed avoiding continuous contact use  Essential hypertension Normotensive Continue amlodipine 5 mg daily Follow-up in 1 year, sooner if concerns or abnormal home readings  Hormone replacement therapy (HRT) Management per specialists  I,Shehryar Baig,acting as a scribe for Jarold Motto, PA.,have documented all relevant documentation on the behalf of Jarold Motto, PA,as directed by  Jarold Motto, PA while in the presence of Jarold Motto, [place].  I, Jarold Motto, [place], have reviewed all documentation for this visit. The documentation on 12/01/22 for the exam, diagnosis, procedures, and orders are all accurate and complete.  Jarold Motto, PA-C

## 2022-12-02 LAB — RHEUMATOID FACTOR: Rheumatoid fact SerPl-aCnc: <10 IU/mL (ref <14)

## 2022-12-03 LAB — ANA: Anti Nuclear Antibody (ANA): POSITIVE — AB

## 2022-12-03 LAB — ANTI-NUCLEAR AB-TITER (ANA TITER): ANA Titer 1: 1:80 {titer} — ABNORMAL HIGH

## 2022-12-03 LAB — CYCLIC CITRUL PEPTIDE ANTIBODY, IGG/IGA: Cyclic Citrullin Peptide Ab: 4 units (ref 0–19)

## 2022-12-04 ENCOUNTER — Encounter (HOSPITAL_BASED_OUTPATIENT_CLINIC_OR_DEPARTMENT_OTHER): Payer: Self-pay

## 2022-12-04 ENCOUNTER — Emergency Department (HOSPITAL_BASED_OUTPATIENT_CLINIC_OR_DEPARTMENT_OTHER)
Admission: EM | Admit: 2022-12-04 | Discharge: 2022-12-04 | Disposition: A | Discharge status: Home / Self Care | Payer: BLUE CROSS/BLUE SHIELD | Source: Home / Self Care | Attending: Emergency Medicine | Admitting: Emergency Medicine

## 2022-12-04 ENCOUNTER — Emergency Department (HOSPITAL_BASED_OUTPATIENT_CLINIC_OR_DEPARTMENT_OTHER): Payer: BLUE CROSS/BLUE SHIELD

## 2022-12-04 ENCOUNTER — Other Ambulatory Visit (HOSPITAL_BASED_OUTPATIENT_CLINIC_OR_DEPARTMENT_OTHER): Payer: Self-pay

## 2022-12-04 ENCOUNTER — Other Ambulatory Visit: Payer: Self-pay

## 2022-12-04 ENCOUNTER — Other Ambulatory Visit: Payer: Self-pay | Admitting: Physician Assistant

## 2022-12-04 DIAGNOSIS — Z91040 Latex allergy status: Secondary | ICD-10-CM | POA: Diagnosis not present

## 2022-12-04 DIAGNOSIS — Z79899 Other long term (current) drug therapy: Secondary | ICD-10-CM | POA: Diagnosis not present

## 2022-12-04 DIAGNOSIS — R5383 Other fatigue: Secondary | ICD-10-CM

## 2022-12-04 DIAGNOSIS — R768 Other specified abnormal immunological findings in serum: Secondary | ICD-10-CM

## 2022-12-04 DIAGNOSIS — Z20822 Contact with and (suspected) exposure to covid-19: Secondary | ICD-10-CM | POA: Insufficient documentation

## 2022-12-04 DIAGNOSIS — I10 Essential (primary) hypertension: Secondary | ICD-10-CM | POA: Insufficient documentation

## 2022-12-04 DIAGNOSIS — R7689 Other specified abnormal immunological findings in serum: Secondary | ICD-10-CM

## 2022-12-04 DIAGNOSIS — R519 Headache, unspecified: Secondary | ICD-10-CM | POA: Insufficient documentation

## 2022-12-04 DIAGNOSIS — J45909 Unspecified asthma, uncomplicated: Secondary | ICD-10-CM | POA: Diagnosis not present

## 2022-12-04 LAB — SARS CORONAVIRUS 2 BY RT PCR: SARS Coronavirus 2 by RT PCR: NEGATIVE

## 2022-12-04 LAB — PREGNANCY, URINE: Preg Test, Ur: NEGATIVE

## 2022-12-04 MED ORDER — LACTATED RINGERS IV BOLUS
1000.0000 mL | Freq: Once | INTRAVENOUS | Status: AC
  Administered 2022-12-04: 1000 mL via INTRAVENOUS

## 2022-12-04 MED ORDER — HALOPERIDOL LACTATE 5 MG/ML IJ SOLN
2.0000 mg | Freq: Once | INTRAMUSCULAR | Status: DC
  Filled 2022-12-04: qty 1

## 2022-12-04 MED ORDER — PROCHLORPERAZINE EDISYLATE 10 MG/2ML IJ SOLN
10.0000 mg | Freq: Once | INTRAMUSCULAR | Status: AC
  Administered 2022-12-04: 10 mg via INTRAVENOUS
  Filled 2022-12-04: qty 2

## 2022-12-04 MED ORDER — MAGNESIUM SULFATE 2 GM/50ML IV SOLN
2.0000 g | Freq: Once | INTRAVENOUS | Status: AC
  Administered 2022-12-04: 2 g via INTRAVENOUS
  Filled 2022-12-04: qty 50

## 2022-12-04 MED ORDER — SODIUM CHLORIDE 0.9 % IV SOLN: INTRAVENOUS | Status: DC | PRN

## 2022-12-04 MED ORDER — KETOROLAC TROMETHAMINE 30 MG/ML IJ SOLN
30.0000 mg | Freq: Once | INTRAMUSCULAR | Status: AC
  Administered 2022-12-04: 30 mg via INTRAVENOUS
  Filled 2022-12-04: qty 1

## 2022-12-04 MED ORDER — TETRACAINE HCL 0.5 % OP SOLN
2.0000 [drp] | Freq: Once | OPHTHALMIC | Status: AC
  Administered 2022-12-04: 2 [drp] via OPHTHALMIC
  Filled 2022-12-04: qty 4

## 2022-12-04 MED ORDER — DIPHENHYDRAMINE HCL 50 MG/ML IJ SOLN
25.0000 mg | Freq: Once | INTRAMUSCULAR | Status: AC
  Administered 2022-12-04: 25 mg via INTRAVENOUS
  Filled 2022-12-04: qty 1

## 2022-12-04 NOTE — ED Triage Notes (Signed)
In for eval of headache onset 5 days ago. Pain in left posterior head, behind left eye, and left neck. Denies N/V. Positive photosensitivity and sensitive to sound. Has blood in sclera of left eye onset Monday AM and headache onset Monday evening. OTC Ibuprofen 800 mg Q8 hrs with slight to no relief. Was seen by her PCP on Monday, testing for autoimmune disorders.
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
## 2022-12-04 NOTE — ED Notes (Signed)
 RN reviewed discharge instructions with pt. Pt verbalized understanding and had no further questions. VSS upon discharge.  

## 2022-12-14 ENCOUNTER — Encounter: Payer: Self-pay | Admitting: Physician Assistant

## 2022-12-14 MED ORDER — AMLODIPINE BESYLATE 5 MG PO TABS: 5.0000 mg | ORAL_TABLET | Freq: Every day | ORAL | 2 refills | Status: DC

## 2022-12-14 NOTE — Telephone Encounter (Signed)
Okay to send Rx for Amlodipine? Also pt asking about FMLA. I do not see where this was discussed. Does she need an appt?

## 2022-12-17 DIAGNOSIS — L659 Nonscarring hair loss, unspecified: Secondary | ICD-10-CM | POA: Diagnosis not present

## 2022-12-17 DIAGNOSIS — G5600 Carpal tunnel syndrome, unspecified upper limb: Secondary | ICD-10-CM | POA: Diagnosis not present

## 2022-12-17 DIAGNOSIS — M359 Systemic involvement of connective tissue, unspecified: Secondary | ICD-10-CM | POA: Diagnosis not present

## 2022-12-17 DIAGNOSIS — R768 Other specified abnormal immunological findings in serum: Secondary | ICD-10-CM | POA: Diagnosis not present

## 2022-12-17 DIAGNOSIS — M2550 Pain in unspecified joint: Secondary | ICD-10-CM | POA: Diagnosis not present

## 2022-12-22 ENCOUNTER — Institutional Professional Consult (permissible substitution): Payer: BLUE CROSS/BLUE SHIELD | Admitting: Primary Care

## 2022-12-22 NOTE — Progress Notes (Deleted)
@Patient  ID: [name], female    DOB: [birth year], 37 y.o.   MRN: [id number]  No chief complaint on file.   Referring provider: Maisie Fus, MD  HPI: 37 year old female, never smoked.  Past medical history significant for hypertension, fatigue,left eye subconjunctival hemorrhage, headaches, ADHD, depression, degenerative disc disease status post spinal fusion, tobacco abuse.   12/22/2022 Patient presents today for sleep consult.      Sleep questionnaire Symptoms-    Prior sleep study-  Bedtime- Time to fall asleep-  Nocturnal awakenings-  Out of bed/start of day-  Weight changes-  Do you operate heavy machinery-  Do you currently wear CPAP-  Do you current wear oxygen-  Epworth-     Allergies  Allergen Reactions   Latex Itching   Morphine And Codeine Itching    Lower doses OK   Doxycycline Rash   Penicillins Rash and Other (See Comments)    Childhood reaction ( unknown) Has patient had a PCN reaction causing immediate rash, facial/tongue/throat swelling, SOB or lightheadedness with hypotension: Yes Has patient had a PCN reaction causing severe rash involving mucus membranes or skin necrosis: No Has patient had a PCN reaction that required hospitalization: No Has patient had a PCN reaction occurring within the last 10 years: No If all of the above answers are "NO", then may proceed with Cephalosporin use.     Immunization History  Administered Date(s) Administered   Influenza,inj,Quad PF,6+ Mos 03/05/2018   Tdap 01/19/2018    Past Medical History:  Diagnosis Date   ADHD (attention deficit hyperactivity disorder)    Asthma    Atypical chest pain    ED visit in epic 07-17-2022;  referred and evaluated by cardiologist-- dr m. branch , note in epic 08-26-2022   Complication of anesthesia    trouble waking up   History of gestational diabetes    History of pregnancy induced hypertension    HSV (herpes simplex virus) anogenital infection     Hypertension    Menorrhagia    Wears contact lenses     Tobacco History: Social History   Tobacco Use  Smoking Status Never  Smokeless Tobacco Never   Counseling given: Not Answered   Outpatient Medications Prior to Visit  Medication Sig Dispense Refill   acetaminophen (TYLENOL) 500 MG tablet Take 500 mg by mouth every 6 (six) hours as needed.     albuterol (VENTOLIN HFA) 108 (90 Base) MCG/ACT inhaler Inhale 1-2 puffs into the lungs every 6 (six) hours as needed for wheezing or shortness of breath.     amLODipine (NORVASC) 5 MG tablet Take 1 tablet (5 mg total) by mouth daily. 30 tablet 2   Ferrous Sulfate (IRON) 325 (65 Fe) MG TABS Take 1 tablet by mouth daily after lunch.     FLUoxetine (PROZAC) 20 MG capsule Take 20 mg by mouth daily.     ketoconazole (NIZORAL) 2 % cream Apply to affected area 1-2 times daily 30 g 1   methylphenidate 54 MG PO CR tablet Take 54 mg by mouth daily.     Testosterone 200 MG PLLT Take by implantation route.     triamcinolone cream (KENALOG) 0.5 % Apply 1 Application topically 2 (two) times daily as needed.     No facility-administered medications prior to visit.      Review of Systems  Review of Systems   Physical Exam  There were no vitals taken for this visit. Physical Exam   Lab Results:  CBC  Component Value Date/Time   WBC 6.6 12/01/2022 1132   RBC 5.11 12/01/2022 1132   HGB 15.4 (H) 12/01/2022 1132   HCT 46.4 (H) 12/01/2022 1132   PLT 364.0 12/01/2022 1132   MCV 90.8 12/01/2022 1132   MCH 28.7 09/18/2022 0829   MCHC 33.1 12/01/2022 1132   RDW 15.0 12/01/2022 1132   LYMPHSABS 2.8 12/01/2022 1132   MONOABS 0.5 12/01/2022 1132   EOSABS 0.1 12/01/2022 1132   BASOSABS 0.0 12/01/2022 1132    BMET    Component Value Date/Time   NA 136 11/10/2022 1142   K 5.0 11/10/2022 1142   CL 98 11/10/2022 1142   CO2 29 11/10/2022 1142   GLUCOSE 85 11/10/2022 1142   BUN 10 11/10/2022 1142   CREATININE 1.03 11/10/2022 1142    CALCIUM 9.9 11/10/2022 1142   GFRNONAA >60 07/16/2022 2355   GFRAA >60 05/10/2018 2315    BNP No results found for: "BNP"  ProBNP No results found for: "PROBNP"  Imaging: CT Head Wo Contrast  Result Date: 12/04/2022 CLINICAL DATA:  37 year old female with persistent headache for 5 days. Left posterior head pain, pain behind the eye. Sound in light sensitive. EXAM: CT HEAD WITHOUT CONTRAST TECHNIQUE: Contiguous axial images were obtained from the base of the skull through the vertex without intravenous contrast. RADIATION DOSE REDUCTION: This exam was performed according to the departmental dose-optimization program which includes automated exposure control, adjustment of the mA and/or kV according to patient size and/or use of iterative reconstruction technique. COMPARISON:  None Available. FINDINGS: Brain: Normal cerebral volume. No midline shift, ventriculomegaly, mass effect, evidence of mass lesion, intracranial hemorrhage or evidence of cortically based acute infarction. Gray-white matter differentiation is within normal limits throughout the brain. Vascular: No suspicious intracranial vascular hyperdensity. Skull: Intact, negative. Sinuses/Orbits: Visualized paranasal sinuses and mastoids are clear. Other: Visualized orbits and scalp soft tissues are within normal limits. IMPRESSION: Normal noncontrast Head CT. Electronically Signed   By: Odessa Fleming M.D.   On: 12/04/2022 12:13     Assessment & Plan:   No problem-specific Assessment & Plan notes found for this encounter.     Glenford Bayley, NP 12/22/2022

## 2022-12-23 ENCOUNTER — Encounter: Payer: Self-pay | Admitting: Physician Assistant

## 2022-12-28 ENCOUNTER — Encounter: Payer: Self-pay | Admitting: Physician Assistant

## 2022-12-28 ENCOUNTER — Ambulatory Visit (INDEPENDENT_AMBULATORY_CARE_PROVIDER_SITE_OTHER): Payer: BLUE CROSS/BLUE SHIELD | Admitting: Physician Assistant

## 2022-12-28 VITALS — BP 140/100 | HR 88 | Temp 97.5°F | Ht 69.0 in | Wt 212.5 lb

## 2022-12-28 DIAGNOSIS — F32A Depression, unspecified: Secondary | ICD-10-CM

## 2022-12-28 DIAGNOSIS — R5383 Other fatigue: Secondary | ICD-10-CM | POA: Diagnosis not present

## 2022-12-28 DIAGNOSIS — I10 Essential (primary) hypertension: Secondary | ICD-10-CM

## 2022-12-28 DIAGNOSIS — F419 Anxiety disorder, unspecified: Secondary | ICD-10-CM

## 2022-12-28 MED ORDER — AMLODIPINE BESYLATE 10 MG PO TABS: 10.0000 mg | ORAL_TABLET | Freq: Every day | ORAL | 1 refills | Status: DC

## 2022-12-28 NOTE — Progress Notes (Signed)
[name] is a 37 y.o. female here for a follow up of a pre-existing problem.  History of Present Illness:   Chief Complaint  Patient presents with   f/u from Rheumatology    Pt here to discuss f/u from Rheumatology.    HPI  Fatigue: Reports she is still experiencing fatigue. States she missed appointment for sleep apnea study. Plans to reschedule.  Compliant with 325 mg Iron.  Mood/Stress: Expressed interest in temporary leave from work (no longer than 2 months) due to excessive stress from changes in management and home life.  Reports she is also meeting with psychiatrist and psychologist this week to discuss leave from work.  She is hoping to start leave of absence beginning of Sept.   Hypertension: Reports blood pressure is elevated despite compliance with 5 mg Amlodipine.  Skipped checking blood pressure for a couple of days, otherwise checks daily.  Denies chest pain, shortness of breath.   BP Readings from Last 3 Encounters:  12/28/22 (!) 140/100  12/04/22 (!) 140/87  12/01/22 126/88      Past Medical History:  Diagnosis Date   ADHD (attention deficit hyperactivity disorder)    Asthma    Atypical chest pain    ED visit in epic 07-17-2022;  referred and evaluated by cardiologist-- dr m. branch , note in epic 08-26-2022   Complication of anesthesia    trouble waking up   History of gestational diabetes    History of pregnancy induced hypertension    HSV (herpes simplex virus) anogenital infection    Hypertension    Menorrhagia    Wears contact lenses      Social History   Tobacco Use   Smoking status: Never   Smokeless tobacco: Never  Vaping Use   Vaping status: Every Day   Substances: Nicotine, Flavoring   Devices: geek  Substance Use Topics   Alcohol use: Yes    Comment: socially   Drug use: Never    Past Surgical History:  Procedure Laterality Date   CESAREAN SECTION N/A 05/06/2018   Procedure: CESAREAN SECTION;  Surgeon: Gerald Leitz,  MD;  Location: [facility];  Service: Obstetrics;  Laterality: N/A;   DIAGNOSTIC LAPAROSCOPY  05/03/2007   @[facility]  ;  for chronic pelvic pain   ENDOMETRIAL ABLATION N/A 09/18/2022   Procedure: ENDOMETRIAL ABLATION WITH NOVASURE;  Surgeon: Maxie Better, MD;  Location: [facility];  Service: Gynecology;  Laterality: N/A;   LUMBAR FUSION  2017   in [place];  L2--L5   TUBAL LIGATION N/A 09/18/2022   Procedure: LAPARASCOPIC BILATERAL TUBAL LIGATION;  Surgeon: Maxie Better, MD;  [facility] ;  Service: Gynecology;  Laterality: N/A;    Family History  Problem Relation Age of Onset   Hypertension Mother    Asthma Mother    Thyroid disease Mother    Asthma Father    Hypertension Father    Heart disease Father    Hypertension Sister    Heart attack Maternal Grandmother    Heart attack Paternal Grandmother    Diabetes Paternal Grandmother    Stroke Paternal Grandmother    Kidney disease Paternal Grandmother    Thyroid disease Paternal Grandmother    Heart attack Paternal Grandfather    COPD Paternal Grandfather    Anesthesia problems Neg Hx    Hypotension Neg Hx    Malignant hyperthermia Neg Hx    Pseudochol deficiency Neg Hx     Allergies  Allergen Reactions   Latex  Itching   Morphine And Codeine Itching    Lower doses OK   Doxycycline Rash   Penicillins Rash and Other (See Comments)    Childhood reaction ( unknown) Has patient had a PCN reaction causing immediate rash, facial/tongue/throat swelling, SOB or lightheadedness with hypotension: Yes Has patient had a PCN reaction causing severe rash involving mucus membranes or skin necrosis: No Has patient had a PCN reaction that required hospitalization: No Has patient had a PCN reaction occurring within the last 10 years: No If all of the above answers are "NO", then may proceed with Cephalosporin use.     Current Medications:   Current Outpatient Medications:     albuterol (VENTOLIN HFA) 108 (90 Base) MCG/ACT inhaler, Inhale 1-2 puffs into the lungs every 6 (six) hours as needed for wheezing or shortness of breath., Disp: , Rfl:    amLODipine (NORVASC) 10 MG tablet, Take 1 tablet (10 mg total) by mouth daily., Disp: 90 tablet, Rfl: 1   Ferrous Sulfate (IRON) 325 (65 Fe) MG TABS, Take 1 tablet by mouth daily after lunch., Disp: , Rfl:    FLUoxetine (PROZAC) 20 MG capsule, Take 20 mg by mouth daily., Disp: , Rfl:    methylphenidate 54 MG PO CR tablet, Take 54 mg by mouth daily., Disp: , Rfl:    Testosterone 200 MG PLLT, Take by implantation route., Disp: , Rfl:    triamcinolone cream (KENALOG) 0.5 %, Apply 1 Application topically 2 (two) times daily as needed., Disp: , Rfl:    Review of Systems:   Review of Systems  Constitutional:  Positive for malaise/fatigue (persisting).  Psychiatric/Behavioral:         (+) Excessive stress (see HPI)    Vitals:   Vitals:   12/28/22 1500 12/28/22 1522  BP: (!) 140/100 (!) 140/100  Pulse: 88   Temp: (!) 97.5 F (36.4 C)   TempSrc: Temporal   SpO2: 98%   Weight: 212 lb 8 oz (96.4 kg)   Height: 5\' 9"  (1.753 m)      Body mass index is 31.38 kg/m.  Physical Exam:   Physical Exam Constitutional:      Appearance: Normal appearance. She is well-developed.  HENT:     Head: Normocephalic and atraumatic.  Eyes:     General: Lids are normal.     Extraocular Movements: Extraocular movements intact.     Conjunctiva/sclera: Conjunctivae normal.  Pulmonary:     Effort: Pulmonary effort is normal.  Musculoskeletal:        General: Normal range of motion.     Cervical back: Normal range of motion and neck supple.  Skin:    General: Skin is warm and dry.  Neurological:     Mental Status: She is alert and oriented to person, place, and time.  Psychiatric:        Attention and Perception: Attention and perception normal.        Mood and Affect: Mood normal.        Behavior: Behavior normal.        Thought  Content: Thought content normal.        Judgment: Judgment normal.     Assessment and Plan:   Fatigue, unspecified type Ongoing Recommend OSA evaluation and encouraged her to reschedule this Follow-up after this for ongoing discussion Continue to work on better mental health support  Essential hypertension Above goal today No evidence of end-organ damage on my exam Recommend patient monitor home blood pressure at least a few  times weekly Increase amlodipine to 10 mg daily If home monitoring shows consistent elevation, or any symptom(s) develop, recommend reach out to [place] for further advice on next steps  Anxiety and depression Ongoing Management per psychiatry and talk therapy Follow-up with [place] to provide support as able Denies SI/HI  I,Emily Lagle,acting as a scribe for [employer], PA.,have documented all relevant documentation on the behalf of Jarold Motto, PA,as directed by  Jarold Motto, PA while in the presence of Jarold Motto, [place].  I, Jarold Motto, [place], have reviewed all documentation for this visit. The documentation on 12/28/22 for the exam, diagnosis, procedures, and orders are all accurate and complete.  Jarold Motto, PA-C

## 2022-12-28 NOTE — Patient Instructions (Signed)
It was great to see you!  Increase amlodipine to 10 mg   Please message me with your blood pressures after a few days  Get back in with sleep medicine -- we have to rule out sleep apnea as a cause of your symptom(s)  Follow-up with psychiatry and let me know what they say about leave of absence  Take care,  Jarold Motto PA-C

## 2022-12-29 ENCOUNTER — Institutional Professional Consult (permissible substitution): Payer: BLUE CROSS/BLUE SHIELD | Admitting: Primary Care

## 2022-12-29 DIAGNOSIS — F909 Attention-deficit hyperactivity disorder, unspecified type: Secondary | ICD-10-CM | POA: Diagnosis not present

## 2022-12-29 DIAGNOSIS — F419 Anxiety disorder, unspecified: Secondary | ICD-10-CM | POA: Diagnosis not present

## 2022-12-29 DIAGNOSIS — F329 Major depressive disorder, single episode, unspecified: Secondary | ICD-10-CM | POA: Diagnosis not present

## 2023-01-06 ENCOUNTER — Encounter: Payer: Self-pay | Admitting: Nurse Practitioner

## 2023-01-06 ENCOUNTER — Ambulatory Visit (INDEPENDENT_AMBULATORY_CARE_PROVIDER_SITE_OTHER): Payer: BLUE CROSS/BLUE SHIELD | Admitting: Nurse Practitioner

## 2023-01-06 VITALS — BP 120/80 | HR 96 | Ht 69.0 in | Wt 212.8 lb

## 2023-01-06 DIAGNOSIS — G4700 Insomnia, unspecified: Secondary | ICD-10-CM | POA: Diagnosis not present

## 2023-01-06 DIAGNOSIS — E669 Obesity, unspecified: Secondary | ICD-10-CM | POA: Diagnosis not present

## 2023-01-06 DIAGNOSIS — R0683 Snoring: Secondary | ICD-10-CM | POA: Diagnosis not present

## 2023-01-06 DIAGNOSIS — G4719 Other hypersomnia: Secondary | ICD-10-CM | POA: Diagnosis not present

## 2023-01-06 NOTE — Patient Instructions (Signed)
Given your symptoms, I am concerned that you may have sleep disordered breathing with sleep apnea. You will need a sleep study for further evaluation. Someone will contact you to schedule this.   We discussed how untreated sleep apnea puts an individual at risk for cardiac arrhthymias, pulm HTN, DM, stroke and increases their risk for daytime accidents. We also briefly reviewed treatment options including weight loss, side sleeping position, oral appliance, CPAP therapy or referral to ENT for possible surgical options  Use caution when driving and pull over if you become sleepy.  Follow up in 6 weeks with Katie Cobb,NP to go over sleep study results, or sooner, if needed   

## 2023-01-06 NOTE — Progress Notes (Signed)
@Patient  ID: [name], female    DOB: [birth year], 37 y.o.   MRN: [id number]  Chief Complaint  Patient presents with   Consult    Pt is here for Sleep Consult. Pt states she Snores. EPW- 10    Referring provider: Jarold Motto, PA  HPI: 37 year old female, never smoker referred for sleep consult. Past medical history significant for HTN, DDD, ADHD, HLD, depression.   TEST/EVENTS:   01/06/2023: Today - sleep consult Patient presents today for sleep consult, referred by Samantha Motto, PA. She has had trouble with her sleep and daytime fatigue for many years. Her sleep quality and snoring worsened when she became pregnant with her son, who is now 51 years old. Her weight was up to 250 lb after having him. She has been working on weight loss since. She has not been told that she stops breathing when she sleeps but she does feel like she chokes sometimes. She wakes up often. Feels tired in the mornings. Could doze off during the day easily, if allowed. She does get tired when she's driving longer distances and has to stop. Has never fallen asleep while driving. Denies morning headaches, sleep parasomnias/paralysis.  She goes to bed between 10-11 pm. Falls asleep within 30 minutes. Gets up around 2 am and 4 am. Wakes up for the day around 8:30-9 am. Does not operate any heavy machinery in her job Animal nutritionist. Weight is down 20 lb over the last two years. She did have a previous sleep study before she had her son but her symptoms have gotten worse since then. She does not remember the results or where this was.  She has a history of HTN. No history of DM or stroke. She is a never smoker. Rarely drinks alcohol. No excessive caffeine intake. No sleep aids. Lives with her boyfriend and son. Works as a Production designer, theatre/television/film.   Allergies  Allergen Reactions   Latex Itching   Morphine And Codeine Itching    Lower doses OK   Doxycycline Rash   Penicillins Rash and Other (See Comments)    Childhood reaction (  unknown) Has patient had a PCN reaction causing immediate rash, facial/tongue/throat swelling, SOB or lightheadedness with hypotension: Yes Has patient had a PCN reaction causing severe rash involving mucus membranes or skin necrosis: No Has patient had a PCN reaction that required hospitalization: No Has patient had a PCN reaction occurring within the last 10 years: No If all of the above answers are "NO", then may proceed with Cephalosporin use.     Immunization History  Administered Date(s) Administered   Influenza,inj,Quad PF,6+ Mos 03/05/2018   Tdap 01/19/2018    Past Medical History:  Diagnosis Date   ADHD (attention deficit hyperactivity disorder)    Asthma    Atypical chest pain    ED visit in epic 07-17-2022;  referred and evaluated by cardiologist-- dr m. branch , note in epic 08-26-2022   Complication of anesthesia    trouble waking up   History of gestational diabetes    History of pregnancy induced hypertension    HSV (herpes simplex virus) anogenital infection    Hypertension    Menorrhagia    Wears contact lenses     Tobacco History: Social History   Tobacco Use  Smoking Status Never  Smokeless Tobacco Never   Counseling given: Not Answered   Outpatient Medications Prior to Visit  Medication Sig Dispense Refill   albuterol (VENTOLIN HFA) 108 (90 Base) MCG/ACT inhaler Inhale  1-2 puffs into the lungs every 6 (six) hours as needed for wheezing or shortness of breath.     amLODipine (NORVASC) 10 MG tablet Take 1 tablet (10 mg total) by mouth daily. 90 tablet 1   Ferrous Sulfate (IRON) 325 (65 Fe) MG TABS Take 1 tablet by mouth daily after lunch.     FLUoxetine (PROZAC) 20 MG capsule Take 20 mg by mouth daily.     methylphenidate 54 MG PO CR tablet Take 54 mg by mouth daily.     Testosterone 200 MG PLLT Take by implantation route.     triamcinolone cream (KENALOG) 0.5 % Apply 1 Application topically 2 (two) times daily as needed.     No  facility-administered medications prior to visit.     Review of Systems:   Constitutional: No night sweats, fevers, chills, or lassitude. +fatigue, intentional weight loss HEENT: No headaches, difficulty swallowing, tooth/dental problems, or sore throat. No sneezing, itching, ear ache, nasal congestion, or post nasal drip CV:  No chest pain, orthopnea, PND, swelling in lower extremities, anasarca, dizziness, palpitations, syncope Resp: +snoring. No shortness of breath with exertion or at rest. No excess mucus or change in color of mucus. No productive or non-productive. No hemoptysis. No wheezing.  No chest wall deformity GI:  No heartburn, indigestion  GU: No dysuria, change in color of urine, urgency or frequency.   Skin: No rash, lesions, ulcerations MSK:  No joint pain or swelling.   Neuro: No dizziness or lightheadedness.  Psych: No depression or anxiety. Mood stable. +sleep disturbance    Physical Exam:  BP 120/80 (BP Location: Left Arm, Cuff Size: Large)   Pulse 96   Ht 5\' 9"  (1.753 m)   Wt 212 lb 12.8 oz (96.5 kg)   SpO2 100%   BMI 31.43 kg/m   GEN: Pleasant, interactive, well-appearing; in no acute distress HEENT:  Normocephalic and atraumatic. PERRLA. Sclera white. Nasal turbinates pink, moist and patent bilaterally. No rhinorrhea present. Oropharynx pink and moist, without exudate or edema. No lesions, ulcerations, or postnasal drip.  NECK:  Supple w/ fair ROM. No JVD present. Normal carotid impulses w/o bruits. Thyroid symmetrical with no goiter or nodules palpated. No lymphadenopathy.   CV: RRR, no m/r/g, no peripheral edema. Pulses intact, +2 bilaterally. No cyanosis, pallor or clubbing. PULMONARY:  Unlabored, regular breathing. Clear bilaterally A&P w/o wheezes/rales/rhonchi. No accessory muscle use.  GI: BS present and normoactive. Soft, non-tender to palpation. No organomegaly or masses detected.  MSK: No erythema, warmth or tenderness. Cap refil <2 sec all extrem.  No deformities or joint swelling noted.  Neuro: A/Ox3. No focal deficits noted.   Skin: Warm, no lesions or rashe Psych: Normal affect and behavior. Judgement and thought content appropriate.     Lab Results:  CBC    Component Value Date/Time   WBC 6.6 12/01/2022 1132   RBC 5.11 12/01/2022 1132   HGB 15.4 (H) 12/01/2022 1132   HCT 46.4 (H) 12/01/2022 1132   PLT 364.0 12/01/2022 1132   MCV 90.8 12/01/2022 1132   MCH 28.7 09/18/2022 0829   MCHC 33.1 12/01/2022 1132   RDW 15.0 12/01/2022 1132   LYMPHSABS 2.8 12/01/2022 1132   MONOABS 0.5 12/01/2022 1132   EOSABS 0.1 12/01/2022 1132   BASOSABS 0.0 12/01/2022 1132    BMET    Component Value Date/Time   NA 136 11/10/2022 1142   K 5.0 11/10/2022 1142   CL 98 11/10/2022 1142   CO2 29 11/10/2022 1142  GLUCOSE 85 11/10/2022 1142   BUN 10 11/10/2022 1142   CREATININE 1.03 11/10/2022 1142   CALCIUM 9.9 11/10/2022 1142   GFRNONAA >60 07/16/2022 2355   GFRAA >60 05/10/2018 2315    BNP No results found for: "BNP"   Imaging:  No results found.  Administration History     None           No data to display          No results found for: "NITRICOXIDE"      Assessment & Plan:   Excessive daytime sleepiness She has snoring, excessive daytime sleepiness, restless sleep. BMI 31. History of HTN. Epworth . Given this,  I am concerned she could have sleep disordered breathing with obstructive sleep apnea. She will need sleep study for further evaluation.    - discussed how weight can impact sleep and risk for sleep disordered breathing - discussed options to assist with weight loss: combination of diet modification, cardiovascular and strength training exercises   - had an extensive discussion regarding the adverse health consequences related to untreated sleep disordered breathing - specifically discussed the risks for hypertension, coronary artery disease, cardiac dysrhythmias, cerebrovascular disease, and  diabetes - lifestyle modification discussed   - discussed how sleep disruption can increase risk of accidents, particularly when driving - safe driving practices were discussed   Insomnia See above  Obesity (BMI 30.0-34.9) BMI 31. Healthy weight loss encouraged.    I spent 35 minutes of dedicated to the care of this patient on the date of this encounter to include pre-visit review of records, face-to-face time with the patient discussing conditions above, post visit ordering of testing, clinical documentation with the electronic health record, making appropriate referrals as documented, and communicating necessary findings to members of the patients care team.  Noemi Chapel, NP 01/07/2023  Pt aware and understands NP's role.

## 2023-01-07 ENCOUNTER — Encounter: Payer: Self-pay | Admitting: Physician Assistant

## 2023-01-07 ENCOUNTER — Encounter: Payer: Self-pay | Admitting: Nurse Practitioner

## 2023-01-07 DIAGNOSIS — G4719 Other hypersomnia: Secondary | ICD-10-CM | POA: Insufficient documentation

## 2023-01-07 DIAGNOSIS — G4700 Insomnia, unspecified: Secondary | ICD-10-CM | POA: Insufficient documentation

## 2023-01-07 DIAGNOSIS — E669 Obesity, unspecified: Secondary | ICD-10-CM | POA: Insufficient documentation

## 2023-01-07 NOTE — Assessment & Plan Note (Signed)
She has snoring, excessive daytime sleepiness, restless sleep. BMI 31. History of HTN. Epworth . Given this,  I am concerned she could have sleep disordered breathing with obstructive sleep apnea. She will need sleep study for further evaluation.    - discussed how weight can impact sleep and risk for sleep disordered breathing - discussed options to assist with weight loss: combination of diet modification, cardiovascular and strength training exercises   - had an extensive discussion regarding the adverse health consequences related to untreated sleep disordered breathing - specifically discussed the risks for hypertension, coronary artery disease, cardiac dysrhythmias, cerebrovascular disease, and diabetes - lifestyle modification discussed   - discussed how sleep disruption can increase risk of accidents, particularly when driving - safe driving practices were discussed

## 2023-01-07 NOTE — Assessment & Plan Note (Signed)
BMI 31. Healthy weight loss encouraged.  

## 2023-01-07 NOTE — Assessment & Plan Note (Signed)
See above

## 2023-01-08 ENCOUNTER — Encounter: Payer: Self-pay | Admitting: Physician Assistant

## 2023-01-13 DIAGNOSIS — F419 Anxiety disorder, unspecified: Secondary | ICD-10-CM | POA: Diagnosis not present

## 2023-01-13 DIAGNOSIS — F909 Attention-deficit hyperactivity disorder, unspecified type: Secondary | ICD-10-CM | POA: Diagnosis not present

## 2023-01-13 DIAGNOSIS — F329 Major depressive disorder, single episode, unspecified: Secondary | ICD-10-CM | POA: Diagnosis not present

## 2023-01-13 DIAGNOSIS — F411 Generalized anxiety disorder: Secondary | ICD-10-CM | POA: Diagnosis not present

## 2023-02-01 DIAGNOSIS — F411 Generalized anxiety disorder: Secondary | ICD-10-CM | POA: Diagnosis not present

## 2023-02-03 DIAGNOSIS — F909 Attention-deficit hyperactivity disorder, unspecified type: Secondary | ICD-10-CM | POA: Diagnosis not present

## 2023-02-03 DIAGNOSIS — F419 Anxiety disorder, unspecified: Secondary | ICD-10-CM | POA: Diagnosis not present

## 2023-02-03 DIAGNOSIS — F329 Major depressive disorder, single episode, unspecified: Secondary | ICD-10-CM | POA: Diagnosis not present

## 2023-02-09 NOTE — Telephone Encounter (Signed)
SNAP states it is pending signature

## 2023-02-12 DIAGNOSIS — G4710 Hypersomnia, unspecified: Secondary | ICD-10-CM

## 2023-02-12 DIAGNOSIS — G4719 Other hypersomnia: Secondary | ICD-10-CM

## 2023-02-12 DIAGNOSIS — R0683 Snoring: Secondary | ICD-10-CM

## 2023-02-17 DIAGNOSIS — F411 Generalized anxiety disorder: Secondary | ICD-10-CM | POA: Diagnosis not present

## 2023-02-18 DIAGNOSIS — Z1159 Encounter for screening for other viral diseases: Secondary | ICD-10-CM | POA: Diagnosis not present

## 2023-02-18 DIAGNOSIS — Z113 Encounter for screening for infections with a predominantly sexual mode of transmission: Secondary | ICD-10-CM | POA: Diagnosis not present

## 2023-02-18 DIAGNOSIS — Z114 Encounter for screening for human immunodeficiency virus [HIV]: Secondary | ICD-10-CM | POA: Diagnosis not present

## 2023-03-03 ENCOUNTER — Telehealth (INDEPENDENT_AMBULATORY_CARE_PROVIDER_SITE_OTHER): Payer: BLUE CROSS/BLUE SHIELD | Admitting: Nurse Practitioner

## 2023-03-03 ENCOUNTER — Encounter: Payer: Self-pay | Admitting: Nurse Practitioner

## 2023-03-03 DIAGNOSIS — G4719 Other hypersomnia: Secondary | ICD-10-CM

## 2023-03-03 DIAGNOSIS — E66811 Obesity, class 1: Secondary | ICD-10-CM | POA: Diagnosis not present

## 2023-03-03 NOTE — Progress Notes (Signed)
Patient ID: BRISEIS HEY, female     DOB: 1985/11/01, 37 y.o.      MRN: [id number]  Chief Complaint  Patient presents with   Follow-up    HST 01/19/23    Virtual Visit via Video Note  I connected with [name] on 03/03/23 at  3:30 PM EDT by a video enabled telemedicine application and verified that I am speaking with the correct person using two identifiers.  Location: Patient: Home Provider: Office   I discussed the limitations of evaluation and management by telemedicine and the availability of in person appointments. The patient expressed understanding and agreed to proceed.  History of Present Illness: 37 year old female, never smoker referred for sleep consult. Past medical history significant for HTN, DDD, ADHD, HLD, depression.    TEST/EVENTS:   01/19/2023 HST: AHI 4.3/h, spO2 low 89%  01/06/2023: OV with Sirinity Outland NP for sleep consult, referred by Jarold Motto, PA. She has had trouble with her sleep and daytime fatigue for many years. Her sleep quality and snoring worsened when she became pregnant with her son, who is now 11 years old. Her weight was up to 250 lb after having him. She has been working on weight loss since. She has not been told that she stops breathing when she sleeps but she does feel like she chokes sometimes. She wakes up often. Feels tired in the mornings. Could doze off during the day easily, if allowed. She does get tired when she's driving longer distances and has to stop. Has never fallen asleep while driving. Denies morning headaches, sleep parasomnias/paralysis.  She goes to bed between 10-11 pm. Falls asleep within 30 minutes. Gets up around 2 am and 4 am. Wakes up for the day around 8:30-9 am. Does not operate any heavy machinery in her job Animal nutritionist. Weight is down 20 lb over the last two years. She did have a previous sleep study before she had her son but her symptoms have gotten worse since then. She does not remember the results or where this  was.  She has a history of HTN. No history of DM or stroke. She is a never smoker. Rarely drinks alcohol. No excessive caffeine intake. No sleep aids. Lives with her boyfriend and son. Works as a Production designer, theatre/television/film.   03/03/2023: Today - follow up Patient presents today for follow up to review sleep study results via virtual visit. Her 4% AHI was 4.3/h, which is not diagnostic of sleep disordered breathing. She doesn't think the test was that adequate. She had trouble wearing it the few nights she had to. She continues to wake up often during the night. She snores loudly. She feels tired all the time, which has been an ongoing issue for her for as long as she can remember. She naps during the day but still feels tired. She just took a nap and woke up 20 minutes ago. She is on Ritalin for her ADHD but this still doesn't seem to help. She does get tired with longer distances while driving. Never fallen asleep while driving. No sleep parasomnias/paralysis or morning headaches. Would like to undergo further workup.   Allergies  Allergen Reactions   Latex Itching   Morphine And Codeine Itching    Lower doses OK   Doxycycline Rash   Penicillins Rash and Other (See Comments)    Childhood reaction ( unknown) Has patient had a PCN reaction causing immediate rash, facial/tongue/throat swelling, SOB or lightheadedness with hypotension: Yes Has patient had  a PCN reaction causing severe rash involving mucus membranes or skin necrosis: No Has patient had a PCN reaction that required hospitalization: No Has patient had a PCN reaction occurring within the last 10 years: No If all of the above answers are "NO", then may proceed with Cephalosporin use.    Immunization History  Administered Date(s) Administered   Influenza,inj,Quad PF,6+ Mos 03/05/2018   Tdap 01/19/2018   Past Medical History:  Diagnosis Date   ADHD (attention deficit hyperactivity disorder)    Asthma    Atypical chest pain    ED visit in epic  07-17-2022;  referred and evaluated by cardiologist-- dr m. branch , note in epic 08-26-2022   Complication of anesthesia    trouble waking up   History of gestational diabetes    History of pregnancy induced hypertension    HSV (herpes simplex virus) anogenital infection    Hypertension    Menorrhagia    Wears contact lenses     Tobacco History: Social History   Tobacco Use  Smoking Status Never  Smokeless Tobacco Never   Counseling given: Not Answered   Outpatient Medications Prior to Visit  Medication Sig Dispense Refill   albuterol (VENTOLIN HFA) 108 (90 Base) MCG/ACT inhaler Inhale 1-2 puffs into the lungs every 6 (six) hours as needed for wheezing or shortness of breath.     amLODipine (NORVASC) 10 MG tablet Take 1 tablet (10 mg total) by mouth daily. 90 tablet 1   Ferrous Sulfate (IRON) 325 (65 Fe) MG TABS Take 1 tablet by mouth daily after lunch.     FLUoxetine (PROZAC) 40 MG capsule Take 40 mg by mouth daily.     Testosterone 200 MG PLLT Take by implantation route.     triamcinolone cream (KENALOG) 0.5 % Apply 1 Application topically 2 (two) times daily as needed.     QELBREE 200 MG 24 hr capsule Take by mouth. (Patient not taking: Reported on 03/03/2023)     FLUoxetine (PROZAC) 20 MG capsule Take 20 mg by mouth daily.     methylphenidate 54 MG PO CR tablet Take 54 mg by mouth daily. (Patient not taking: Reported on 03/03/2023)     No facility-administered medications prior to visit.     Review of Systems:   Constitutional: No night sweats, fevers, chills, or lassitude. +fatigue, intentional weight loss HEENT: No headaches, difficulty swallowing, tooth/dental problems, or sore throat. No sneezing, itching, ear ache, nasal congestion, or post nasal drip CV:  No chest pain, orthopnea, PND, swelling in lower extremities, anasarca, dizziness, palpitations, syncope Resp: +snoring. No shortness of breath with exertion or at rest. No excess mucus or change in color of  mucus. No productive or non-productive. No hemoptysis. No wheezing.  No chest wall deformity GI:  No heartburn, indigestion  GU: No dysuria, change in color of urine, urgency or frequency.   Skin: No rash, lesions, ulcerations MSK:  No joint pain or swelling.   Neuro: No dizziness or lightheadedness.  Psych: No depression or anxiety. Mood stable. +sleep disturbance  Observations/Objective: Patient is well-developed, well-nourished in no acute distress. A&Ox3. Resting comfortably at home. Unlabored breathing. Speech is clear and coherent with logical content.   Assessment and Plan: Excessive daytime sleepiness She has ongoing symptoms of snoring, excessive daytime sleepiness, restless sleep. She does not feel home sleep study was adequate. She understands an in lab study requires monitoring cables/leads as well. With her persistent symptoms, I am concerned she does have sleep disordered breathing with obstructive  sleep apnea. We will order an in lab study to truly rule out sleep apnea. If she does not have any evidence of sleep disordered breathing, I recommend she complete MSLT following NPSG to rule out narcolepsy given daytime hypersomia.    - discussed how weight can impact sleep and risk for sleep disordered breathing - discussed options to assist with weight loss: combination of diet modification, cardiovascular and strength training exercises   - had an extensive discussion regarding the adverse health consequences related to untreated sleep disordered breathing - specifically discussed the risks for hypertension, coronary artery disease, cardiac dysrhythmias, cerebrovascular disease, and diabetes - lifestyle modification discussed   - discussed how sleep disruption can increase risk of accidents, particularly when driving - safe driving practices were discussed  Patient Instructions  Your home sleep study did not reveal any significant sleep disordered breathing by insurance  requirements. Given your persistent symptoms and difficulty with testing, I will repeat an in lab sleep study. If this is negative, you will have a multi sleep latency study to rule out narcolepsy. Someone will contact you to schedule these.  We discussed how untreated sleep apnea puts an individual at risk for cardiac arrhthymias, pulm HTN, DM, stroke and increases their risk for daytime accidents. We also briefly reviewed treatment options including weight loss, side sleeping position, oral appliance, CPAP therapy or referral to ENT for possible surgical options  Use caution when driving and pull over if you become sleepy.  Follow up in 8 weeks with Katie Shrey Boike,NP to go over sleep study results, or sooner, if needed     Obesity (BMI 30.0-34.9) Healthy weight loss encouraged     I discussed the assessment and treatment plan with the patient. The patient was provided an opportunity to ask questions and all were answered. The patient agreed with the plan and demonstrated an understanding of the instructions.   The patient was advised to call back or seek an in-person evaluation if the symptoms worsen or if the condition fails to improve as anticipated.  I provided 35 minutes of non-face-to-face time during this encounter.   Noemi Chapel, NP

## 2023-03-03 NOTE — Patient Instructions (Signed)
Your home sleep study did not reveal any significant sleep disordered breathing by insurance requirements. Given your persistent symptoms and difficulty with testing, I will repeat an in lab sleep study. If this is negative, you will have a multi sleep latency study to rule out narcolepsy. Someone will contact you to schedule these.  We discussed how untreated sleep apnea puts an individual at risk for cardiac arrhthymias, pulm HTN, DM, stroke and increases their risk for daytime accidents. We also briefly reviewed treatment options including weight loss, side sleeping position, oral appliance, CPAP therapy or referral to ENT for possible surgical options  Use caution when driving and pull over if you become sleepy.  Follow up in 8 weeks with Samantha Kanaan Kagawa,NP to go over sleep study results, or sooner, if needed

## 2023-03-03 NOTE — Assessment & Plan Note (Signed)
She has ongoing symptoms of snoring, excessive daytime sleepiness, restless sleep. She does not feel home sleep study was adequate. She understands an in lab study requires monitoring cables/leads as well. With her persistent symptoms, I am concerned she does have sleep disordered breathing with obstructive sleep apnea. We will order an in lab study to truly rule out sleep apnea. If she does not have any evidence of sleep disordered breathing, I recommend she complete MSLT following NPSG to rule out narcolepsy given daytime hypersomia.    - discussed how weight can impact sleep and risk for sleep disordered breathing - discussed options to assist with weight loss: combination of diet modification, cardiovascular and strength training exercises   - had an extensive discussion regarding the adverse health consequences related to untreated sleep disordered breathing - specifically discussed the risks for hypertension, coronary artery disease, cardiac dysrhythmias, cerebrovascular disease, and diabetes - lifestyle modification discussed   - discussed how sleep disruption can increase risk of accidents, particularly when driving - safe driving practices were discussed  Patient Instructions  Your home sleep study did not reveal any significant sleep disordered breathing by insurance requirements. Given your persistent symptoms and difficulty with testing, I will repeat an in lab sleep study. If this is negative, you will have a multi sleep latency study to rule out narcolepsy. Someone will contact you to schedule these.  We discussed how untreated sleep apnea puts an individual at risk for cardiac arrhthymias, pulm HTN, DM, stroke and increases their risk for daytime accidents. We also briefly reviewed treatment options including weight loss, side sleeping position, oral appliance, CPAP therapy or referral to ENT for possible surgical options  Use caution when driving and pull over if you become  sleepy.  Follow up in 8 weeks with Katie Cha Gomillion,NP to go over sleep study results, or sooner, if needed

## 2023-03-03 NOTE — Assessment & Plan Note (Signed)
Healthy weight loss encouraged 

## 2023-03-05 DIAGNOSIS — F329 Major depressive disorder, single episode, unspecified: Secondary | ICD-10-CM | POA: Diagnosis not present

## 2023-03-05 DIAGNOSIS — F909 Attention-deficit hyperactivity disorder, unspecified type: Secondary | ICD-10-CM | POA: Diagnosis not present

## 2023-03-05 DIAGNOSIS — F419 Anxiety disorder, unspecified: Secondary | ICD-10-CM | POA: Diagnosis not present

## 2023-03-08 DIAGNOSIS — F909 Attention-deficit hyperactivity disorder, unspecified type: Secondary | ICD-10-CM | POA: Diagnosis not present

## 2023-03-08 DIAGNOSIS — F411 Generalized anxiety disorder: Secondary | ICD-10-CM | POA: Diagnosis not present

## 2023-03-08 DIAGNOSIS — F329 Major depressive disorder, single episode, unspecified: Secondary | ICD-10-CM | POA: Diagnosis not present

## 2023-03-08 DIAGNOSIS — F419 Anxiety disorder, unspecified: Secondary | ICD-10-CM | POA: Diagnosis not present

## 2023-03-18 DIAGNOSIS — F329 Major depressive disorder, single episode, unspecified: Secondary | ICD-10-CM | POA: Diagnosis not present

## 2023-03-18 DIAGNOSIS — F419 Anxiety disorder, unspecified: Secondary | ICD-10-CM | POA: Diagnosis not present

## 2023-03-18 DIAGNOSIS — F909 Attention-deficit hyperactivity disorder, unspecified type: Secondary | ICD-10-CM | POA: Diagnosis not present

## 2023-03-23 ENCOUNTER — Telehealth: Payer: Self-pay | Admitting: Nurse Practitioner

## 2023-03-23 DIAGNOSIS — F329 Major depressive disorder, single episode, unspecified: Secondary | ICD-10-CM | POA: Diagnosis not present

## 2023-03-23 DIAGNOSIS — F909 Attention-deficit hyperactivity disorder, unspecified type: Secondary | ICD-10-CM | POA: Diagnosis not present

## 2023-03-23 DIAGNOSIS — F419 Anxiety disorder, unspecified: Secondary | ICD-10-CM | POA: Diagnosis not present

## 2023-03-23 NOTE — Telephone Encounter (Signed)
Patient would like to schedule sleep study. Patient phone number is [phone].

## 2023-03-24 NOTE — Telephone Encounter (Signed)
Case is Pending through [facility] CASE ID: [id number]  With clinicals faxed   Faxed to [phone]

## 2023-03-24 NOTE — Telephone Encounter (Signed)
PT now requesting Sleep Study on [facility]. Please call to advise. Thanks.

## 2023-03-24 NOTE — Telephone Encounter (Signed)
Working on authorization now.

## 2023-03-24 NOTE — Telephone Encounter (Signed)
Please let the patient know once you receive authorization.

## 2023-03-25 NOTE — Telephone Encounter (Signed)
Shakimah obtained the auth & Chantel C scheduled the patient for 12/15 & notified the patient.  I called the patient & we discussed this at length.  Pt asked to be placed on the wait list if there are any cancellations.  I called the sleep lab & had the patient added to the wait list.  I notified the patient via my-chart message.  Nothing further needed at this time.

## 2023-03-25 NOTE — Telephone Encounter (Signed)
[name], please advise. Thanks

## 2023-03-26 DIAGNOSIS — E860 Dehydration: Secondary | ICD-10-CM | POA: Diagnosis not present

## 2023-03-26 DIAGNOSIS — R109 Unspecified abdominal pain: Secondary | ICD-10-CM | POA: Diagnosis not present

## 2023-03-26 DIAGNOSIS — M546 Pain in thoracic spine: Secondary | ICD-10-CM | POA: Diagnosis not present

## 2023-03-26 DIAGNOSIS — R3129 Other microscopic hematuria: Secondary | ICD-10-CM | POA: Diagnosis not present

## 2023-03-30 DIAGNOSIS — F411 Generalized anxiety disorder: Secondary | ICD-10-CM | POA: Diagnosis not present

## 2023-04-05 DIAGNOSIS — R3121 Asymptomatic microscopic hematuria: Secondary | ICD-10-CM | POA: Diagnosis not present

## 2023-04-05 DIAGNOSIS — R1032 Left lower quadrant pain: Secondary | ICD-10-CM | POA: Diagnosis not present

## 2023-04-06 ENCOUNTER — Other Ambulatory Visit: Payer: Self-pay | Admitting: Physician Assistant

## 2023-04-12 DIAGNOSIS — F419 Anxiety disorder, unspecified: Secondary | ICD-10-CM | POA: Diagnosis not present

## 2023-04-12 DIAGNOSIS — F909 Attention-deficit hyperactivity disorder, unspecified type: Secondary | ICD-10-CM | POA: Diagnosis not present

## 2023-04-12 DIAGNOSIS — F329 Major depressive disorder, single episode, unspecified: Secondary | ICD-10-CM | POA: Diagnosis not present

## 2023-04-12 DIAGNOSIS — G4733 Obstructive sleep apnea (adult) (pediatric): Secondary | ICD-10-CM | POA: Diagnosis not present

## 2023-04-13 DIAGNOSIS — G4733 Obstructive sleep apnea (adult) (pediatric): Secondary | ICD-10-CM | POA: Diagnosis not present

## 2023-04-15 DIAGNOSIS — G4733 Obstructive sleep apnea (adult) (pediatric): Secondary | ICD-10-CM | POA: Diagnosis not present

## 2023-04-21 DIAGNOSIS — F909 Attention-deficit hyperactivity disorder, unspecified type: Secondary | ICD-10-CM | POA: Diagnosis not present

## 2023-04-21 DIAGNOSIS — F419 Anxiety disorder, unspecified: Secondary | ICD-10-CM | POA: Diagnosis not present

## 2023-04-21 DIAGNOSIS — F329 Major depressive disorder, single episode, unspecified: Secondary | ICD-10-CM | POA: Diagnosis not present

## 2023-04-25 ENCOUNTER — Encounter (HOSPITAL_BASED_OUTPATIENT_CLINIC_OR_DEPARTMENT_OTHER): Payer: BLUE CROSS/BLUE SHIELD | Admitting: Internal Medicine

## 2023-04-26 ENCOUNTER — Encounter (HOSPITAL_BASED_OUTPATIENT_CLINIC_OR_DEPARTMENT_OTHER): Payer: BLUE CROSS/BLUE SHIELD | Admitting: Internal Medicine

## 2023-04-26 DIAGNOSIS — G4733 Obstructive sleep apnea (adult) (pediatric): Secondary | ICD-10-CM | POA: Diagnosis not present

## 2023-04-30 ENCOUNTER — Telehealth: Payer: BLUE CROSS/BLUE SHIELD | Admitting: Nurse Practitioner

## 2023-05-13 DIAGNOSIS — F329 Major depressive disorder, single episode, unspecified: Secondary | ICD-10-CM | POA: Diagnosis not present

## 2023-05-13 DIAGNOSIS — F419 Anxiety disorder, unspecified: Secondary | ICD-10-CM | POA: Diagnosis not present

## 2023-05-13 DIAGNOSIS — F909 Attention-deficit hyperactivity disorder, unspecified type: Secondary | ICD-10-CM | POA: Diagnosis not present

## 2023-05-20 DIAGNOSIS — N898 Other specified noninflammatory disorders of vagina: Secondary | ICD-10-CM | POA: Diagnosis not present

## 2023-05-20 DIAGNOSIS — Z113 Encounter for screening for infections with a predominantly sexual mode of transmission: Secondary | ICD-10-CM | POA: Diagnosis not present

## 2023-05-20 DIAGNOSIS — Z114 Encounter for screening for human immunodeficiency virus [HIV]: Secondary | ICD-10-CM | POA: Diagnosis not present

## 2023-05-20 DIAGNOSIS — Z1159 Encounter for screening for other viral diseases: Secondary | ICD-10-CM | POA: Diagnosis not present

## 2023-05-25 DIAGNOSIS — E039 Hypothyroidism, unspecified: Secondary | ICD-10-CM | POA: Diagnosis not present

## 2023-05-25 DIAGNOSIS — N951 Menopausal and female climacteric states: Secondary | ICD-10-CM | POA: Diagnosis not present

## 2023-05-27 DIAGNOSIS — R6882 Decreased libido: Secondary | ICD-10-CM | POA: Diagnosis not present

## 2023-05-27 DIAGNOSIS — Z79890 Hormone replacement therapy (postmenopausal): Secondary | ICD-10-CM | POA: Diagnosis not present

## 2023-05-27 DIAGNOSIS — N951 Menopausal and female climacteric states: Secondary | ICD-10-CM | POA: Diagnosis not present

## 2023-05-27 DIAGNOSIS — R232 Flushing: Secondary | ICD-10-CM | POA: Diagnosis not present

## 2023-05-31 DIAGNOSIS — G4733 Obstructive sleep apnea (adult) (pediatric): Secondary | ICD-10-CM | POA: Diagnosis not present

## 2023-06-09 DIAGNOSIS — F419 Anxiety disorder, unspecified: Secondary | ICD-10-CM | POA: Diagnosis not present

## 2023-06-09 DIAGNOSIS — F909 Attention-deficit hyperactivity disorder, unspecified type: Secondary | ICD-10-CM | POA: Diagnosis not present

## 2023-06-09 DIAGNOSIS — F329 Major depressive disorder, single episode, unspecified: Secondary | ICD-10-CM | POA: Diagnosis not present

## 2023-07-07 DIAGNOSIS — F329 Major depressive disorder, single episode, unspecified: Secondary | ICD-10-CM | POA: Diagnosis not present

## 2023-07-07 DIAGNOSIS — F909 Attention-deficit hyperactivity disorder, unspecified type: Secondary | ICD-10-CM | POA: Diagnosis not present

## 2023-07-07 DIAGNOSIS — F419 Anxiety disorder, unspecified: Secondary | ICD-10-CM | POA: Diagnosis not present

## 2023-07-11 ENCOUNTER — Other Ambulatory Visit: Payer: Self-pay

## 2023-07-11 ENCOUNTER — Emergency Department (HOSPITAL_BASED_OUTPATIENT_CLINIC_OR_DEPARTMENT_OTHER)

## 2023-07-11 ENCOUNTER — Encounter (HOSPITAL_BASED_OUTPATIENT_CLINIC_OR_DEPARTMENT_OTHER): Payer: Self-pay

## 2023-07-11 ENCOUNTER — Emergency Department (HOSPITAL_BASED_OUTPATIENT_CLINIC_OR_DEPARTMENT_OTHER)
Admission: EM | Admit: 2023-07-11 | Discharge: 2023-07-11 | Disposition: A | Discharge status: Home / Self Care | Attending: Emergency Medicine | Admitting: Emergency Medicine

## 2023-07-11 DIAGNOSIS — K8020 Calculus of gallbladder without cholecystitis without obstruction: Secondary | ICD-10-CM | POA: Diagnosis not present

## 2023-07-11 DIAGNOSIS — R079 Chest pain, unspecified: Secondary | ICD-10-CM | POA: Diagnosis not present

## 2023-07-11 DIAGNOSIS — M546 Pain in thoracic spine: Secondary | ICD-10-CM | POA: Diagnosis not present

## 2023-07-11 DIAGNOSIS — M40204 Unspecified kyphosis, thoracic region: Secondary | ICD-10-CM | POA: Diagnosis not present

## 2023-07-11 DIAGNOSIS — Z91040 Latex allergy status: Secondary | ICD-10-CM | POA: Insufficient documentation

## 2023-07-11 DIAGNOSIS — M549 Dorsalgia, unspecified: Secondary | ICD-10-CM

## 2023-07-11 DIAGNOSIS — J9811 Atelectasis: Secondary | ICD-10-CM | POA: Diagnosis not present

## 2023-07-11 DIAGNOSIS — R0789 Other chest pain: Secondary | ICD-10-CM | POA: Diagnosis not present

## 2023-07-11 LAB — CBC WITH DIFFERENTIAL/PLATELET
Abs Immature Granulocytes: 0.02 10*3/uL (ref 0.00–0.07)
Basophils Absolute: 0.1 10*3/uL (ref 0.0–0.1)
Basophils Relative: 1 %
Eosinophils Absolute: 0.1 10*3/uL (ref 0.0–0.5)
Eosinophils Relative: 2 %
HCT: 43.7 % (ref 36.0–46.0)
Hemoglobin: 15 g/dL (ref 12.0–15.0)
Immature Granulocytes: 0 %
Lymphocytes Relative: 51 %
Lymphs Abs: 3.6 10*3/uL (ref 0.7–4.0)
MCH: 31.4 pg (ref 26.0–34.0)
MCHC: 34.3 g/dL (ref 30.0–36.0)
MCV: 91.6 fL (ref 80.0–100.0)
Monocytes Absolute: 0.4 10*3/uL (ref 0.1–1.0)
Monocytes Relative: 6 %
Neutro Abs: 2.9 10*3/uL (ref 1.7–7.7)
Neutrophils Relative %: 40 %
Platelets: 362 10*3/uL (ref 150–400)
RBC: 4.77 MIL/uL (ref 3.87–5.11)
RDW: 13.6 % (ref 11.5–15.5)
WBC: 7.1 10*3/uL (ref 4.0–10.5)
nRBC: 0 % (ref 0.0–0.2)

## 2023-07-11 LAB — BASIC METABOLIC PANEL
Anion gap: 8 (ref 5–15)
BUN: 6 mg/dL (ref 6–20)
CO2: 30 mmol/L (ref 22–32)
Calcium: 9.3 mg/dL (ref 8.9–10.3)
Chloride: 101 mmol/L (ref 98–111)
Creatinine, Ser: 1.05 mg/dL — ABNORMAL HIGH (ref 0.44–1.00)
GFR, Estimated: >60 mL/min (ref >60)
Glucose, Bld: 128 mg/dL — ABNORMAL HIGH (ref 70–99)
Potassium: 3.9 mmol/L (ref 3.5–5.1)
Sodium: 139 mmol/L (ref 135–145)

## 2023-07-11 LAB — D-DIMER, QUANTITATIVE: D-Dimer, Quant: <0.27 ug{FEU}/mL (ref 0.00–0.50)

## 2023-07-11 LAB — TROPONIN I (HIGH SENSITIVITY)
Troponin I (High Sensitivity): 15 ng/L (ref <18)
Troponin I (High Sensitivity): 13 ng/L (ref <18)

## 2023-07-11 LAB — HCG, SERUM, QUALITATIVE: Preg, Serum: NEGATIVE

## 2023-07-11 MED ORDER — METHOCARBAMOL 500 MG PO TABS: 500.0000 mg | ORAL_TABLET | Freq: Three times a day (TID) | ORAL | 0 refills | Status: DC | PRN

## 2023-07-11 MED ORDER — LIDOCAINE 5 % EX PTCH: 1.0000 | MEDICATED_PATCH | CUTANEOUS | 0 refills | Status: DC

## 2023-07-11 MED ORDER — OXYCODONE-ACETAMINOPHEN 5-325 MG PO TABS
1.0000 | ORAL_TABLET | ORAL | Status: DC | PRN
  Administered 2023-07-11: 1 via ORAL
  Filled 2023-07-11: qty 1

## 2023-07-11 MED ORDER — METHYLPREDNISOLONE 4 MG PO TBPK: ORAL_TABLET | ORAL | 0 refills | Status: DC

## 2023-07-11 MED ORDER — ONDANSETRON 4 MG PO TBDP
4.0000 mg | ORAL_TABLET | Freq: Once | ORAL | Status: AC
  Administered 2023-07-11: 4 mg via ORAL
  Filled 2023-07-11: qty 1

## 2023-07-11 MED ORDER — HYDROMORPHONE HCL 1 MG/ML IJ SOLN
1.0000 mg | Freq: Once | INTRAMUSCULAR | Status: DC
  Filled 2023-07-11: qty 1

## 2023-07-11 MED ORDER — METHOCARBAMOL 500 MG PO TABS: 500.0000 mg | ORAL_TABLET | Freq: Two times a day (BID) | ORAL | 0 refills | Status: DC

## 2023-07-11 MED ORDER — KETOROLAC TROMETHAMINE 30 MG/ML IJ SOLN
15.0000 mg | Freq: Once | INTRAMUSCULAR | Status: AC
  Administered 2023-07-11: 15 mg via INTRAVENOUS
  Filled 2023-07-11: qty 1

## 2023-07-11 MED ORDER — IOHEXOL 350 MG/ML SOLN
100.0000 mL | Freq: Once | INTRAVENOUS | Status: AC | PRN
  Administered 2023-07-11: 100 mL via INTRAVENOUS

## 2023-07-11 MED ORDER — DIAZEPAM 2 MG PO TABS
2.0000 mg | ORAL_TABLET | Freq: Once | ORAL | Status: AC
  Administered 2023-07-11: 2 mg via ORAL
  Filled 2023-07-11: qty 1

## 2023-07-11 MED ORDER — NAPROXEN 500 MG PO TABS: 500.0000 mg | ORAL_TABLET | Freq: Two times a day (BID) | ORAL | 0 refills | Status: DC | PRN

## 2023-07-11 MED ORDER — METHOCARBAMOL 500 MG PO TABS
500.0000 mg | ORAL_TABLET | Freq: Once | ORAL | Status: AC
  Administered 2023-07-11: 500 mg via ORAL
  Filled 2023-07-11: qty 1

## 2023-07-11 NOTE — ED Provider Notes (Signed)
 [facility] AT [facility] Provider Note   CSN: [id number] Arrival date & time: 07/11/23  0131     History  Chief Complaint  Patient presents with   Back Pain    [name] is a 38 y.o. female.  Patient presents with 2 days of mid back pain.  Contrary to triage note it has not been 3 to 4 days.  She denies any fall or injury.  Pain started while she was driving.  Denies any lifting, twisting, possible injury.  No focal weakness, numbness or tingling.  No bowel or bladder incontinence.  No fever, chest pain, cough, shortness of breath.  She has never had this kind of pain before.  Does have a history of low back pain and multiple previous low back surgeries but this kind of pain is different.  She is never had this kind of pain before.  Did take Tylenol at home and tried using Epsom salts in a warm bath which helped some.  Denies any history of IV drug abuse or cancer.  Has never had this pain in this location previously.  She feels like the pain is "along my bra strap".  The history is provided by the patient.  Back Pain Associated symptoms: no abdominal pain, no chest pain, no dysuria, no fever, no headaches and no weakness        Home Medications Prior to Admission medications   Medication Sig Start Date End Date Taking? Authorizing Provider  albuterol (VENTOLIN HFA) 108 (90 Base) MCG/ACT inhaler Inhale 1-2 puffs into the lungs every 6 (six) hours as needed for wheezing or shortness of breath.    [provider]  amLODipine (NORVASC) 10 MG tablet Take 1 tablet (10 mg total) by mouth daily. 12/28/22   Jarold Motto, PA  Ferrous Sulfate (IRON) 325 (65 Fe) MG TABS Take 1 tablet by mouth daily after lunch.    [provider]  FLUoxetine (PROZAC) 40 MG capsule Take 40 mg by mouth daily. 01/12/23   [provider]  QELBREE 200 MG 24 hr capsule Take by mouth. Patient not taking: Reported on 03/03/2023 02/19/23   [provider]  Testosterone 200 MG PLLT Take by implantation route.    [provider]  triamcinolone cream (KENALOG) 0.5 % Apply 1 Application topically 2 (two) times daily as needed. 07/13/22   [provider]      Allergies    Latex, Morphine and codeine, Doxycycline, and Penicillins    Review of Systems   Review of Systems  Constitutional:  Negative for activity change, appetite change and fever.  HENT:  Negative for congestion.   Respiratory:  Negative for cough, chest tightness and shortness of breath.   Cardiovascular:  Negative for chest pain.  Gastrointestinal:  Negative for abdominal pain, nausea and vomiting.  Genitourinary:  Negative for dysuria and hematuria.  Musculoskeletal:  Positive for back pain.  Skin:  Negative for rash.  Neurological:  Negative for dizziness, weakness and headaches.   all other systems are negative except as noted in the HPI and PMH.    Physical Exam Updated Vital Signs BP (!) 145/103   Pulse 97   Temp 97.6 F (36.4 C)   Resp 18   SpO2 98%  Physical Exam Vitals and nursing note reviewed.  Constitutional:      General: She is not in acute distress.    Appearance: She is well-developed.  HENT:     Head: Normocephalic and atraumatic.  Mouth/Throat:     Pharynx: No oropharyngeal exudate.  Eyes:     Conjunctiva/sclera: Conjunctivae normal.     Pupils: Pupils are equal, round, and reactive to light.  Neck:     Comments: No meningismus. Cardiovascular:     Rate and Rhythm: Normal rate and regular rhythm.     Heart sounds: Normal heart sounds. No murmur heard. Pulmonary:     Effort: Pulmonary effort is normal. No respiratory distress.     Breath sounds: Normal breath sounds.  Abdominal:     Palpations: Abdomen is soft.     Tenderness: There is no abdominal tenderness. There is no guarding or rebound.  Musculoskeletal:        General: Tenderness present. Normal range of motion.     Cervical back: Normal range of  motion and neck supple.     Comments: Bilateral mid thoracic back tenderness, no rash, there is some midline tenderness.  5/5 strength in bilateral lower extremities. Ankle plantar and dorsiflexion intact. Great toe extension intact bilaterally. +2 DP and PT pulses. +2 patellar reflexes bilaterally. Normal gait.   Skin:    General: Skin is warm.  Neurological:     Mental Status: She is alert and oriented to person, place, and time.     Cranial Nerves: No cranial nerve deficit.     Motor: No abnormal muscle tone.     Coordination: Coordination normal.     Comments:  5/5 strength throughout. CN 2-12 intact.Equal grip strength.   Psychiatric:        Behavior: Behavior normal.     ED Results / Procedures / Treatments   Labs (all labs ordered are listed, but only abnormal results are displayed) Labs Reviewed  BASIC METABOLIC PANEL - Abnormal; Notable for the following components:      Result Value   Glucose, Bld 128 (*)    Creatinine, Ser 1.05 (*)    All other components within normal limits  CBC WITH DIFFERENTIAL/PLATELET  D-DIMER, QUANTITATIVE  HCG, SERUM, QUALITATIVE  TROPONIN I (HIGH SENSITIVITY)  TROPONIN I (HIGH SENSITIVITY)    EKG EKG Interpretation Date/Time:  Sunday July 11 2023 05:47:18 EST Ventricular Rate:  79 PR Interval:  145 QRS Duration:  75 QT Interval:  350 QTC Calculation: 402 R Axis:   62  Text Interpretation: Sinus rhythm Posterior infarct, old Borderline T abnormalities, inferior leads Nonspecific T wave abnormality Confirmed by Glynn Octave [phone]) on 07/11/2023 6:03:59 AM  Radiology No results found.  Procedures Procedures    Medications Ordered in ED Medications  oxyCODONE-acetaminophen (PERCOCET/ROXICET) 5-325 MG per tablet 1 tablet (1 tablet Oral Given 07/11/23 0149)  HYDROmorphone (DILAUDID) injection 1 mg (has no administration in time range)  methocarbamol (ROBAXIN) tablet 500 mg (has no administration in time range)  diazepam  (VALIUM) tablet 2 mg (has no administration in time range)  ondansetron (ZOFRAN-ODT) disintegrating tablet 4 mg (4 mg Oral Given 07/11/23 0150)    ED Course/ Medical Decision Making/ A&P                                 Medical Decision Making Amount and/or Complexity of Data Reviewed Labs: ordered. Decision-making details documented in ED Course. Radiology: ordered and independent interpretation performed. Decision-making details documented in ED Course. ECG/medicine tests: ordered and independent interpretation performed. Decision-making details documented in ED Course.  Risk Prescription drug management.   Here with mid back pain since yesterday.  Neurologically  intact.  No focal deficits.  Low concern for cord compression or cauda equina.  Given location of pain will obtain EKG, labs, treat pain symptoms.  Low suspicion for compression or cauda equina.  She is neurologically intact.  EKG without acute ischemia.  Labs are reassuring with negative troponin negative D-dimer.  Low suspicion for ACS or pulmonary embolism.  Given her atypical location of back pain between her shoulder blades will proceed with CT imaging of her aorta and thoracic spine.  If this is negative we will treat as musculoskeletal back pain with anti-inflammatories and pain control. Care to be transferred at shift change.        Final Clinical Impression(s) / ED Diagnoses Final diagnoses:  None    Rx / DC Orders ED Discharge Orders     None         Jory Tanguma, Jeannett Senior, MD 07/11/23 [phone]

## 2023-07-11 NOTE — Discharge Instructions (Addendum)
 Testing is reassuring.  No evidence of serious spinal cord problem.  No evidence of heart attack or blood clot in the lung.  Take the muscle relaxers and anti-inflammatories as prescribed.  Follow-up with your doctor.  Return to the ED with worsening pain, weakness, numbness, tingling, bowel or bladder incontinence or other concerns.  I have prescribed you Medrol Dosepak and Robaxin as a muscle relaxant and anti-inflammatory medicine.  You can take 1000 mg of Tylenol every 6 hours as needed for pain.  Consider buying lidocaine patches over-the-counter as well to help I have also sent in a prescription for this but sometimes it is cheaper to buy over-the-counter as well.  This is just an option but not necessary to buy lidocaine patches if it is too expensive.

## 2023-07-11 NOTE — ED Provider Notes (Signed)
 Patient signed out to me at 7 AM waiting dissection study, CT back, repeat troponin.  Lab work thus far unremarkable.  Suspicion is for MSK type pain however.  CT scans unremarkable per radiology report.  She has gallstones but she is not symptomatic in that area.  Overall repeat troponin is unremarkable.  Will treat with Robaxin, Medrol Dosepak, lidocaine patches, Tylenol and rest.  Discharged in good condition.  Understands return precautions.  Follow-up with primary care.  This chart was dictated using voice recognition software.  Despite best efforts to proofread,  errors can occur which can change the documentation meaning.    Virgina Norfolk, DO 07/11/23 [phone]

## 2023-07-11 NOTE — ED Triage Notes (Signed)
 Pt is coming in with mid upper back pain that started yesterday, she tried doing tylenol which did help some but still was very painful, She also mentions trying and epsom salt bath but that made it worse. She has a Hx of x3 back surgeries. She has no chest pain, no shortness of breath. Cannot attribute any activities that would've caused this pain or injury.

## 2023-07-11 NOTE — ED Notes (Signed)
 Dc instructions reviewed with patient. Patient voiced understanding. Dc with belongings.

## 2023-07-12 ENCOUNTER — Encounter: Payer: Self-pay | Admitting: Physician Assistant

## 2023-07-12 ENCOUNTER — Ambulatory Visit (INDEPENDENT_AMBULATORY_CARE_PROVIDER_SITE_OTHER): Admitting: Physician Assistant

## 2023-07-12 VITALS — BP 146/90 | HR 100 | Temp 98.8°F | Ht 69.0 in | Wt 224.4 lb

## 2023-07-12 DIAGNOSIS — I10 Essential (primary) hypertension: Secondary | ICD-10-CM | POA: Diagnosis not present

## 2023-07-12 DIAGNOSIS — E66811 Obesity, class 1: Secondary | ICD-10-CM

## 2023-07-12 DIAGNOSIS — M546 Pain in thoracic spine: Secondary | ICD-10-CM

## 2023-07-12 DIAGNOSIS — G4733 Obstructive sleep apnea (adult) (pediatric): Secondary | ICD-10-CM

## 2023-07-12 MED ORDER — ZEPBOUND 2.5 MG/0.5ML ~~LOC~~ SOAJ: 2.5000 mg | SUBCUTANEOUS | 0 refills | Status: DC

## 2023-07-12 NOTE — Patient Instructions (Addendum)
 It was great to see you!  A referral has been placed for you to see one of our fantastic providers at [facility]. Someone from their office will be in touch soon regarding scheduling your appointment.  Their location:  [facility]  [address] on the 1st floor Phone number [phone] Fax [phone].   This location is across the street from the entrance to [employer] and in the same complex as the [facility]  I have sent in Zepbound -- this may take time to get authorized -- message me if you haven't heard anything in 1-2 weeks.  Your blood pressure is elevated in our office today.  I recommend that you monitor this at home.  Your goal blood pressure should be around < 130/80, unless you are over 27 years old, your goal may be closer to 140-150/90. Please note if you have been given other goals from a cardiologist or other healthcare provider, please defer to their recommendations.  When preparing to take your blood pressure: Plan ahead. Don't smoke, drink caffeine or exercise within 30 minutes before taking your blood pressure. Empty your bladder. Don't take the measurement over clothes. Remove the clothing over the arm that will be used to measure blood pressure. You can use either arm unless otherwise told by a healthcare provider. Usually there is not a big difference between readings on them. Be still. Allow at least five minutes of quiet rest before measurements. Don't talk or use the phone. Sit correctly. Sit with your back straight and supported (on a dining chair, rather than a sofa). Your feet should be flat on the floor. Do not cross your legs. Support your arm on a flat surface. The middle of the cuff should be placed on the upper arm at heart level.  Measure at the same time of the day. Take multiple readings and record the results. Each time you measure, take two readings one minute apart. Record the  results and bring in to your next office visit.  In order to know how well the medication is working, I would like you to take your readings 1-2 hours after taking your blood pressure medication if possible. Take your blood pressure measurements and record 2-3 days per week.  If you get a high blood pressure reading: A single high reading is not an immediate cause for alarm. If you get a reading that is higher than normal, take your blood pressure a second time. Write down the results of both measurements. Check with your health care professional to see if there's a health concern or whether there may be problems with your monitor. If your blood pressure readings are suddenly higher than 180/120 mm Hg, wait at least one minute and test again. If your readings are still very high, contact your health care professional immediately. You could be having a hypertensive crisis. Call 911 if your blood pressure is higher than 180/120 mm Hg and if you are having new signs or symptoms that may include: Chest pain Shortness of breath Back pain Numbness Weakness Change in vision Difficulty speaking Confusion Dizziness Vomiting       Take care,  Jarold Motto PA-C

## 2023-07-12 NOTE — Progress Notes (Signed)
 [name] is a 38 y.o. female here for a follow up of a pre-existing problem.  History of Present Illness:   Chief Complaint  Patient presents with   Back Pain    Pt upper back pain between her bra straps, started on Saturday and was seen in the ED yesterday 3/2.     HPI  Back Pain  Patient was seen in the ED on 07-11-23 for persistent back pain that is located along her bra strap/mid upper back. CT scans, lab work, and troponin were all unremarkable for any concerns. A gallstone was indicated but she was asymptomatic --- has knows about these stones for quite some time. Patient was treated with Robaxin, Medrol Dosepak, lidocaine patches, Tylenol and rest.   Today, she continues to complains of persistent pain. She was not prescribed any pain management medication other than a lidocaine patch. She states that she was walking her dog prior to the onset of her symptoms. Pain started when she was driving to [place]. At that time, she took 1000 mg Tylenol without much relief.   She states that she tried to relief her pain by soaking in an epsom salt bath which significantly worsened her symptoms and led her to ED. She is currently complaint with her steroid course but it has not changed her symptom(s).   Does report having a history of back pain and surgeries but states that this current pain is different from her previous. Back surgery was a result of waking up one day without being able to walk. This occurred when she was 27. Surgeries were done in [place].  Weight loss Patient is interested in losing weight. She is expressing interest in medical interventions at this time.  She states that her sister is currently using GLP1 and seeing great results.   She is eating well  She was exercising prior to her back injury  Sleep Apnea  She states that she's currently following up with [facility]. She is currently using a mouth guard to help relieve her symptoms.  Condition  continues to be managed by them.   HTN Currently taking amlodipine 10 mg. At home blood pressure readings are: not checked. Patient denies chest pain, SOB, blurred vision, dizziness, unusual headaches, lower leg swelling. Patient is compliant with medication. Denies excessive caffeine intake, stimulant usage, excessive alcohol intake, or increase in salt consumption.  BP Readings from Last 3 Encounters:  07/12/23 (!) 146/90  07/11/23 (!) 139/92  01/06/23 120/80     Past Medical History:  Diagnosis Date   ADHD (attention deficit hyperactivity disorder)    Asthma    Atypical chest pain    ED visit in epic 07-17-2022;  referred and evaluated by cardiologist-- dr m. branch , note in epic 08-26-2022   Complication of anesthesia    trouble waking up   History of gestational diabetes    History of pregnancy induced hypertension    HSV (herpes simplex virus) anogenital infection    Hypertension    Menorrhagia    Wears contact lenses      Social History   Tobacco Use   Smoking status: Never   Smokeless tobacco: Never  Vaping Use   Vaping status: Every Day   Substances: Nicotine, Flavoring   Devices: geek  Substance Use Topics   Alcohol use: Yes    Comment: socially   Drug use: Never    Past Surgical History:  Procedure Laterality Date   CESAREAN SECTION N/A 05/06/2018  Procedure: CESAREAN SECTION;  Surgeon: Gerald Leitz, MD;  Location: [facility];  Service: Obstetrics;  Laterality: N/A;   DIAGNOSTIC LAPAROSCOPY  05/03/2007   @[facility]  ;  for chronic pelvic pain   ENDOMETRIAL ABLATION N/A 09/18/2022   Procedure: ENDOMETRIAL ABLATION WITH NOVASURE;  Surgeon: Maxie Better, MD;  Location: [facility] [place];  Service: Gynecology;  Laterality: N/A;   LUMBAR FUSION  2017   in [place];  L2--L5   TUBAL LIGATION N/A 09/18/2022   Procedure: LAPARASCOPIC BILATERAL TUBAL LIGATION;  Surgeon: Maxie Better, MD;  Location: [facility];  Service:  Gynecology;  Laterality: N/A;    Family History  Problem Relation Age of Onset   Hypertension Mother    Asthma Mother    Thyroid disease Mother    Asthma Father    Hypertension Father    Heart disease Father    Hypertension Sister    Heart attack Maternal Grandmother    Heart attack Paternal Grandmother    Diabetes Paternal Grandmother    Stroke Paternal Grandmother    Kidney disease Paternal Grandmother    Thyroid disease Paternal Grandmother    Heart attack Paternal Grandfather    COPD Paternal Grandfather    Anesthesia problems Neg Hx    Hypotension Neg Hx    Malignant hyperthermia Neg Hx    Pseudochol deficiency Neg Hx     Allergies  Allergen Reactions   Latex Itching   Morphine And Codeine Itching    Lower doses OK   Doxycycline Rash   Penicillins Rash and Other (See Comments)    Childhood reaction ( unknown) Has patient had a PCN reaction causing immediate rash, facial/tongue/throat swelling, SOB or lightheadedness with hypotension: Yes Has patient had a PCN reaction causing severe rash involving mucus membranes or skin necrosis: No Has patient had a PCN reaction that required hospitalization: No Has patient had a PCN reaction occurring within the last 10 years: No If all of the above answers are "NO", then may proceed with Cephalosporin use.     Current Medications:   Current Outpatient Medications:    albuterol (VENTOLIN HFA) 108 (90 Base) MCG/ACT inhaler, Inhale 1-2 puffs into the lungs every 6 (six) hours as needed for wheezing or shortness of breath., Disp: , Rfl:    amLODipine (NORVASC) 10 MG tablet, Take 1 tablet (10 mg total) by mouth daily., Disp: 90 tablet, Rfl: 1   busPIRone (BUSPAR) 5 MG tablet, Take 5 mg by mouth 3 (three) times daily., Disp: , Rfl:    Cholecalciferol 50 MCG (2000 UT) CAPS, Take 1 capsule by mouth daily., Disp: , Rfl:    Ferrous Sulfate (IRON) 325 (65 Fe) MG TABS, Take 1 tablet by mouth daily after lunch., Disp: , Rfl:     methocarbamol (ROBAXIN) 500 MG tablet, Take 1 tablet (500 mg total) by mouth 2 (two) times daily., Disp: 20 tablet, Rfl: 0   methylPREDNISolone (MEDROL DOSEPAK) 4 MG TBPK tablet, Follow package insert, Disp: 21 each, Rfl: 0   naproxen (NAPROSYN) 500 MG tablet, Take 500 mg by mouth 2 (two) times daily., Disp: , Rfl:    QELBREE 200 MG 24 hr capsule, Take by mouth., Disp: , Rfl:    Testosterone 200 MG PLLT, Take by implantation route., Disp: , Rfl:    tirzepatide (ZEPBOUND) 2.5 MG/0.5ML Pen, Inject 2.5 mg into the skin once a week., Disp: 2 mL, Rfl: 0   triamcinolone cream (KENALOG) 0.5 %, Apply 1 Application topically 2 (two) times daily as  needed., Disp: , Rfl:    Review of Systems:   Review of Systems  Musculoskeletal:  Positive for back pain.  Negative unless otherwise specified per HPI.  Vitals:   Vitals:   07/12/23 1135 07/12/23 1205  BP: (!) 150/90 (!) 146/90  Pulse: 100   Temp: 98.8 F (37.1 C)   TempSrc: Temporal   Weight: 224 lb 6.1 oz (101.8 kg)   Height: 5\' 9"  (1.753 m)      Body mass index is 33.14 kg/m.  Physical Exam:   Physical Exam Vitals and nursing note reviewed.  Constitutional:      General: She is not in acute distress.    Appearance: She is well-developed. She is not ill-appearing or toxic-appearing.  Cardiovascular:     Rate and Rhythm: Normal rate and regular rhythm.     Pulses: Normal pulses.     Heart sounds: Normal heart sounds, S1 normal and S2 normal.  Pulmonary:     Effort: Pulmonary effort is normal.     Breath sounds: Normal breath sounds.  Musculoskeletal:     Comments: Tenderness to palpation to mid thoracic back   Skin:    General: Skin is warm and dry.  Neurological:     Mental Status: She is alert.     GCS: GCS eye subscore is 4. GCS verbal subscore is 5. GCS motor subscore is 6.  Psychiatric:        Speech: Speech normal.        Behavior: Behavior normal. Behavior is cooperative.     Assessment and Plan:   Acute midline  thoracic back pain Due to ongoing significant pain despite Medrol Dose pack and muscle relaxers, will refer urgently to sports medicine She has history of requiring low back surgery  Will refer to sports medicine to determine if more advanced imaging is required or other treatment modalities are available  Obesity (BMI 30.0-34.9) Great candidate for GLP-1  Reviewed risks and benefits/side effect(s) of this Start Zepbound 2.5 mg weekly Follow-up in 3 month(s), sooner if concerns  OSA (obstructive sleep apnea) Reports good relationship with current provider  Essential hypertension Above goal today No evidence of end-organ damage on my exam Recommend patient monitor home blood pressure at least a few times weekly Continue amlodipine 10 mg daily If home monitoring shows consistent elevation, or any symptom(s) develop, recommend reach out to [place] for further advice on next steps    Jarold Motto, PA-C  I,Safa M Kadhim,acting as a scribe for [employer], PA.,have documented all relevant documentation on the behalf of Jarold Motto, PA,as directed by  Jarold Motto, PA while in the presence of Jarold Motto, [place].   I, Jarold Motto, [place], have reviewed all documentation for this visit. The documentation on 07/12/23 for the exam, diagnosis, procedures, and orders are all accurate and complete.

## 2023-07-12 NOTE — Progress Notes (Unsigned)
 Rubin Payor, PhD, LAT, ATC acting as a scribe for Clementeen Graham, MD.  [name] is a 38 y.o. female who presents to [facility] today for thoracic back pain x ***. Pain started when she was driving. Pt was seen on Sunday at the [facility]  Radiates:  Mechanical symptoms: Numbness/tingling: Weakness: Aggravates: Treatments tried: lidocaine patches, methocarbamol, prednisone, Tylenol, Epsome salt warm bath  Pertinent review of systems: ***  Relevant historical information: ***   Exam:  There were no vitals taken for this visit. General: Well Developed, well nourished, and in no acute distress.   MSK: ***    Lab and Radiology Results Results for orders placed or performed during the hospital encounter of 07/11/23 (from the past 72 hours)  CBC with Differential     Status: None   Collection Time: 07/11/23  6:08 AM  Result Value Ref Range   WBC 7.1 4.0 - 10.5 K/uL   RBC 4.77 3.87 - 5.11 MIL/uL   Hemoglobin 15.0 12.0 - 15.0 g/dL   HCT 62.1 30.8 - 65.7 %   MCV 91.6 80.0 - 100.0 fL   MCH 31.4 26.0 - 34.0 pg   MCHC 34.3 30.0 - 36.0 g/dL   RDW 84.6 96.2 - 95.2 %   Platelets 362 150 - 400 K/uL   nRBC 0.0 0.0 - 0.2 %   Neutrophils Relative % 40 %   Neutro Abs 2.9 1.7 - 7.7 K/uL   Lymphocytes Relative 51 %   Lymphs Abs 3.6 0.7 - 4.0 K/uL   Monocytes Relative 6 %   Monocytes Absolute 0.4 0.1 - 1.0 K/uL   Eosinophils Relative 2 %   Eosinophils Absolute 0.1 0.0 - 0.5 K/uL   Basophils Relative 1 %   Basophils Absolute 0.1 0.0 - 0.1 K/uL   Immature Granulocytes 0 %   Abs Immature Granulocytes 0.02 0.00 - 0.07 K/uL    Comment: Performed at [facility], [address], [place], [place] [address]  Basic metabolic panel     Status: Abnormal   Collection Time: 07/11/23  6:08 AM  Result Value Ref Range   Sodium 139 135 - 145 mmol/L   Potassium 3.9 3.5 - 5.1 mmol/L   Chloride 101 98 - 111 mmol/L   CO2 30 22 - 32  mmol/L   Glucose, Bld 128 (H) 70 - 99 mg/dL    Comment: Glucose reference range applies only to samples taken after fasting for at least 8 hours.   BUN 6 6 - 20 mg/dL   Creatinine, Ser 4.40 (H) 0.44 - 1.00 mg/dL   Calcium 9.3 8.9 - 10.2 mg/dL   GFR, Estimated >72 >53 mL/min    Comment: (NOTE) Calculated using the CKD-EPI Creatinine Equation (2021)    Anion gap 8 5 - 15    Comment: Performed at [facility], [address] [place], [place] [address]  Troponin I (High Sensitivity)     Status: None   Collection Time: 07/11/23  6:08 AM  Result Value Ref Range   Troponin I (High Sensitivity) 15 <18 ng/L    Comment: (NOTE) Elevated high sensitivity troponin I (hsTnI) values and significant  changes across serial measurements may suggest ACS but many other  chronic and acute conditions are known to elevate hsTnI results.  Refer to the "Links" section for chest pain algorithms and additional  guidance. Performed at [facility], [address], [place], [place] [address]   D-dimer, quantitative  Status: None   Collection Time: 07/11/23  6:08 AM  Result Value Ref Range   D-Dimer, Quant <0.27 0.00 - 0.50 ug/mL-FEU    Comment: (NOTE) At the manufacturer cut-off value of 0.5 g/mL FEU, this assay has a negative predictive value of 95-100%.This assay is intended for use in conjunction with a clinical pretest probability (PTP) assessment model to exclude pulmonary embolism (PE) and deep venous thrombosis (DVT) in outpatients suspected of PE or DVT. Results should be correlated with clinical presentation. Performed at [facility], [address] [place], [place] [address]   hCG, serum, qualitative     Status: None   Collection Time: 07/11/23  6:08 AM  Result Value Ref Range   Preg, Serum NEGATIVE NEGATIVE    Comment:        THE SENSITIVITY OF THIS METHODOLOGY IS >10 mIU/mL. Performed at [facility],  [address] [place], [place] [address]   Troponin I (High Sensitivity)     Status: None   Collection Time: 07/11/23  7:42 AM  Result Value Ref Range   Troponin I (High Sensitivity) 13 <18 ng/L    Comment: (NOTE) Elevated high sensitivity troponin I (hsTnI) values and significant  changes across serial measurements may suggest ACS but many other  chronic and acute conditions are known to elevate hsTnI results.  Refer to the "Links" section for chest pain algorithms and additional  guidance. Performed at [facility], [address], [place], [place] [address]    CT Angio Chest/Abd/Pel for Dissection W and/or Wo Contrast Result Date: 07/11/2023 CLINICAL DATA:  Mid back and chest pain. EXAM: CT ANGIOGRAPHY CHEST, ABDOMEN AND PELVIS TECHNIQUE: Non-contrast CT of the chest was initially obtained. Multidetector CT imaging through the chest, abdomen and pelvis was performed using the standard protocol during bolus administration of intravenous contrast. Multiplanar reconstructed images and MIPs were obtained and reviewed to evaluate the vascular anatomy. RADIATION DOSE REDUCTION: This exam was performed according to the departmental dose-optimization program which includes automated exposure control, adjustment of the mA and/or kV according to patient size and/or use of iterative reconstruction technique. CONTRAST:  OMNIPAQUE IOHEXOL 350 MG/ML SOLN COMPARISON:  None Available. FINDINGS: CTA CHEST FINDINGS Cardiovascular: Preferential opacification of the thoracic aorta. No evidence of thoracic aortic aneurysm or dissection. Normal heart size. No pericardial effusion. Mediastinum/Nodes: No hematoma. Lungs/Pleura: There is no edema, consolidation, effusion, or pneumothorax. Mild dependent atelectasis. Musculoskeletal: No acute finding Review of the MIP images confirms the above findings. CTA ABDOMEN AND PELVIS FINDINGS VASCULAR Aorta: Vessels are smoothly contoured and widely  patent. Celiac: Normal SMA: Normal Renals: Normal IMA: Patent Inflow: Normal Veins: Unremarkable in the arterial phase Review of the MIP images confirms the above findings. NON-VASCULAR Hepatobiliary: No focal liver abnormality.Calcified gallstones. No evidence of biliary obstruction or inflammation. Pancreas: Unremarkable. Spleen: Unremarkable. Adrenals/Urinary Tract: Negative adrenals. No hydronephrosis or stone. Unremarkable bladder. Stomach/Bowel:  No obstruction. No appendicitis. Lymphatic: No mass or adenopathy. Reproductive:No pathologic findings. Other: No ascites or pneumoperitoneum. Musculoskeletal: No acute abnormalities.  L2-S1 solid arthrodesis. Review of the MIP images confirms the above findings. IMPRESSION: 1. Normal CTA of the aorta. 2. Cholelithiasis. 3. Mild bilateral atelectasis. Electronically Signed   By: Tiburcio Pea M.D.   On: 07/11/2023 08:00   CT T-SPINE NO CHARGE Result Date: 07/11/2023 CLINICAL DATA:  Mid back pain EXAM: CT Thoracic Spine without contrast TECHNIQUE: Multiplanar CT images of the thoracic spine were reconstructed from contemporary CT of the Chest. RADIATION DOSE REDUCTION:  This exam was performed according to the departmental dose-optimization program which includes automated exposure control, adjustment of the mA and/or kV according to patient size and/or use of iterative reconstruction technique. CONTRAST:  None COMPARISON:  None Available. FINDINGS: Alignment: Exaggerated thoracic kyphosis.  No listhesis. Vertebrae: No acute fracture or focal pathologic process. Paraspinal and other soft tissues: Negative. Disc levels: Minimal endplate spurring in the midthoracic spine. No evidence of bony impingement IMPRESSION: No acute or focal finding. Electronically Signed   By: Tiburcio Pea M.D.   On: 07/11/2023 07:43       Assessment and Plan: 38 y.o. female with ***   PDMP not reviewed this encounter. No orders of the defined types were placed in this  encounter.  No orders of the defined types were placed in this encounter.    Discussed warning signs or symptoms. Please see discharge instructions. Patient expresses understanding.   ***

## 2023-07-13 ENCOUNTER — Ambulatory Visit

## 2023-07-13 ENCOUNTER — Encounter: Payer: Self-pay | Admitting: Family Medicine

## 2023-07-13 ENCOUNTER — Telehealth: Payer: Self-pay

## 2023-07-13 ENCOUNTER — Ambulatory Visit (INDEPENDENT_AMBULATORY_CARE_PROVIDER_SITE_OTHER): Admitting: Family Medicine

## 2023-07-13 ENCOUNTER — Other Ambulatory Visit: Payer: Self-pay | Admitting: Physician Assistant

## 2023-07-13 ENCOUNTER — Telehealth: Payer: Self-pay | Admitting: *Deleted

## 2023-07-13 ENCOUNTER — Other Ambulatory Visit (HOSPITAL_COMMUNITY): Payer: Self-pay

## 2023-07-13 VITALS — BP 122/100 | HR 93 | Ht 69.0 in | Wt 225.0 lb

## 2023-07-13 DIAGNOSIS — M546 Pain in thoracic spine: Secondary | ICD-10-CM

## 2023-07-13 DIAGNOSIS — M5124 Other intervertebral disc displacement, thoracic region: Secondary | ICD-10-CM | POA: Diagnosis not present

## 2023-07-13 MED ORDER — KETOROLAC TROMETHAMINE 60 MG/2ML IM SOLN
60.0000 mg | Freq: Once | INTRAMUSCULAR | Status: AC
Start: 2023-07-13 — End: 2023-07-13
  Administered 2023-07-13: 60 mg via INTRAMUSCULAR

## 2023-07-13 MED ORDER — TRAMADOL HCL 50 MG PO TABS: 50.0000 mg | ORAL_TABLET | Freq: Three times a day (TID) | ORAL | 0 refills | Status: DC | PRN

## 2023-07-13 NOTE — Telephone Encounter (Signed)
 Pharmacy Patient Advocate Encounter   Received notification from Pt Calls Messages that prior authorization for Zepbound 2.5MG /0.5ML pen-injectors is required/requested.   Insurance verification completed.   The patient is insured through [place] .   Per test claim: PA required; PA submitted to above mentioned insurance via CoverMyMeds Key/confirmation #/EOC [id number] Status is pending

## 2023-07-13 NOTE — Telephone Encounter (Signed)
 PA needed for Zepbound 2.5 mg

## 2023-07-13 NOTE — Telephone Encounter (Signed)
 PA request has been Submitted. New Encounter has been or will be created for follow up. For additional info see Pharmacy Prior Auth telephone encounter from 07/13/23.

## 2023-07-13 NOTE — Patient Instructions (Addendum)
 Thank you for coming in today.  A referral has been placed for Physical Therapy with [facility]  A order has been placed for a MRI of your thoracic spine.  Intramuscular Toradol today.   See you back for MRI review.

## 2023-07-14 ENCOUNTER — Encounter: Payer: Self-pay | Admitting: Family Medicine

## 2023-07-14 ENCOUNTER — Other Ambulatory Visit: Payer: Self-pay | Admitting: Physician Assistant

## 2023-07-14 ENCOUNTER — Other Ambulatory Visit (HOSPITAL_COMMUNITY): Payer: Self-pay

## 2023-07-14 DIAGNOSIS — M542 Cervicalgia: Secondary | ICD-10-CM | POA: Diagnosis not present

## 2023-07-14 DIAGNOSIS — M532X2 Spinal instabilities, cervical region: Secondary | ICD-10-CM | POA: Diagnosis not present

## 2023-07-14 DIAGNOSIS — M546 Pain in thoracic spine: Secondary | ICD-10-CM

## 2023-07-14 NOTE — Progress Notes (Signed)
 Thoracic spine MRI shows a little bit of bulging disks but no pinched nerves.  I think physical therapy should help.  If needed we could try a injection in your back.  To do that I would need to order it through the radiology office but I do recommend physical therapy first but if you really want a shot happy to order it.

## 2023-07-14 NOTE — Telephone Encounter (Signed)
 Pharmacy Patient Advocate Encounter  Received notification from  [facility] [employer]  that Prior Authorization for ZEPBOUND has been APPROVED from 07/12/23 to 02/07/24   PA #/Case ID/Reference #: [id number]

## 2023-07-16 NOTE — Addendum Note (Signed)
 Addended by: Rodolph Bong on: 07/16/2023 12:03 PM   Modules accepted: Orders

## 2023-07-17 DIAGNOSIS — M546 Pain in thoracic spine: Secondary | ICD-10-CM | POA: Diagnosis not present

## 2023-07-19 ENCOUNTER — Encounter: Payer: Self-pay | Admitting: Family Medicine

## 2023-07-19 NOTE — Telephone Encounter (Signed)
 Forwarding to Dr. Denyse Amass to advise regarding appropriate dose of Meloxicam.    Renal labs 07/11/23  Component Ref Range & Units (hover) 8 d ago 8 mo ago 10 mo ago 11 mo ago 1 yr ago 2 yr ago 5 yr ago  Sodium 139 136 R 138 136 R 135 139 136  Potassium 3.9 5.0 R 3.9 4.1 R 3.8 3.5 3.6  Chloride 101 98 R 103 101 R 99 103 104  CO2 30 29 R  27 R 26 29 24   Glucose, Bld 128 High  85 94 CM 87 125 High  CM 106 High  CM 87  Comment: Glucose reference range applies only to samples taken after fasting for at least 8 hours.  BUN 6 10 R 9 11 R 14 <5 Low  8  Creatinine, Ser 1.05 High  1.03 R 0.90 1.13 R 1.11 High  0.94 0.74  Calcium 9.3 9.9 R  9.7 R 10.2 9.5 8.9  GFR, Estimated >60    >60 CM >60 CM   Comment: (NOTE) Calculated using the CKD-EPI Creatinine Equation (2021)  Anion gap 8    10 CM 7 CM 8 CM    Please also note that BP on 07/13/23 was 122/100.

## 2023-07-21 MED ORDER — HYDROCODONE-ACETAMINOPHEN 5-325 MG PO TABS: 1.0000 | ORAL_TABLET | Freq: Four times a day (QID) | ORAL | 0 refills | Status: DC | PRN

## 2023-07-21 NOTE — Telephone Encounter (Signed)
 I called Mrs. [name] and prescribed hydrocodone.  Her epidural scheduled for Friday.

## 2023-07-22 NOTE — Discharge Instructions (Signed)

## 2023-07-22 NOTE — Telephone Encounter (Signed)
 Noted.

## 2023-07-23 ENCOUNTER — Ambulatory Visit
Admission: RE | Admit: 2023-07-23 | Discharge: 2023-07-23 | Disposition: A | Discharge status: Home / Self Care | Source: Ambulatory Visit | Attending: Family Medicine | Admitting: Family Medicine

## 2023-07-23 DIAGNOSIS — M4724 Other spondylosis with radiculopathy, thoracic region: Secondary | ICD-10-CM | POA: Diagnosis not present

## 2023-07-23 DIAGNOSIS — M5114 Intervertebral disc disorders with radiculopathy, thoracic region: Secondary | ICD-10-CM | POA: Diagnosis not present

## 2023-07-23 DIAGNOSIS — M546 Pain in thoracic spine: Secondary | ICD-10-CM

## 2023-07-23 MED ORDER — IOPAMIDOL (ISOVUE-M 300) INJECTION 61%
1.0000 mL | Freq: Once | INTRAMUSCULAR | Status: AC | PRN
  Administered 2023-07-23: 1 mL via EPIDURAL

## 2023-07-23 MED ORDER — TRIAMCINOLONE ACETONIDE 40 MG/ML IJ SUSP (RADIOLOGY)
60.0000 mg | Freq: Once | INTRAMUSCULAR | Status: AC
  Administered 2023-07-23: 60 mg via EPIDURAL

## 2023-07-27 ENCOUNTER — Other Ambulatory Visit (HOSPITAL_COMMUNITY): Payer: Self-pay

## 2023-08-18 DIAGNOSIS — F419 Anxiety disorder, unspecified: Secondary | ICD-10-CM | POA: Diagnosis not present

## 2023-08-18 DIAGNOSIS — F909 Attention-deficit hyperactivity disorder, unspecified type: Secondary | ICD-10-CM | POA: Diagnosis not present

## 2023-08-18 DIAGNOSIS — F329 Major depressive disorder, single episode, unspecified: Secondary | ICD-10-CM | POA: Diagnosis not present

## 2023-09-18 ENCOUNTER — Encounter (HOSPITAL_BASED_OUTPATIENT_CLINIC_OR_DEPARTMENT_OTHER): Payer: Self-pay | Admitting: *Deleted

## 2023-09-18 ENCOUNTER — Other Ambulatory Visit: Payer: Self-pay

## 2023-09-18 ENCOUNTER — Emergency Department (HOSPITAL_BASED_OUTPATIENT_CLINIC_OR_DEPARTMENT_OTHER)
Admission: EM | Admit: 2023-09-18 | Discharge: 2023-09-18 | Disposition: A | Discharge status: Home / Self Care | Attending: Emergency Medicine | Admitting: Emergency Medicine

## 2023-09-18 DIAGNOSIS — Z114 Encounter for screening for human immunodeficiency virus [HIV]: Secondary | ICD-10-CM | POA: Insufficient documentation

## 2023-09-18 DIAGNOSIS — Z206 Contact with and (suspected) exposure to human immunodeficiency virus [HIV]: Secondary | ICD-10-CM | POA: Diagnosis not present

## 2023-09-18 DIAGNOSIS — Z91040 Latex allergy status: Secondary | ICD-10-CM | POA: Diagnosis not present

## 2023-09-18 DIAGNOSIS — Z7189 Other specified counseling: Secondary | ICD-10-CM

## 2023-09-18 DIAGNOSIS — F419 Anxiety disorder, unspecified: Secondary | ICD-10-CM | POA: Insufficient documentation

## 2023-09-18 LAB — COMPREHENSIVE METABOLIC PANEL WITH GFR
ALT: 24 U/L (ref 0–44)
AST: 21 U/L (ref 15–41)
Albumin: 4.2 g/dL (ref 3.5–5.0)
Alkaline Phosphatase: 80 U/L (ref 38–126)
Anion gap: 13 (ref 5–15)
BUN: 14 mg/dL (ref 6–20)
CO2: 25 mmol/L (ref 22–32)
Calcium: 9.6 mg/dL (ref 8.9–10.3)
Chloride: 100 mmol/L (ref 98–111)
Creatinine, Ser: 1.06 mg/dL — ABNORMAL HIGH (ref 0.44–1.00)
GFR, Estimated: >60 mL/min (ref >60)
Glucose, Bld: 123 mg/dL — ABNORMAL HIGH (ref 70–99)
Potassium: 3.7 mmol/L (ref 3.5–5.1)
Sodium: 137 mmol/L (ref 135–145)
Total Bilirubin: 0.4 mg/dL (ref 0.0–1.2)
Total Protein: 7.2 g/dL (ref 6.5–8.1)

## 2023-09-18 MED ORDER — BICTEGRAVIR-EMTRICITAB-TENOFOV 50-200-25 MG PO PREPACK: 1.0000 | ORAL_TABLET | Freq: Every day | ORAL | Status: DC

## 2023-09-18 MED ORDER — BICTEGRAVIR-EMTRICITAB-TENOFOV 50-200-25 MG PO TABS
1.0000 | ORAL_TABLET | Freq: Every day | ORAL | 0 refills | Status: DC
Start: 2023-09-18 — End: 2023-09-18

## 2023-09-18 MED ORDER — DOLUTEGRAVIR SODIUM 50 MG PO TABS: 50.0000 mg | ORAL_TABLET | Freq: Every day | ORAL | 0 refills | Status: DC

## 2023-09-18 MED ORDER — EMTRICITABINE-TENOFOVIR DF 200-300 MG PO TABS: 1.0000 | ORAL_TABLET | Freq: Every day | ORAL | 0 refills | Status: DC

## 2023-09-18 NOTE — ED Provider Notes (Signed)
 [facility] DEPARTMENT AT [facility] Provider Note   CSN: [id number] Arrival date & time: 09/18/23  1921     History  Chief Complaint  Patient presents with   Exposure to STD    [name] is a 38 y.o. female here with concern for possible HIV exposure.  Patient had unprotected sex with a female 2 days ago.  She is concerned that he may be lying to her.  She called her doctor and was told that she may need HIV prophylaxis.  Patient is here to get blood work and HIV prophylaxis.  Patient denies any vaginal discharge.  She states that she is seeing her GYN doctor on Monday for full evaluation and does not want a pelvic exam currently.  Denies any fevers or chills  The history is provided by the patient.       Home Medications Prior to Admission medications   Medication Sig Start Date End Date Taking? Authorizing Provider  bictegravir-emtricitabine-tenofovir AF (BIKTARVY) 50-200-25 MG TABS tablet Take 1 tablet by mouth daily. 09/18/23 10/18/23 Yes Dalene Duck, MD  emtricitabine-tenofovir (TRUVADA) 200-300 MG tablet Take 1 tablet by mouth daily. 09/18/23  Yes Dalene Duck, MD  albuterol  (VENTOLIN  HFA) 108 [id number] Base) MCG/ACT inhaler Inhale 1-2 puffs into the lungs every 6 (six) hours as needed for wheezing or shortness of breath.    [provider]  amLODipine  (NORVASC ) 10 MG tablet Take 1 tablet (10 mg total) by mouth daily. 12/28/22   Alexander Iba, PA  busPIRone (BUSPAR) 5 MG tablet Take 5 mg by mouth 3 (three) times daily. 07/07/23   [provider]  Cholecalciferol 50 MCG (2000 UT) CAPS Take 1 capsule by mouth daily.    [provider]  Ferrous Sulfate  (IRON) 325 (65 Fe) MG TABS Take 1 tablet by mouth daily after lunch.    [provider]  HYDROcodone -acetaminophen  (NORCO/VICODIN) 5-325 MG tablet Take 1 tablet by mouth every 6 (six) hours as needed. 07/21/23   Corey, Evan S, MD  methocarbamol  (ROBAXIN ) 500 MG tablet  Take 1 tablet (500 mg total) by mouth 2 (two) times daily. 07/11/23   Curatolo, Adam, DO  methylPREDNISolone  (MEDROL  DOSEPAK) 4 MG TBPK tablet Follow package insert 07/11/23   Curatolo, Adam, DO  naproxen  (NAPROSYN ) 500 MG tablet Take 500 mg by mouth 2 (two) times daily. 07/11/23   [provider]  QELBREE 200 MG 24 hr capsule Take by mouth. 02/19/23   [provider]  Testosterone  200 MG PLLT Take by implantation route.    [provider]  tirzepatide  (ZEPBOUND ) 2.5 MG/0.5ML Pen Inject 2.5 mg into the skin once a week. 07/12/23   Alexander Iba, PA  traMADol  (ULTRAM ) 50 MG tablet Take 1 tablet (50 mg total) by mouth every 8 (eight) hours as needed for severe pain (pain score 7-10). 07/13/23   Syliva Even, MD  triamcinolone  cream (KENALOG ) 0.5 % Apply 1 Application topically 2 (two) times daily as needed. 07/13/22   [provider]      Allergies    Latex, Morphine  and codeine, Doxycycline, and Penicillins    Review of Systems   Review of Systems  All other systems reviewed and are negative.   Physical Exam Updated Vital Signs BP (!) 150/112   Pulse (!) 105   Temp 98.2 F (36.8 C)   Resp 20   Ht 5\' 9"  (1.753 m)   Wt 101.2 kg   SpO2 98%   BMI 32.93  kg/m  Physical Exam Vitals and nursing note reviewed.  Constitutional:      Appearance: Normal appearance.     Comments: Slightly anxious  HENT:     Head: Normocephalic.     Nose: Nose normal.     Mouth/Throat:     Mouth: Mucous membranes are moist.  Eyes:     Extraocular Movements: Extraocular movements intact.     Pupils: Pupils are equal, round, and reactive to light.  Cardiovascular:     Rate and Rhythm: Normal rate and regular rhythm.     Pulses: Normal pulses.     Heart sounds: Normal heart sounds.  Pulmonary:     Effort: Pulmonary effort is normal.     Breath sounds: Normal breath sounds.  Abdominal:     General: Abdomen is flat.     Palpations: Abdomen is soft.  Musculoskeletal:         General: Normal range of motion.     Cervical back: Normal range of motion and neck supple.  Skin:    General: Skin is warm.     Capillary Refill: Capillary refill takes less than 2 seconds.  Neurological:     General: No focal deficit present.     Mental Status: She is alert and oriented to person, place, and time.  Psychiatric:        Mood and Affect: Mood normal.        Behavior: Behavior normal.     ED Results / Procedures / Treatments   Labs (all labs ordered are listed, but only abnormal results are displayed) Labs Reviewed  HIV ANTIBODY (ROUTINE TESTING W REFLEX)  COMPREHENSIVE METABOLIC PANEL WITH GFR  HEPATITIS C ANTIBODY  HEPATITIS B SURFACE ANTIGEN  RPR    EKG None  Radiology No results found.  Procedures Procedures    Medications Ordered in ED Medications - No data to display  ED Course/ Medical Decision Making/ A&P                                 Medical Decision Making [name] is a 38 y.o. female here presenting with concern for possible HIV exposure.  Patient had unprotected sex 2 days ago.  Patient requesting HIV prophylaxis.  Patient states that her tubes are tied so she is not pregnant.  Per our protocol, patient will receive Biktarvy but patient specifically requested Truvada.   Problems Addressed: Encounter for HIV screening and discussion of pre-exposure prophylaxis for HIV: acute illness or injury  Amount and/or Complexity of Data Reviewed Labs: ordered.  Risk Prescription drug management.    Final Clinical Impression(s) / ED Diagnoses Final diagnoses:  Encounter for HIV screening and discussion of pre-exposure prophylaxis for HIV    Rx / DC Orders ED Discharge Orders          Ordered    bictegravir-emtricitabine-tenofovir AF (BIKTARVY) 50-200-25 MG TABS tablet  Daily        09/18/23 2017    emtricitabine-tenofovir (TRUVADA) 200-300 MG tablet  Daily        09/18/23 2035              Dalene Duck,  MD 09/18/23 2037

## 2023-09-18 NOTE — ED Triage Notes (Signed)
 Patient to ED reporting a possible HIV exposure through a sexual partner on Thursday. No symptoms but patient wanting to have testing performed.

## 2023-09-18 NOTE — Discharge Instructions (Addendum)
 As we discussed, you should take Truvada and Tivicay daily for a month to help prevent HIV   We have sent off HIV and RPR and you should see the results in MyChart in 2 to 3 days  Even if you have a negative HIT test, you need to have repeat HIV test in a month  Return to ER if you have fever or vomiting

## 2023-09-19 LAB — HEPATITIS B SURFACE ANTIGEN: Hepatitis B Surface Ag: NONREACTIVE

## 2023-09-19 LAB — HIV ANTIBODY (ROUTINE TESTING W REFLEX): HIV Screen 4th Generation wRfx: NONREACTIVE

## 2023-09-19 LAB — HEPATITIS C ANTIBODY: HCV Ab: NONREACTIVE

## 2023-09-20 DIAGNOSIS — Z01419 Encounter for gynecological examination (general) (routine) without abnormal findings: Secondary | ICD-10-CM | POA: Diagnosis not present

## 2023-09-20 LAB — RPR: RPR Ser Ql: NONREACTIVE

## 2023-10-27 DIAGNOSIS — E039 Hypothyroidism, unspecified: Secondary | ICD-10-CM | POA: Diagnosis not present

## 2023-10-27 DIAGNOSIS — N951 Menopausal and female climacteric states: Secondary | ICD-10-CM | POA: Diagnosis not present

## 2023-10-27 DIAGNOSIS — Z79890 Hormone replacement therapy (postmenopausal): Secondary | ICD-10-CM | POA: Diagnosis not present

## 2023-10-27 DIAGNOSIS — R6882 Decreased libido: Secondary | ICD-10-CM | POA: Diagnosis not present

## 2023-10-27 DIAGNOSIS — R232 Flushing: Secondary | ICD-10-CM | POA: Diagnosis not present

## 2023-10-29 DIAGNOSIS — N951 Menopausal and female climacteric states: Secondary | ICD-10-CM | POA: Diagnosis not present

## 2023-11-03 DIAGNOSIS — F909 Attention-deficit hyperactivity disorder, unspecified type: Secondary | ICD-10-CM | POA: Diagnosis not present

## 2023-11-03 DIAGNOSIS — F419 Anxiety disorder, unspecified: Secondary | ICD-10-CM | POA: Diagnosis not present

## 2023-11-03 DIAGNOSIS — F329 Major depressive disorder, single episode, unspecified: Secondary | ICD-10-CM | POA: Diagnosis not present

## 2023-11-10 ENCOUNTER — Ambulatory Visit: Admitting: Physician Assistant

## 2023-11-11 ENCOUNTER — Encounter: Payer: Self-pay | Admitting: Physician Assistant

## 2023-11-11 ENCOUNTER — Ambulatory Visit (INDEPENDENT_AMBULATORY_CARE_PROVIDER_SITE_OTHER): Admitting: Physician Assistant

## 2023-11-11 VITALS — BP 110/80 | HR 99 | Temp 98.4°F | Ht 69.0 in | Wt 225.0 lb

## 2023-11-11 DIAGNOSIS — E66811 Obesity, class 1: Secondary | ICD-10-CM

## 2023-11-11 DIAGNOSIS — G4733 Obstructive sleep apnea (adult) (pediatric): Secondary | ICD-10-CM | POA: Diagnosis not present

## 2023-11-11 NOTE — Progress Notes (Signed)
 [name] is a 38 y.o. female here for a follow up of a pre-existing problem.  History of Present Illness:   Chief Complaint  Patient presents with   Weight Management Screening    Pt never started on Zepbound , has questions.    HPI  Obesity Patient was prescribed Zepbound  2.5 mg but has not started it yet due to having questions and working overtime.  She has questions about medication possibly reducing muscle mass before starting the medication.  Also worried about not having an adequate food intake due to the effects of the medications.   Sleep Apnea  Patient has previously using a CPAP but is no longer using it due to losing her mouth piece and due to shifting her teeth after several weeks of using it.  Her psychiatrist had recommend prescribing a medications but she was not interested in it.   Past Medical History:  Diagnosis Date   ADHD (attention deficit hyperactivity disorder)    Asthma    Atypical chest pain    ED visit in epic 07-17-2022;  referred and evaluated by cardiologist-- dr m. branch , note in epic 08-26-2022   Complication of anesthesia    trouble waking up   History of gestational diabetes    History of pregnancy induced hypertension    HSV (herpes simplex virus) anogenital infection    Hypertension    Menorrhagia    Wears contact lenses      Social History   Tobacco Use   Smoking status: Never   Smokeless tobacco: Never  Vaping Use   Vaping status: Every Day   Substances: Nicotine, Flavoring   Devices: geek  Substance Use Topics   Alcohol  use: Yes    Comment: socially   Drug use: Never    Past Surgical History:  Procedure Laterality Date   CESAREAN SECTION N/A 05/06/2018   Procedure: CESAREAN SECTION;  Surgeon: Rosalva Sawyer, MD;  Location: [facility]ING SUIT[facility];  Service: Obstetrics;  Laterality: N/A;   DIAGNOSTIC LAPAROSCOPY  05/03/2007   @[facility]  ;  for chronic pelvic pain   ENDOMETRIAL ABLATION N/A 09/18/2022   Procedure: ENDOMETRIAL  ABLATION WITH NOVASURE;  Surgeon: Rutherford Gain, MD;  Location: [facility];  Service: Gynecology;  Laterality: N/A;   LUMBAR FUSION  2017   in [place] ;  L2--L5   TUBAL LIGATION N/A 09/18/2022   Procedure: LAPARASCOPIC BILATERAL TUBAL LIGATION;  Surgeon: Rutherford Gain, MD;  Location: [facility] [place];  Service: Gynecology;  Laterality: N/A;    Family History  Problem Relation Age of Onset   Hypertension Mother    Asthma Mother    Thyroid  disease Mother    Asthma Father    Hypertension Father    Heart disease Father    Hypertension Sister    Heart attack Maternal Grandmother    Heart attack Paternal Grandmother    Diabetes Paternal Grandmother    Stroke Paternal Grandmother    Kidney disease Paternal Grandmother    Thyroid  disease Paternal Grandmother    Heart attack Paternal Grandfather    COPD Paternal Grandfather    Anesthesia problems Neg Hx    Hypotension Neg Hx    Malignant hyperthermia Neg Hx    Pseudochol deficiency Neg Hx     Allergies  Allergen Reactions   Latex Itching   Morphine  And Codeine Itching    Lower doses OK   Doxycycline Rash   Penicillins Rash and Other (See Comments)    Childhood reaction ( unknown) Has  patient had a PCN reaction causing immediate rash, facial/tongue/throat swelling, SOB or lightheadedness with hypotension: Yes Has patient had a PCN reaction causing severe rash involving mucus membranes or skin necrosis: No Has patient had a PCN reaction that required hospitalization: No Has patient had a PCN reaction occurring within the last 10 years: No If all of the above answers are NO, then may proceed with Cephalosporin use.     Current Medications:   Current Outpatient Medications:    albuterol  (VENTOLIN  HFA) 108 (90 Base) MCG/ACT inhaler, Inhale 1-2 puffs into the lungs every 6 (six) hours as needed for wheezing or shortness of breath., Disp: , Rfl:    busPIRone (BUSPAR) 5 MG tablet, Take 5 mg by  mouth 3 (three) times daily. (Patient taking differently: Take 5 mg by mouth 3 (three) times daily as needed.), Disp: , Rfl:    Ferrous Sulfate  (IRON) 325 (65 Fe) MG TABS, Take 1 tablet by mouth daily after lunch., Disp: , Rfl:    Testosterone  200 MG PLLT, Take by implantation route., Disp: , Rfl:    triamcinolone  cream (KENALOG ) 0.5 %, Apply 1 Application topically 2 (two) times daily as needed., Disp: , Rfl:    amLODipine  (NORVASC ) 10 MG tablet, Take 1 tablet (10 mg total) by mouth daily. (Patient not taking: Reported on 11/11/2023), Disp: 90 tablet, Rfl: 1   Cholecalciferol 50 MCG (2000 UT) CAPS, Take 1 capsule by mouth daily., Disp: , Rfl:    desvenlafaxine (PRISTIQ) 50 MG 24 hr tablet, Take 50 mg by mouth daily. (Patient not taking: Reported on 11/11/2023), Disp: , Rfl:    tirzepatide  (ZEPBOUND ) 2.5 MG/0.5ML Pen, Inject 2.5 mg into the skin once a week. (Patient not taking: Reported on 11/11/2023), Disp: 2 mL, Rfl: 0   Review of Systems:   ROS Negative unless otherwise specified per HPI.  Vitals:   Vitals:   11/11/23 1114  BP: 110/80  Pulse: 99  Temp: 98.4 F (36.9 C)  TempSrc: Temporal  SpO2: 98%  Weight: 225 lb (102.1 kg)  Height: 5' 9 (1.753 m)     Body mass index is 33.23 kg/m.  Physical Exam:   Physical Exam Vitals and nursing note reviewed.  Constitutional:      General: She is not in acute distress.    Appearance: She is well-developed. She is not ill-appearing or toxic-appearing.  Cardiovascular:     Rate and Rhythm: Normal rate and regular rhythm.     Pulses: Normal pulses.     Heart sounds: Normal heart sounds, S1 normal and S2 normal.  Pulmonary:     Effort: Pulmonary effort is normal.     Breath sounds: Normal breath sounds.  Skin:    General: Skin is warm and dry.  Neurological:     Mental Status: She is alert.     GCS: GCS eye subscore is 4. GCS verbal subscore is 5. GCS motor subscore is 6.  Psychiatric:        Speech: Speech normal.        Behavior:  Behavior normal. Behavior is cooperative.     Assessment and Plan:   OSA (obstructive sleep apnea) Continue close management with sleep medicine provider  Obesity (BMI 30.0-34.9) Reviewed risks and benefits/side effect(s) of Zepbound  Follow up in 3 month(s), sooner if concerns   Lucie Buttner, PA-C  I,Safa M Kadhim,acting as a scribe for Lucie Buttner, PA.,have documented all relevant documentation on the behalf of Lucie Buttner, PA,as directed by  Lucie Buttner, PA while  in the presence of Lucie Buttner, [place].   I, Lucie Buttner, [place], have reviewed all documentation for this visit. The documentation on 11/11/23 for the exam, diagnosis, procedures, and orders are all accurate and complete.

## 2023-11-22 DIAGNOSIS — F419 Anxiety disorder, unspecified: Secondary | ICD-10-CM | POA: Diagnosis not present

## 2023-11-22 DIAGNOSIS — F329 Major depressive disorder, single episode, unspecified: Secondary | ICD-10-CM | POA: Diagnosis not present

## 2023-11-22 DIAGNOSIS — F909 Attention-deficit hyperactivity disorder, unspecified type: Secondary | ICD-10-CM | POA: Diagnosis not present

## 2023-11-26 ENCOUNTER — Ambulatory Visit: Admitting: Physician Assistant

## 2023-11-29 ENCOUNTER — Telehealth: Admitting: Physician Assistant

## 2023-11-29 ENCOUNTER — Encounter: Payer: Self-pay | Admitting: Physician Assistant

## 2023-11-29 VITALS — Ht 69.0 in | Wt 219.0 lb

## 2023-11-29 DIAGNOSIS — Z6832 Body mass index (BMI) 32.0-32.9, adult: Secondary | ICD-10-CM | POA: Diagnosis not present

## 2023-11-29 DIAGNOSIS — E66811 Obesity, class 1: Secondary | ICD-10-CM

## 2023-11-29 MED ORDER — TIRZEPATIDE-WEIGHT MANAGEMENT 5 MG/0.5ML ~~LOC~~ SOAJ: 5.0000 mg | SUBCUTANEOUS | 1 refills | Status: DC

## 2023-11-29 NOTE — Progress Notes (Signed)
 I acted as a Neurosurgeon for [employer], PA-C Arland Chute, LPN  Virtual Visit via Video Note   I, Lucie Buttner, PA, connected with  [name]  ([id number], [birth year]) on 11/29/23 at 10:40 AM EDT by a video-enabled telemedicine application and verified that I am speaking with the correct person using two identifiers.  Location: Patient: Home Provider: [place] office   I discussed the limitations of evaluation and management by telemedicine and the availability of in person appointments. The patient expressed understanding and agreed to proceed.    History of Present Illness: [name] is a 38 y.o. who identifies as a female who was assigned female at birth, and is being seen today for weight management.   She is currently talking Zepbound  2.5 mg.  She developed a rash about 2 weeks ago.  She started two medications at the same time Zepbound  and Pristiq, not sure which she reacted too.  The rash was on her leg.  She administered the Zepbound  in her abdomen. Patient stopped the Pristiq and said the rash is improving.  She is not having any negative side effect(s) from the Zepbound ; reports there is no nausea, constipation.  Wt Readings from Last 3 Encounters:  11/29/23 219 lb (99.3 kg)  11/11/23 225 lb (102.1 kg)  09/18/23 223 lb (101.2 kg)    Problems:  Patient Active Problem List   Diagnosis Date Noted   Excessive daytime sleepiness 01/07/2023   Insomnia 01/07/2023   Obesity (BMI 30.0-34.9) 01/07/2023   Attention deficit hyperactivity disorder 08/05/2022   Bilateral ovarian cysts 08/05/2022   History of gestational diabetes mellitus 08/05/2022   Pure hypercholesterolemia 08/05/2022   Recurrent major depression in remission (HCC) 08/05/2022   Essential hypertension 08/05/2022   DDD (degenerative disc disease), lumbosacral 10/03/2020   Displacement of lumbar intervertebral disc without myelopathy 10/03/2020   S/P spinal fusion  10/03/2020   Lumbar post-laminectomy syndrome 06/27/2020   Tobacco use 12/10/2011    Allergies:  Allergies  Allergen Reactions   Latex Itching   Morphine  And Codeine Itching    Lower doses OK   Doxycycline Rash   Penicillins Rash and Other (See Comments)    Childhood reaction ( unknown) Has patient had a PCN reaction causing immediate rash, facial/tongue/throat swelling, SOB or lightheadedness with hypotension: Yes Has patient had a PCN reaction causing severe rash involving mucus membranes or skin necrosis: No Has patient had a PCN reaction that required hospitalization: No Has patient had a PCN reaction occurring within the last 10 years: No If all of the above answers are NO, then may proceed with Cephalosporin use.    Medications:  Current Outpatient Medications:    albuterol  (VENTOLIN  HFA) 108 (90 Base) MCG/ACT inhaler, Inhale 1-2 puffs into the lungs every 6 (six) hours as needed for wheezing or shortness of breath., Disp: , Rfl:    amLODipine  (NORVASC ) 10 MG tablet, Take 1 tablet (10 mg total) by mouth daily., Disp: 90 tablet, Rfl: 1   busPIRone (BUSPAR) 5 MG tablet, Take 5 mg by mouth 3 (three) times daily. (Patient taking differently: Take 5 mg by mouth 3 (three) times daily as needed.), Disp: , Rfl:    Cholecalciferol 50 MCG (2000 UT) CAPS, Take 1 capsule by mouth daily., Disp: , Rfl:    Ferrous Sulfate  (IRON) 325 (65 Fe) MG TABS, Take 1 tablet by mouth daily after lunch., Disp: , Rfl:    Testosterone  200 MG PLLT, Take by implantation route., Disp: ,  Rfl:    tirzepatide  (ZEPBOUND ) 5 MG/0.5ML Pen, Inject 5 mg into the skin once a week., Disp: 2 mL, Rfl: 1   triamcinolone  cream (KENALOG ) 0.5 %, Apply 1 Application topically 2 (two) times daily as needed., Disp: , Rfl:   Observations/Objective: Patient is well-developed, well-nourished in no acute distress.  Resting comfortably  at home.  Head is normocephalic, atraumatic.  No labored breathing.  Speech is clear and  coherent with logical content.  Patient is alert and oriented at baseline.   Assessment and Plan: 1. Obesity (BMI 30.0-34.9) (Primary)  Improving Will increase her Zepbound  to 5 mg weekly Recommend close follow up with us  if rash returns/worsens -- otherwise continue medication as tolerated and continue efforts at healthy lifestyle  Follow Up Instructions: I discussed the assessment and treatment plan with the patient. The patient was provided an opportunity to ask questions and all were answered. The patient agreed with the plan and demonstrated an understanding of the instructions.  A copy of instructions were sent to the patient via MyChart unless otherwise noted below.   The patient was advised to call back or seek an in-person evaluation if the symptoms worsen or if the condition fails to improve as anticipated.  Lucie Buttner, [place]

## 2023-12-06 DIAGNOSIS — F419 Anxiety disorder, unspecified: Secondary | ICD-10-CM | POA: Diagnosis not present

## 2023-12-06 DIAGNOSIS — F909 Attention-deficit hyperactivity disorder, unspecified type: Secondary | ICD-10-CM | POA: Diagnosis not present

## 2023-12-06 DIAGNOSIS — F329 Major depressive disorder, single episode, unspecified: Secondary | ICD-10-CM | POA: Diagnosis not present

## 2023-12-15 ENCOUNTER — Other Ambulatory Visit: Payer: Self-pay | Admitting: Physician Assistant

## 2023-12-30 ENCOUNTER — Encounter: Payer: Self-pay | Admitting: Physician Assistant

## 2024-01-03 MED ORDER — ZEPBOUND 7.5 MG/0.5ML ~~LOC~~ SOAJ: 7.5000 mg | SUBCUTANEOUS | 0 refills | Status: DC

## 2024-01-17 ENCOUNTER — Encounter: Payer: Self-pay | Admitting: Physician Assistant

## 2024-01-17 ENCOUNTER — Ambulatory Visit (INDEPENDENT_AMBULATORY_CARE_PROVIDER_SITE_OTHER): Admitting: Physician Assistant

## 2024-01-17 VITALS — BP 130/80 | HR 85 | Temp 98.1°F | Ht 69.0 in | Wt 210.2 lb

## 2024-01-17 DIAGNOSIS — H5789 Other specified disorders of eye and adnexa: Secondary | ICD-10-CM

## 2024-01-17 MED ORDER — OFLOXACIN 0.3 % OP SOLN: 1.0000 [drp] | Freq: Four times a day (QID) | OPHTHALMIC | 0 refills | Status: DC

## 2024-01-17 NOTE — Progress Notes (Signed)
 [name] is a 38 y.o. female here for a follow up of a pre-existing problem.  History of Present Illness:   Chief Complaint  Patient presents with   Eye Problem    Pt c/o left eye very swollen and painful, watery, started this morning.   Discussed the use of AI scribe software for clinical note transcription with the patient, who gave verbal consent to proceed.  History of Present Illness [name] is a 38 year old female who presents with a swollen and tearing right eye.  This morning, her right eye became swollen and tearing. She suspects her new cat, present for about a month, may be a factor, as it was on her bed yesterday. She has not used any allergy medication recently. She typically wears contacts and removed them this morning but cannot find her glasses, necessitating a neighbor to drive her. There is no change in vision with contacts, but refocusing causes pain and tearing. Pain is localized behind and around the eye, not the eyelid. No ear pain, sore throat, fever, chills, or body aches are present. Swelling temporarily improves but recurs. She has astigmatism in the affected eye and a history of optic nerve concerns. Her last eye exam was within the past one to two years.    Past Medical History:  Diagnosis Date   ADHD (attention deficit hyperactivity disorder)    Allergy    Anxiety    Asthma    Atypical chest pain    ED visit in epic 07-17-2022;  referred and evaluated by cardiologist-- dr m. branch , note in epic 08-26-2022   Complication of anesthesia    trouble waking up   Depression    History of gestational diabetes    History of pregnancy induced hypertension    HSV (herpes simplex virus) anogenital infection    Hypertension    Menorrhagia    Sleep apnea    Wears contact lenses      Social History   Tobacco Use   Smoking status: Never   Smokeless tobacco: Never  Vaping Use   Vaping status: Every Day   Substances: Nicotine, Flavoring    Devices: geek  Substance Use Topics   Alcohol  use: Yes    Comment: socially   Drug use: Never    Past Surgical History:  Procedure Laterality Date   CESAREAN SECTION N/A 05/06/2018   Procedure: CESAREAN SECTION;  Surgeon: Rosalva Sawyer, MD;  Location: [facility] SU[facility];  Service: Obstetrics;  Laterality: N/A;   DIAGNOSTIC LAPAROSCOPY  05/03/2007   @[facility]  ;  for chronic pelvic pain   ENDOMETRIAL ABLATION N/A 09/18/2022   Procedure: ENDOMETRIAL ABLATION WITH NOVASURE;  Surgeon: Rutherford Gain, MD;  Location: [facility];  Service: Gynecology;  Laterality: N/A;   LUMBAR FUSION  2017   in [place] ;  L2--L5   TUBAL LIGATION N/A 09/18/2022   Procedure: LAPARASCOPIC BILATERAL TUBAL LIGATION;  Surgeon: Rutherford Gain, MD;  Location: [facility];  Service: Gynecology;  Laterality: N/A;    Family History  Problem Relation Age of Onset   Hypertension Mother    Asthma Mother    Thyroid  disease Mother    Asthma Father    Hypertension Father    Heart disease Father    Hypertension Sister    Heart attack Maternal Grandmother    Heart attack Paternal Grandmother    Diabetes Paternal Grandmother    Stroke Paternal Grandmother    Kidney disease Paternal Grandmother  Thyroid  disease Paternal Grandmother    Heart attack Paternal Grandfather    COPD Paternal Grandfather    Anesthesia problems Neg Hx    Hypotension Neg Hx    Malignant hyperthermia Neg Hx    Pseudochol deficiency Neg Hx     Allergies  Allergen Reactions   Latex Itching   Morphine  And Codeine Itching    Lower doses OK   Doxycycline Rash   Penicillins Rash and Other (See Comments)    Childhood reaction ( unknown) Has patient had a PCN reaction causing immediate rash, facial/tongue/throat swelling, SOB or lightheadedness with hypotension: Yes Has patient had a PCN reaction causing severe rash involving mucus membranes or skin necrosis: No Has patient had a PCN reaction that required  hospitalization: No Has patient had a PCN reaction occurring within the last 10 years: No If all of the above answers are NO, then may proceed with Cephalosporin use.     Current Medications:   Current Outpatient Medications:    albuterol  (VENTOLIN  HFA) 108 (90 Base) MCG/ACT inhaler, Inhale 1-2 puffs into the lungs every 6 (six) hours as needed for wheezing or shortness of breath., Disp: , Rfl:    amLODipine  (NORVASC ) 10 MG tablet, TAKE 1 TABLET BY MOUTH EVERY DAY, Disp: 90 tablet, Rfl: 1   busPIRone (BUSPAR) 5 MG tablet, Take 5 mg by mouth 3 (three) times daily. (Patient taking differently: Take 5 mg by mouth 3 (three) times daily as needed.), Disp: , Rfl:    Cholecalciferol 50 MCG (2000 UT) CAPS, Take 1 capsule by mouth daily., Disp: , Rfl:    Ferrous Sulfate  (IRON) 325 (65 Fe) MG TABS, Take 1 tablet by mouth daily after lunch., Disp: , Rfl:    Testosterone  200 MG PLLT, Take by implantation route., Disp: , Rfl:    tirzepatide  (ZEPBOUND ) 7.5 MG/0.5ML Pen, Inject 7.5 mg into the skin once a week., Disp: 2 mL, Rfl: 0   triamcinolone  cream (KENALOG ) 0.5 %, Apply 1 Application topically 2 (two) times daily as needed., Disp: , Rfl:    Review of Systems:   Negative unless otherwise specified per HPI.  Vitals:   Vitals:   01/17/24 1317  BP: 130/80  Pulse: 85  Temp: 98.1 F (36.7 C)  TempSrc: Temporal  SpO2: 98%  Weight: 210 lb 4 oz (95.4 kg)  Height: 5' 9 (1.753 m)     Body mass index is 31.05 kg/m.  Physical Exam:   Physical Exam Vitals and nursing note reviewed.  Constitutional:      General: She is not in acute distress.    Appearance: She is well-developed. She is not ill-appearing or toxic-appearing.  HENT:     Head: Normocephalic and atraumatic.     Right Ear: Tympanic membrane, ear canal and external ear normal. Tympanic membrane is not erythematous, retracted or bulging.     Left Ear: Tympanic membrane, ear canal and external ear normal. Tympanic membrane is not  erythematous, retracted or bulging.     Nose: Nose normal.     Right Sinus: No maxillary sinus tenderness or frontal sinus tenderness.     Left Sinus: No maxillary sinus tenderness or frontal sinus tenderness.     Mouth/Throat:     Pharynx: Uvula midline. No posterior oropharyngeal erythema.  Eyes:     General: Lids are normal.        Left eye: Discharge present.    Extraocular Movements: Extraocular movements intact.     Conjunctiva/sclera:     Left eye:  Left conjunctiva is injected.  Neck:     Trachea: Trachea normal.  Cardiovascular:     Rate and Rhythm: Normal rate and regular rhythm.     Heart sounds: Normal heart sounds, S1 normal and S2 normal.  Pulmonary:     Effort: Pulmonary effort is normal.     Breath sounds: Normal breath sounds. No decreased breath sounds, wheezing, rhonchi or rales.  Lymphadenopathy:     Cervical: No cervical adenopathy.  Skin:    General: Skin is warm and dry.  Neurological:     Mental Status: She is alert.  Psychiatric:        Speech: Speech normal.        Behavior: Behavior normal. Behavior is cooperative.     Assessment and Plan:   Assessment and Plan Assessment & Plan Eye irritation Intermittent right eye swelling and pain with clear tearing, no purulent discharge. Differential includes viral conjunctivitis or contact lens irritation. No significant vision changes. - Prescribed ofloxacin  eye drops - Advised to avoid contact lenses for 24 hours and use glasses. - Instructed to monitor for worsening symptoms and contact eye doctor or visit ER if needed. - Recommended changing contact lenses more frequently. - Discussed potential risk of NAION and GLP-1 -- she will monitor her symptom(s) and follow up with her eye doctor if any urgent changes to symptom(s)      Lucie Buttner, PA-C

## 2024-01-17 NOTE — Patient Instructions (Signed)
 Need

## 2024-01-27 ENCOUNTER — Other Ambulatory Visit (HOSPITAL_COMMUNITY): Payer: Self-pay

## 2024-01-27 ENCOUNTER — Telehealth: Payer: Self-pay | Admitting: Pharmacy Technician

## 2024-01-27 NOTE — Telephone Encounter (Signed)
 Pharmacy Patient Advocate Encounter   Received notification from CoverMyMeds that prior authorization for Zepbound  2.5MG /0.5ML pen-injectors is due for renewal.   Insurance verification completed.   The patient is insured through [employer]  .  Action: Medication has been discontinued. Archived Key: [id number]  **Patient is on higher dose now.**

## 2024-01-31 ENCOUNTER — Ambulatory Visit (INDEPENDENT_AMBULATORY_CARE_PROVIDER_SITE_OTHER): Admitting: Physician Assistant

## 2024-01-31 ENCOUNTER — Encounter: Payer: Self-pay | Admitting: Physician Assistant

## 2024-01-31 VITALS — BP 110/80 | HR 90 | Temp 97.7°F | Ht 69.0 in | Wt 202.5 lb

## 2024-01-31 DIAGNOSIS — D509 Iron deficiency anemia, unspecified: Secondary | ICD-10-CM | POA: Diagnosis not present

## 2024-01-31 DIAGNOSIS — Z0000 Encounter for general adult medical examination without abnormal findings: Secondary | ICD-10-CM | POA: Diagnosis not present

## 2024-01-31 DIAGNOSIS — E559 Vitamin D deficiency, unspecified: Secondary | ICD-10-CM | POA: Diagnosis not present

## 2024-01-31 DIAGNOSIS — Z8632 Personal history of gestational diabetes: Secondary | ICD-10-CM | POA: Diagnosis not present

## 2024-01-31 DIAGNOSIS — Z13220 Encounter for screening for lipoid disorders: Secondary | ICD-10-CM

## 2024-01-31 DIAGNOSIS — T50905A Adverse effect of unspecified drugs, medicaments and biological substances, initial encounter: Secondary | ICD-10-CM

## 2024-01-31 DIAGNOSIS — E663 Overweight: Secondary | ICD-10-CM | POA: Diagnosis not present

## 2024-01-31 DIAGNOSIS — R768 Other specified abnormal immunological findings in serum: Secondary | ICD-10-CM | POA: Diagnosis not present

## 2024-01-31 DIAGNOSIS — I10 Essential (primary) hypertension: Secondary | ICD-10-CM

## 2024-01-31 LAB — IBC + FERRITIN
Ferritin: 95 ng/mL (ref 10.0–291.0)
Iron: 79 ug/dL (ref 42–145)
Saturation Ratios: 25.5 % (ref 20.0–50.0)
TIBC: 309.4 ug/dL (ref 250.0–450.0)
Transferrin: 221 mg/dL (ref 212.0–360.0)

## 2024-01-31 LAB — LIPID PANEL
Cholesterol: 260 mg/dL — ABNORMAL HIGH (ref 0–200)
HDL: 33.8 mg/dL — ABNORMAL LOW (ref >39.00)
LDL Cholesterol: 195 mg/dL — ABNORMAL HIGH (ref 0–99)
NonHDL: 226.14
Total CHOL/HDL Ratio: 8
Triglycerides: 154 mg/dL — ABNORMAL HIGH (ref 0.0–149.0)
VLDL: 30.8 mg/dL (ref 0.0–40.0)

## 2024-01-31 LAB — HEMOGLOBIN A1C: Hgb A1c MFr Bld: 5.4 % (ref 4.6–6.5)

## 2024-01-31 LAB — VITAMIN D 25 HYDROXY (VIT D DEFICIENCY, FRACTURES): VITD: 48.55 ng/mL (ref 30.00–100.00)

## 2024-01-31 MED ORDER — ZEPBOUND 7.5 MG/0.5ML ~~LOC~~ SOAJ: 7.5000 mg | SUBCUTANEOUS | 0 refills | Status: DC

## 2024-01-31 NOTE — Progress Notes (Signed)
 Subjective:    [name] is a 38 y.o. female and is here for a comprehensive physical exam.  HPI  There are no preventive care reminders to display for this patient.  Discussed the use of AI scribe software for clinical note transcription with the patient, who gave verbal consent to proceed.  History of Present Illness [name] is a 38 year old female who presents with an allergic reaction at the injection site of her medication. She is taking Zepbound  7.5 mg weekly in her abdomen. She is eating a large variety of foods and getting regular exercise daily.  Wt Readings from Last 3 Encounters:  01/31/24 202 lb 8 oz (91.9 kg)  01/17/24 210 lb 4 oz (95.4 kg)  11/29/23 219 lb (99.3 kg)   She has lost 17 pounds in the last two months due to Zepbound  use.  The injection site reaction is raw, itchy, and painful, with no relief from triamcinolone  cream. The reaction has worsened since the last injection on Thursday. She has not tried alternative injection sites.  Her medical history includes gestational diabetes. She is not taking amlodipine  due to stable blood pressure and is on Buspar for psychiatric care.  Family history includes thyroid  disease and benign breast cysts. She does not smoke, has no recent infections, and uses an albuterol  inhaler as needed. She is actively losing weight and abstains from alcohol .  Health Maintenance: Immunizations -- UpToDate  Colonoscopy -- n/a Mammogram -- N/A  PAP -- UpToDate  Bone Density -- n/a Diet -- healthy Exercise -- regular exercise  Sleep habits -- no major concerns Mood -- managed by psychiatry and psychology  UTD with dentist? - yes UTD with eye doctor? - yes  Weight history: Wt Readings from Last 10 Encounters:  01/31/24 202 lb 8 oz (91.9 kg)  01/17/24 210 lb 4 oz (95.4 kg)  11/29/23 219 lb (99.3 kg)  11/11/23 225 lb (102.1 kg)  09/18/23 223 lb (101.2 kg)  07/13/23 225 lb (102.1 kg)  07/12/23 224 lb 6.1 oz  (101.8 kg)  01/06/23 212 lb 12.8 oz (96.5 kg)  12/28/22 212 lb 8 oz (96.4 kg)  12/04/22 213 lb (96.6 kg)   Body mass index is 29.9 kg/m. No LMP recorded. Patient has had an ablation.  Alcohol  use:  reports current alcohol  use.  Tobacco use:  Tobacco Use: Low Risk  (01/31/2024)   Patient History    Smoking Tobacco Use: Never    Smokeless Tobacco Use: Never    Passive Exposure: Not on file   Eligible for lung cancer screening? no     11/11/2023   11:57 AM  Depression screen PHQ 2/9  Decreased Interest 2  Down, Depressed, Hopeless 1  PHQ - 2 Score 3  Altered sleeping 3  Tired, decreased energy 3  Change in appetite 2  Feeling bad or failure about yourself  0  Trouble concentrating 2  Moving slowly or fidgety/restless 0  Suicidal thoughts 0  PHQ-9 Score 13     Other providers/specialists: Patient Care Team: Job Lukes, [place] as PCP - General (Physician Assistant) Rutherford Gain, MD as Consulting Physician (Obstetrics and Gynecology)    PMHx, SurgHx, SocialHx, Medications, and Allergies were reviewed in the Visit Navigator and updated as appropriate.   Past Medical History:  Diagnosis Date   ADHD (attention deficit hyperactivity disorder)    Allergy    Anxiety    Asthma    Atypical chest pain    ED visit in  epic 07-17-2022;  referred and evaluated by cardiologist-- dr m. branch , note in epic 08-26-2022   Complication of anesthesia    trouble waking up   Depression    History of gestational diabetes    History of pregnancy induced hypertension    HSV (herpes simplex virus) anogenital infection    Hypertension    Menorrhagia    Sleep apnea    Wears contact lenses      Past Surgical History:  Procedure Laterality Date   CESAREAN SECTION N/A 05/06/2018   Procedure: CESAREAN SECTION;  Surgeon: Rosalva Sawyer, MD;  Location: [facility] BIRTHING SUITES;  Service: Obstetrics;  Laterality: N/A;   DIAGNOSTIC LAPAROSCOPY  05/03/2007   @[facility]  ;  for chronic pelvic pain    ENDOMETRIAL ABLATION N/A 09/18/2022   Procedure: ENDOMETRIAL ABLATION WITH NOVASURE;  Surgeon: Rutherford Gain, MD;  Location: [facility];  Service: Gynecology;  Laterality: N/A;   LUMBAR FUSION  2017   in [place] ;  L2--L5   TUBAL LIGATION N/A 09/18/2022   Procedure: LAPARASCOPIC BILATERAL TUBAL LIGATION;  Surgeon: Rutherford Gain, MD;  Location: [facility];  Service: Gynecology;  Laterality: N/A;     Family History  Problem Relation Age of Onset   Hypertension Mother    Asthma Mother    Thyroid  disease Mother    Asthma Father    Hypertension Father    Heart disease Father    Hypertension Sister    Heart attack Maternal Grandmother    Heart attack Paternal Grandmother    Diabetes Paternal Grandmother    Stroke Paternal Grandmother    Kidney disease Paternal Grandmother    Thyroid  disease Paternal Grandmother    Heart attack Paternal Grandfather    COPD Paternal Grandfather    Anesthesia problems Neg Hx    Hypotension Neg Hx    Malignant hyperthermia Neg Hx    Pseudochol deficiency Neg Hx     Social History   Tobacco Use   Smoking status: Never   Smokeless tobacco: Never  Vaping Use   Vaping status: Every Day   Substances: Nicotine, Flavoring   Devices: geek  Substance Use Topics   Alcohol  use: Yes    Comment: socially   Drug use: Never    Review of Systems:   Review of Systems  Constitutional:  Negative for chills, fever, malaise/fatigue and weight loss.  HENT:  Negative for hearing loss, sinus pain and sore throat.   Respiratory:  Negative for cough and hemoptysis.   Cardiovascular:  Negative for chest pain, palpitations, leg swelling and PND.  Gastrointestinal:  Negative for abdominal pain, constipation, diarrhea, heartburn, nausea and vomiting.  Genitourinary:  Negative for dysuria, frequency and urgency.  Musculoskeletal:  Negative for back pain, myalgias and neck pain.  Skin:  Negative for itching and rash.   Neurological:  Negative for dizziness, tingling, seizures and headaches.  Endo/Heme/Allergies:  Negative for polydipsia.  Psychiatric/Behavioral:  Negative for depression. The patient is not nervous/anxious.     Objective:   BP 110/80 (BP Location: Left Arm, Patient Position: Sitting, Cuff Size: Large)   Pulse 90   Temp 97.7 F (36.5 C) (Temporal)   Ht 5' 9 (1.753 m)   Wt 202 lb 8 oz (91.9 kg)   BMI 29.90 kg/m  Body mass index is 29.9 kg/m.   General Appearance:    Alert, cooperative, no distress, appears stated age  Head:    Normocephalic, without obvious abnormality, atraumatic  Eyes:    PERRL, conjunctiva/corneas  clear, EOM's intact, fundi    benign, both eyes  Ears:    Normal TM's and external ear canals, both ears  Nose:   Nares normal, septum midline, mucosa normal, no drainage    or sinus tenderness  Throat:   Lips, mucosa, and tongue normal; teeth and gums normal  Neck:   Supple, symmetrical, trachea midline, no adenopathy;    thyroid :  no enlargement/tenderness/nodules; no carotid   bruit or JVD  Back:     Symmetric, no curvature, ROM normal, no CVA tenderness  Lungs:     Clear to auscultation bilaterally, respirations unlabored  Chest Wall:    No tenderness or deformity   Heart:    Regular rate and rhythm, S1 and S2 normal, no murmur, rub or gallop  Breast Exam:    Deferred  Abdomen:     Soft, non-tender, bowel sounds active all four quadrants,    no masses, no organomegaly Right lower abdomen with area of erythema; no tenderness to palpation or warmth  Genitalia:    Deferred   Extremities:   Extremities normal, atraumatic, no cyanosis or edema  Pulses:   2+ and symmetric all extremities  Skin:   Skin color, texture, turgor normal, no rashes or lesions  Lymph nodes:   Cervical, supraclavicular, and axillary nodes normal  Neurologic:   CNII-XII intact, normal strength, sensation and reflexes    throughout    Assessment/Plan:   Assessment and  Plan Assessment & Plan Comprehensive Physical Exam (CPE) preventive care annual visit Today patient counseled on age appropriate routine health concerns for screening and prevention, each reviewed and up to date or declined. Immunizations reviewed and up to date or declined. Labs ordered and reviewed. Risk factors for depression reviewed and negative. Hearing function and visual acuity are intact. ADLs screened and addressed as needed. Functional ability and level of safety reviewed and appropriate. Education, counseling and referrals performed based on assessed risks today. Patient provided with a copy of personalized plan for preventive services.  Medication reaction Worsening irritation and itching at tirzepatide  injection site, possibly due to friction. Possible allergic reaction. - Advise next tirzepatide  injection in thigh or back of arm with assistance. - Consider switching to oral formulation if irritation persists. - Suggest using ice for itching relief. - Discuss potential use of Benadryl  cream for itching.  Overweight (Initial weight 224 pounds) Weighs 200 lbs, goal 180 lbs. Progressing with weight loss. Not taking amlodipine  due to well-managed blood pressure. Discussed surgical options for excess skin removal post-weight loss. - Encourage continued weight loss efforts. - Monitor blood pressure regularly. - Discuss potential surgical options for excess skin removal post-weight loss, including insurance coverage for tummy tuck if medically indicated. - She has lost 17 pounds in two months with medication, diet and exercise.  Hypertension Blood pressure well-managed with weight loss, not taking amlodipine . Monitors blood pressure at home. Continue to monitor blood pressure regularly  Positive ANA She would like this rechecked today  Vitamin D  deficiency Update and provide recommendations  Iron deficiency anemia She is taking iron regularly Update iron panel and provide  recommendations on further/continued supplementation  History of gestational diabetes mellitus Update Hemoglobin A1c and provide recommendations        Lucie Buttner, PA-C [facility]

## 2024-01-31 NOTE — Patient Instructions (Signed)
 Wt Readings from Last 3 Encounters:  01/31/24 202 lb 8 oz (91.9 kg)  01/17/24 210 lb 4 oz (95.4 kg)  11/29/23 219 lb (99.3 kg)

## 2024-02-01 ENCOUNTER — Ambulatory Visit: Payer: Self-pay | Admitting: Physician Assistant

## 2024-02-02 LAB — ANTI-NUCLEAR AB-TITER (ANA TITER): ANA Titer 1: 1:80 {titer} — ABNORMAL HIGH

## 2024-02-02 LAB — ANA: Anti Nuclear Antibody (ANA): POSITIVE — AB

## 2024-02-04 ENCOUNTER — Encounter: Admitting: Physician Assistant

## 2024-02-08 ENCOUNTER — Telehealth: Payer: Self-pay | Admitting: *Deleted

## 2024-02-08 ENCOUNTER — Other Ambulatory Visit (HOSPITAL_COMMUNITY): Payer: Self-pay

## 2024-02-08 ENCOUNTER — Other Ambulatory Visit: Payer: Self-pay | Admitting: *Deleted

## 2024-02-08 NOTE — Telephone Encounter (Signed)
 error

## 2024-02-08 NOTE — Progress Notes (Signed)
 Error

## 2024-02-08 NOTE — Telephone Encounter (Signed)
 PA for Zepbound  expired 02/07/2024. Pt currently taking Zepbound  7.5 mg. Please do new prior authorization.

## 2024-02-09 ENCOUNTER — Other Ambulatory Visit (HOSPITAL_COMMUNITY): Payer: Self-pay

## 2024-02-09 ENCOUNTER — Telehealth: Payer: Self-pay

## 2024-02-09 NOTE — Telephone Encounter (Signed)
 Noted

## 2024-02-09 NOTE — Telephone Encounter (Signed)
 Pharmacy Patient Advocate Encounter  Received notification from [facility] [employer] that Prior Authorization for Zepbound  7.5MG /0.5ML pen-injectors  has been APPROVED from 02/09/24 to 02/08/25   PA #/Case ID/Reference #: [id number]

## 2024-02-09 NOTE — Telephone Encounter (Signed)
 Pharmacy Patient Advocate Encounter   Received notification from Pt Calls Messages that prior authorization for Zepbound  7.5MG /0.5ML pen-injectors is required/requested.   Insurance verification completed.   The patient is insured through [employer].   Per test claim: PA required; PA submitted to above mentioned insurance via Latent Key/confirmation #/EOC [facility] Status is pending

## 2024-02-10 NOTE — Telephone Encounter (Signed)
 Patient notified Rx  Zepbound  7.5MG /0.5ML pen-injectors  has been APPROVED from 02/09/24 to 02/08/25

## 2024-02-28 DIAGNOSIS — F419 Anxiety disorder, unspecified: Secondary | ICD-10-CM | POA: Diagnosis not present

## 2024-02-28 DIAGNOSIS — F909 Attention-deficit hyperactivity disorder, unspecified type: Secondary | ICD-10-CM | POA: Diagnosis not present

## 2024-02-28 DIAGNOSIS — F329 Major depressive disorder, single episode, unspecified: Secondary | ICD-10-CM | POA: Diagnosis not present

## 2024-02-29 ENCOUNTER — Encounter: Admitting: Physician Assistant

## 2024-03-03 ENCOUNTER — Encounter: Payer: Self-pay | Admitting: Physician Assistant

## 2024-03-06 MED ORDER — ZEPBOUND 10 MG/0.5ML ~~LOC~~ SOAJ: 10.0000 mg | SUBCUTANEOUS | 0 refills | Status: DC

## 2024-03-07 ENCOUNTER — Ambulatory Visit: Payer: Self-pay

## 2024-03-07 ENCOUNTER — Emergency Department (HOSPITAL_BASED_OUTPATIENT_CLINIC_OR_DEPARTMENT_OTHER)

## 2024-03-07 ENCOUNTER — Encounter (HOSPITAL_BASED_OUTPATIENT_CLINIC_OR_DEPARTMENT_OTHER): Payer: Self-pay | Admitting: Emergency Medicine

## 2024-03-07 ENCOUNTER — Emergency Department (HOSPITAL_BASED_OUTPATIENT_CLINIC_OR_DEPARTMENT_OTHER)
Admission: EM | Admit: 2024-03-07 | Discharge: 2024-03-07 | Disposition: A | Discharge status: Home / Self Care | Attending: Emergency Medicine | Admitting: Emergency Medicine

## 2024-03-07 ENCOUNTER — Other Ambulatory Visit: Payer: Self-pay

## 2024-03-07 DIAGNOSIS — Z79899 Other long term (current) drug therapy: Secondary | ICD-10-CM | POA: Diagnosis not present

## 2024-03-07 DIAGNOSIS — R1031 Right lower quadrant pain: Secondary | ICD-10-CM | POA: Diagnosis not present

## 2024-03-07 DIAGNOSIS — I10 Essential (primary) hypertension: Secondary | ICD-10-CM | POA: Insufficient documentation

## 2024-03-07 DIAGNOSIS — R109 Unspecified abdominal pain: Secondary | ICD-10-CM | POA: Diagnosis not present

## 2024-03-07 DIAGNOSIS — K8020 Calculus of gallbladder without cholecystitis without obstruction: Secondary | ICD-10-CM | POA: Diagnosis not present

## 2024-03-07 DIAGNOSIS — R1012 Left upper quadrant pain: Secondary | ICD-10-CM | POA: Insufficient documentation

## 2024-03-07 DIAGNOSIS — R1032 Left lower quadrant pain: Secondary | ICD-10-CM | POA: Insufficient documentation

## 2024-03-07 DIAGNOSIS — Z91040 Latex allergy status: Secondary | ICD-10-CM | POA: Insufficient documentation

## 2024-03-07 LAB — URINALYSIS, ROUTINE W REFLEX MICROSCOPIC
Bilirubin Urine: NEGATIVE
Glucose, UA: NEGATIVE mg/dL
Hgb urine dipstick: NEGATIVE
Ketones, ur: NEGATIVE mg/dL
Nitrite: NEGATIVE
Specific Gravity, Urine: 1.027 (ref 1.005–1.030)
pH: 6.5 (ref 5.0–8.0)

## 2024-03-07 LAB — COMPREHENSIVE METABOLIC PANEL WITH GFR
ALT: 17 U/L (ref 0–44)
AST: 17 U/L (ref 15–41)
Albumin: 4.4 g/dL (ref 3.5–5.0)
Alkaline Phosphatase: 74 U/L (ref 38–126)
Anion gap: 10 (ref 5–15)
BUN: 11 mg/dL (ref 6–20)
CO2: 25 mmol/L (ref 22–32)
Calcium: 9.5 mg/dL (ref 8.9–10.3)
Chloride: 102 mmol/L (ref 98–111)
Creatinine, Ser: 1.13 mg/dL — ABNORMAL HIGH (ref 0.44–1.00)
GFR, Estimated: >60 mL/min (ref >60)
Glucose, Bld: 100 mg/dL — ABNORMAL HIGH (ref 70–99)
Potassium: 3.6 mmol/L (ref 3.5–5.1)
Sodium: 137 mmol/L (ref 135–145)
Total Bilirubin: 0.8 mg/dL (ref 0.0–1.2)
Total Protein: 7.2 g/dL (ref 6.5–8.1)

## 2024-03-07 LAB — CBC
HCT: 43.5 % (ref 36.0–46.0)
Hemoglobin: 15.2 g/dL — ABNORMAL HIGH (ref 12.0–15.0)
MCH: 29.6 pg (ref 26.0–34.0)
MCHC: 34.9 g/dL (ref 30.0–36.0)
MCV: 84.8 fL (ref 80.0–100.0)
Platelets: 345 K/uL (ref 150–400)
RBC: 5.13 MIL/uL — ABNORMAL HIGH (ref 3.87–5.11)
RDW: 12.5 % (ref 11.5–15.5)
WBC: 6.9 K/uL (ref 4.0–10.5)
nRBC: 0 % (ref 0.0–0.2)

## 2024-03-07 LAB — PREGNANCY, URINE: Preg Test, Ur: NEGATIVE

## 2024-03-07 LAB — LIPASE, BLOOD: Lipase: 73 U/L — ABNORMAL HIGH (ref 11–51)

## 2024-03-07 MED ORDER — SODIUM CHLORIDE 0.9 % IV BOLUS
1000.0000 mL | Freq: Once | INTRAVENOUS | Status: AC
  Administered 2024-03-07: 1000 mL via INTRAVENOUS

## 2024-03-07 MED ORDER — KETOROLAC TROMETHAMINE 30 MG/ML IJ SOLN
30.0000 mg | Freq: Once | INTRAMUSCULAR | Status: AC
  Administered 2024-03-07: 30 mg via INTRAVENOUS
  Filled 2024-03-07: qty 1

## 2024-03-07 MED ORDER — IOHEXOL 300 MG/ML  SOLN
100.0000 mL | Freq: Once | INTRAMUSCULAR | Status: AC | PRN
  Administered 2024-03-07: 100 mL via INTRAVENOUS

## 2024-03-07 MED ORDER — HYDROCODONE-ACETAMINOPHEN 5-325 MG PO TABS: 1.0000 | ORAL_TABLET | Freq: Four times a day (QID) | ORAL | 0 refills | Status: DC | PRN

## 2024-03-07 NOTE — Telephone Encounter (Signed)
 Please see message and advise if patient should stop Zepbound .

## 2024-03-07 NOTE — Telephone Encounter (Signed)
 FYI Only or Action Required?: Action required by provider: Request for pain medication.  Patient was last seen in primary care on 01/31/2024 by Job Lukes, PA.  Called Nurse Triage reporting Abdominal Pain.  Symptoms began several weeks ago.  Interventions attempted: Nothing.  Symptoms are: gradually worsening.  Triage Disposition: See HCP Within 4 Hours (Or PCP Triage)  Patient/caregiver understands and will follow disposition?: Yes      Copied from CRM [id number]. Topic: Clinical - Red Word Triage >> Mar 07, 2024  9:42 AM Eva FALCON wrote: Red Word that prompted transfer to Nurse Triage: Pt calling in to schedule hospital follow up, she wanted to be seen today but nothing available , scheduled for tomorrow. She is still experiencing pain on left side of stomach. States she was told at ED possible pancreatitis. Reason for Disposition  [1] MILD-MODERATE pain AND [2] constant AND [3] present > 2 hours  Answer Assessment - Initial Assessment Questions Pt seen in ED today for symptoms and called to schedule a hospital follow up. Patient states she is not taking the Norco medication that was prescribed for pain in the ED. Requesting for her PCP to send something in for pain until her appointment tomorrow. Preferred pharmacy below.    [id number] - [place], [place] - [address]  [address], [place] [address]    1. LOCATION: Where does it hurt?      L side of abdominal area.  2. RADIATION: Does the pain shoot anywhere else? (e.g., chest, back)     L leg  3. ONSET: When did the pain begin? (e.g., minutes, hours or days ago)      2 weeks ago  4. PATTERN Does the pain come and go, or is it constant?     Constant  5. SEVERITY: How bad is the pain?  (e.g., Scale 1-10; mild, moderate, or severe)     7/10 6. RECURRENT SYMPTOM: Have you ever had this type of stomach pain before? If Yes, ask: When was the last time? and What happened that time?      No   7. CAUSE: What do you think is causing the stomach pain? (e.g., gallstones, recent abdominal surgery)     Possible pancreatitis  8. OTHER SYMPTOMS: Do you have any other symptoms? (e.g., back pain, diarrhea, fever, urination pain, vomiting)       Denies  Protocols used: Abdominal Pain - Female-A-AH

## 2024-03-07 NOTE — Telephone Encounter (Signed)
 Spoke to pt told her Lucie said she needs to take the pain medication that was prescribed by the ER. If pain is still uncontrolled, needs to return to the ER. Also need to stop Zepbound . Pt verbalized understanding and said she will not take pain medication from ER does not make her feel good and she did not pick it up. Asked pt if there is a reason she did not schedule today? Pt said she was told no openings. Asked her if she would like to be seen today? Pt said yes. Told her I will send message to our schedulers and they will contact you to be scheduled with someone today. Pt verbalized understanding.

## 2024-03-07 NOTE — ED Triage Notes (Signed)
 Pt c/o intermittent RLQ abdominal pain x 2 weeks. Denies n/v/d or urinary symptoms.

## 2024-03-07 NOTE — ED Provider Notes (Signed)
 [place] EMERGENCY DEPARTMENT AT [facility] Provider Note   CSN: [id number] Arrival date & time: 03/07/24  9951     Patient presents with: Abdominal Pain   [name] is a 38 y.o. female.   Patient is a 38 year old female with history of uterine ablation, hyperlipidemia, hypertension.  Patient presenting today with complaints of left-sided abdominal pain.  This has been ongoing for the past 2 weeks.  The pain is intermittent and not associated with any nausea or vomiting.  She describes some loose stools, but no bloody stools.  No fevers or chills.  No ill contacts.       Prior to Admission medications   Medication Sig Start Date End Date Taking? Authorizing Provider  albuterol  (VENTOLIN  HFA) 108 (90 Base) MCG/ACT inhaler Inhale 1-2 puffs into the lungs every 6 (six) hours as needed for wheezing or shortness of breath.    [provider]  amLODipine  (NORVASC ) 10 MG tablet TAKE 1 TABLET BY MOUTH EVERY DAY 12/15/23   Worley, Samantha, PA  busPIRone (BUSPAR) 5 MG tablet Take 5 mg by mouth 3 (three) times daily. Patient taking differently: Take 5 mg by mouth 3 (three) times daily as needed. 07/07/23   [provider]  Cholecalciferol 50 MCG (2000 UT) CAPS Take 1 capsule by mouth daily.    [provider]  Ferrous Sulfate  (IRON) 325 (65 Fe) MG TABS Take 1 tablet by mouth daily after lunch.    [provider]  Testosterone  200 MG PLLT Take by implantation route.    [provider]  tirzepatide  (ZEPBOUND ) 10 MG/0.5ML Pen Inject 10 mg into the skin once a week. 03/06/24   Job Lukes, PA  triamcinolone  cream (KENALOG ) 0.5 % Apply 1 Application topically 2 (two) times daily as needed. 07/13/22   [provider]    Allergies: Latex, Morphine  and codeine, Doxycycline, and Penicillins    Review of Systems  All other systems reviewed and are negative.   Updated Vital Signs BP (!) 137/96   Pulse 76   Temp 98.2 F  (36.8 C)   Resp 18   Ht 5' 9 (1.753 m)   Wt 85.3 kg   SpO2 100%   BMI 27.76 kg/m   Physical Exam Vitals and nursing note reviewed.  Constitutional:      General: She is not in acute distress.    Appearance: She is well-developed. She is not diaphoretic.  HENT:     Head: Normocephalic and atraumatic.  Cardiovascular:     Rate and Rhythm: Normal rate and regular rhythm.     Heart sounds: No murmur heard.    No friction rub. No gallop.  Pulmonary:     Effort: Pulmonary effort is normal. No respiratory distress.     Breath sounds: Normal breath sounds. No wheezing.  Abdominal:     General: Bowel sounds are normal. There is no distension.     Palpations: Abdomen is soft.     Tenderness: There is abdominal tenderness in the left upper quadrant and left lower quadrant. There is no right CVA tenderness, left CVA tenderness, guarding or rebound.  Musculoskeletal:        General: Normal range of motion.     Cervical back: Normal range of motion and neck supple.  Skin:    General: Skin is warm and dry.  Neurological:     General: No focal deficit present.     Mental Status: She is alert and oriented to person, place, and  time.     (all labs ordered are listed, but only abnormal results are displayed) Labs Reviewed  LIPASE, BLOOD - Abnormal; Notable for the following components:      Result Value   Lipase 73 (*)    All other components within normal limits  COMPREHENSIVE METABOLIC PANEL WITH GFR - Abnormal; Notable for the following components:   Glucose, Bld 100 (*)    Creatinine, Ser 1.13 (*)    All other components within normal limits  CBC - Abnormal; Notable for the following components:   RBC 5.13 (*)    Hemoglobin 15.2 (*)    All other components within normal limits  URINALYSIS, ROUTINE W REFLEX MICROSCOPIC - Abnormal; Notable for the following components:   Protein, ur TRACE (*)    Leukocytes,Ua SMALL (*)    Bacteria, UA RARE (*)    All other components within  normal limits  PREGNANCY, URINE    EKG: None  Radiology: No results found.   Procedures   Medications Ordered in the ED  sodium chloride  0.9 % bolus 1,000 mL (has no administration in time range)  ketorolac  (TORADOL ) 30 MG/ML injection 30 mg (has no administration in time range)                                    Medical Decision Making Amount and/or Complexity of Data Reviewed Labs: ordered. Radiology: ordered.  Risk Prescription drug management.   Patient is a 38 year old female presenting with a 2-week history of left-sided abdominal pain.  She arrives here with stable vital signs and is afebrile.  She does have some tenderness to the left upper and left lower quadrant, but no peritoneal signs.  Laboratory studies obtained including CBC, CMP, and lipase.  Lipase is mildly elevated at 73, but studies otherwise unremarkable.  Urinalysis inconsistent with infection and pregnancy test is negative.  CT scan of the abdomen and pelvis obtained showing no acute findings and normal appendix.  She does have cholelithiasis without superimposed PERI cholecystic change.  Patient given IV Toradol  for pain with minimal improvement.  She is driving and declines stronger pain medication.  At this point, I have found no emergent or surgical cause for her abdominal pain.  Perhaps she is developing an episode of pancreatitis.  She does take a GLP-1 inhibitor and wonders if this could be the cause.  I will have patient take clear liquids, prescribe pain medication, and have her follow-up with her prescribing physician.  To return in the meantime for any problems.     Final diagnoses:  None    ED Discharge Orders     None          Geroldine Berg, MD 03/07/24 [phone]

## 2024-03-07 NOTE — Telephone Encounter (Signed)
 Please see message and advise. Pt scheduled tomorrow.

## 2024-03-07 NOTE — Discharge Instructions (Signed)
 Begin taking hydrocodone  as prescribed as needed for pain.  Clear liquids for the next 12 hours, then slowly advance diet as tolerated.  Follow-up with your primary doctor regarding your GLP-1 inhibitor and as to whether or not this could be causing your abdominal issues.  Return to the ER if you develop worsening pain, high fevers, bloody stool or vomit, or for other new and concerning symptoms.

## 2024-03-07 NOTE — Telephone Encounter (Signed)
 Please call pt and schedule her with a provider today who has openings.

## 2024-03-08 ENCOUNTER — Encounter: Payer: Self-pay | Admitting: Physician Assistant

## 2024-03-08 ENCOUNTER — Ambulatory Visit: Admitting: Physician Assistant

## 2024-03-08 VITALS — BP 120/80 | HR 88 | Temp 97.3°F | Ht 69.0 in | Wt 194.6 lb

## 2024-03-08 DIAGNOSIS — E663 Overweight: Secondary | ICD-10-CM

## 2024-03-08 DIAGNOSIS — R1032 Left lower quadrant pain: Secondary | ICD-10-CM

## 2024-03-08 DIAGNOSIS — R82998 Other abnormal findings in urine: Secondary | ICD-10-CM

## 2024-03-08 DIAGNOSIS — E7800 Pure hypercholesterolemia, unspecified: Secondary | ICD-10-CM

## 2024-03-08 DIAGNOSIS — N760 Acute vaginitis: Secondary | ICD-10-CM | POA: Diagnosis not present

## 2024-03-08 DIAGNOSIS — K5900 Constipation, unspecified: Secondary | ICD-10-CM | POA: Diagnosis not present

## 2024-03-08 DIAGNOSIS — K8020 Calculus of gallbladder without cholecystitis without obstruction: Secondary | ICD-10-CM

## 2024-03-08 LAB — POC URINALSYSI DIPSTICK (AUTOMATED)
Bilirubin, UA: NEGATIVE
Blood, UA: NEGATIVE
Glucose, UA: NEGATIVE
Ketones, UA: NEGATIVE
Leukocytes, UA: NEGATIVE
Nitrite, UA: NEGATIVE
Protein, UA: NEGATIVE
Spec Grav, UA: 1.01 (ref 1.010–1.025)
Urobilinogen, UA: 0.2 U/dL
pH, UA: 6 (ref 5.0–8.0)

## 2024-03-08 MED ORDER — ACETAMINOPHEN-CODEINE 300-30 MG PO TABS: 1.0000 | ORAL_TABLET | ORAL | 0 refills | Status: DC | PRN

## 2024-03-08 MED ORDER — TIRZEPATIDE-WEIGHT MANAGEMENT 7.5 MG/0.5ML ~~LOC~~ SOLN: 7.5000 mg | SUBCUTANEOUS | 1 refills | Status: DC

## 2024-03-08 NOTE — Progress Notes (Signed)
 [name] is a 38 y.o. female here for a follow up of a pre-existing problem.  History of Present Illness:   Chief Complaint  Patient presents with   Hospitalization Follow-up    Due to LLQ pain on 03/07/2024 Still with pain scale #6    Discussed the use of AI scribe software for clinical note transcription with the patient, who gave verbal consent to proceed.  History of Present Illness   [name] is a 38 year old female who presents with abdominal pain radiating down her leg. She is accompanied by her five-year-old child.  She has experienced abdominal pain for two weeks, radiating down her leg, worsening and affecting her sleep. She went to the ER yesterday. Blood work showed slightly elevated creatinine and leukocytes in her urine. Mild elevation of lipase. A CT scan reveals gallstones without pancreatic abnormalities. She has no urinary symptoms like burning, frequency, or hematuria. Vaginal discharge is mild with normal color, possibly linked to Zepbound  use. She recently changed her diet.  Constipation is managed with herbal tea, started two days ago, with gradual improvement in bowel movements. She suspects hemorrhoids due to straining and blood during bowel movements.  She has a history of ovarian cysts but describes the current pain as different. The pain is constant, worsens with sitting, and radiates to the front of her thigh. She has had back surgery at [facility]-S[facility] and is concerned about a connection to her back issues. She avoids Norco due to past withdrawal symptoms and prefers alternative pain management.        Past Medical History:  Diagnosis Date   ADHD (attention deficit hyperactivity disorder)    Allergy    Anxiety    Asthma    Atypical chest pain    ED visit in epic 07-17-2022;  referred and evaluated by cardiologist-- dr m. branch , note in epic 08-26-2022   Complication of anesthesia    trouble waking up   Depression    History of gestational  diabetes    History of pregnancy induced hypertension    HSV (herpes simplex virus) anogenital infection    Hypertension    Menorrhagia    Sleep apnea    Wears contact lenses      Social History   Tobacco Use   Smoking status: Never   Smokeless tobacco: Never  Vaping Use   Vaping status: Every Day   Substances: Nicotine, Flavoring   Devices: geek  Substance Use Topics   Alcohol  use: Yes    Comment: socially   Drug use: Never    Past Surgical History:  Procedure Laterality Date   CESAREAN SECTION N/A 05/06/2018   Procedure: CESAREAN SECTION;  Surgeon: Rosalva Sawyer, MD;  Location: [facility] SU[facility];  Service: Obstetrics;  Laterality: N/A;   DIAGNOSTIC LAPAROSCOPY  05/03/2007   @[facility]  ;  for chronic pelvic pain   ENDOMETRIAL ABLATION N/A 09/18/2022   Procedure: ENDOMETRIAL ABLATION WITH NOVASURE;  Surgeon: Rutherford Gain, MD;  Location: [facility] [place];  Service: Gynecology;  Laterality: N/A;   LUMBAR FUSION  2017   in [place] ;  L2--L5   TUBAL LIGATION N/A 09/18/2022   Procedure: LAPARASCOPIC BILATERAL TUBAL LIGATION;  Surgeon: Rutherford Gain, MD;  Location: [facility] [place];  Service: Gynecology;  Laterality: N/A;    Family History  Problem Relation Age of Onset   Hypertension Mother    Asthma Mother    Thyroid  disease Mother    Asthma Father    Hypertension  Father    Heart disease Father    Hypertension Sister    Heart attack Maternal Grandmother    Heart attack Paternal Grandmother    Diabetes Paternal Grandmother    Stroke Paternal Grandmother    Kidney disease Paternal Grandmother    Thyroid  disease Paternal Grandmother    Heart attack Paternal Grandfather    COPD Paternal Grandfather    Anesthesia problems Neg Hx    Hypotension Neg Hx    Malignant hyperthermia Neg Hx    Pseudochol deficiency Neg Hx     Allergies  Allergen Reactions   Latex Itching   Morphine  And Codeine Itching    Lower doses OK   Doxycycline Rash    Penicillins Rash and Other (See Comments)    Childhood reaction ( unknown) Has patient had a PCN reaction causing immediate rash, facial/tongue/throat swelling, SOB or lightheadedness with hypotension: Yes Has patient had a PCN reaction causing severe rash involving mucus membranes or skin necrosis: No Has patient had a PCN reaction that required hospitalization: No Has patient had a PCN reaction occurring within the last 10 years: No If all of the above answers are NO, then may proceed with Cephalosporin use.     Current Medications:   Current Outpatient Medications:    acetaminophen -codeine (TYLENOL  #3) 300-30 MG tablet, Take 1-2 tablets by mouth every 4 (four) hours as needed for moderate pain (pain score 4-6)., Disp: 30 tablet, Rfl: 0   albuterol  (VENTOLIN  HFA) 108 (90 Base) MCG/ACT inhaler, Inhale 1-2 puffs into the lungs every 6 (six) hours as needed for wheezing or shortness of breath., Disp: , Rfl:    busPIRone (BUSPAR) 5 MG tablet, Take 5 mg by mouth 3 (three) times daily. (Patient taking differently: Take 5 mg by mouth 3 (three) times daily as needed.), Disp: , Rfl:    Cholecalciferol 50 MCG (2000 UT) CAPS, Take 1 capsule by mouth daily., Disp: , Rfl:    Ferrous Sulfate  (IRON) 325 (65 Fe) MG TABS, Take 1 tablet by mouth daily after lunch., Disp: , Rfl:    Testosterone  200 MG PLLT, Take by implantation route., Disp: , Rfl:    tirzepatide  7.5 MG/0.5ML injection vial, Inject 7.5 mg into the skin once a week., Disp: 2 mL, Rfl: 1   triamcinolone  cream (KENALOG ) 0.5 %, Apply 1 Application topically 2 (two) times daily as needed., Disp: , Rfl:    Review of Systems:   Negative unless otherwise specified per HPI.  Vitals:   Vitals:   03/08/24 1455  BP: 120/80  Pulse: 88  Temp: (!) 97.3 F (36.3 C)  TempSrc: Temporal  SpO2: 98%  Weight: 194 lb 9.6 oz (88.3 kg)  Height: 5' 9 (1.753 m)  PF: 97 L/min     Body mass index is 28.74 kg/m.  Physical Exam:   Physical  Exam Vitals and nursing note reviewed.  Constitutional:      General: She is not in acute distress.    Appearance: She is well-developed. She is not ill-appearing or toxic-appearing.  Cardiovascular:     Rate and Rhythm: Normal rate and regular rhythm.     Pulses: Normal pulses.     Heart sounds: Normal heart sounds, S1 normal and S2 normal.  Pulmonary:     Effort: Pulmonary effort is normal.     Breath sounds: Normal breath sounds.  Abdominal:     General: Abdomen is flat. Bowel sounds are normal.     Palpations: Abdomen is soft.  Tenderness: There is abdominal tenderness in the left lower quadrant. There is no right CVA tenderness, left CVA tenderness, guarding or rebound.  Musculoskeletal:     Comments: Altered gait No tenderness to palpation to back  Skin:    General: Skin is warm and dry.  Neurological:     Mental Status: She is alert.     GCS: GCS eye subscore is 4. GCS verbal subscore is 5. GCS motor subscore is 6.     Comments: No decreased sensation to b/l lower extremity   Psychiatric:        Speech: Speech normal.        Behavior: Behavior normal. Behavior is cooperative.    Results for orders placed or performed in visit on 03/08/24  POCT Urinalysis Dipstick (Automated)  Result Value Ref Range   Color, UA yellow    Clarity, UA clear    Glucose, UA Negative Negative   Bilirubin, UA negative    Ketones, UA negative    Spec Grav, UA 1.010 1.010 - 1.025   Blood, UA negative    pH, UA 6.0 5.0 - 8.0   Protein, UA Negative Negative   Urobilinogen, UA 0.2 0.2 or 1.0 E.U./dL   Nitrite, UA negative    Leukocytes, UA Negative Negative    Assessment and Plan:   Assessment and Plan    LLQ abdominal pain Treat constipation Assess for urinary tract infection  If no improvement or obvious explanation, consider referral to sports medicine if pain persists. - Prescribe Tylenol  #3 for pain.  Constipation Constipation may contribute to abdominal pain. Herbal tea  used, Miralax ineffective. - Continue herbal tea. - Consider combining herbal tea with Miralax.  Gallstones CT shows gallstones. No classic symptoms but discussed potential issues with weight loss and medication. - Refer to general surgery for elective gallbladder removal consultation. - Advise to monitor for severe right upper quadrant pain, vomiting, or fever.  Urine leukocytes Leukocytes in urine suggest possible UTI. The patient has not experienced any burning, frequency, or blood in urine. - Recheck urine for leukocytes. - Send urine culture if leukocytes present. - Prescribe antibiotics if leukocytes persist.  Overweight On Zepbound  for weight loss. Considering dose reduction due to gallstones and constipation concerns. - Consider reducing Zepbound  dose to 7.5 mg. - Advise to delay Zepbound  if bowel movements are not regular.  Vaginitis Discussion of discharge and potential infections. Mild odor noted. - Perform self-swab for vaginal discharge. - Test for sexually transmitted infections if desired.     Pure hypercholesterolemia Update lipid panel and provide recommendations    Lucie Buttner, PA-C

## 2024-03-09 ENCOUNTER — Other Ambulatory Visit: Payer: Self-pay | Admitting: Physician Assistant

## 2024-03-09 ENCOUNTER — Other Ambulatory Visit

## 2024-03-09 ENCOUNTER — Ambulatory Visit: Payer: Self-pay | Admitting: Physician Assistant

## 2024-03-09 ENCOUNTER — Telehealth: Payer: Self-pay | Admitting: *Deleted

## 2024-03-09 ENCOUNTER — Other Ambulatory Visit (HOSPITAL_COMMUNITY)
Admission: RE | Admit: 2024-03-09 | Discharge: 2024-03-09 | Disposition: A | Discharge status: Home / Self Care | Source: Ambulatory Visit | Attending: Physician Assistant | Admitting: Physician Assistant

## 2024-03-09 DIAGNOSIS — E7800 Pure hypercholesterolemia, unspecified: Secondary | ICD-10-CM | POA: Diagnosis not present

## 2024-03-09 DIAGNOSIS — N760 Acute vaginitis: Secondary | ICD-10-CM | POA: Insufficient documentation

## 2024-03-09 DIAGNOSIS — R1032 Left lower quadrant pain: Secondary | ICD-10-CM

## 2024-03-09 LAB — COMPREHENSIVE METABOLIC PANEL WITH GFR
ALT: 15 U/L (ref 0–35)
AST: 15 U/L (ref 0–37)
Albumin: 4.8 g/dL (ref 3.5–5.2)
Alkaline Phosphatase: 76 U/L (ref 39–117)
BUN: 9 mg/dL (ref 6–23)
CO2: 32 meq/L (ref 19–32)
Calcium: 9.8 mg/dL (ref 8.4–10.5)
Chloride: 98 meq/L (ref 96–112)
Creatinine, Ser: 1.14 mg/dL (ref 0.40–1.20)
GFR: 61.45 mL/min (ref >60.00)
Glucose, Bld: 102 mg/dL — ABNORMAL HIGH (ref 70–99)
Potassium: 4.2 meq/L (ref 3.5–5.1)
Sodium: 138 meq/L (ref 135–145)
Total Bilirubin: 1 mg/dL (ref 0.2–1.2)
Total Protein: 7.8 g/dL (ref 6.0–8.3)

## 2024-03-09 LAB — CBC WITH DIFFERENTIAL/PLATELET
Basophils Absolute: 0.1 K/uL (ref 0.0–0.1)
Basophils Relative: 0.8 % (ref 0.0–3.0)
Eosinophils Absolute: 0.1 K/uL (ref 0.0–0.7)
Eosinophils Relative: 2.3 % (ref 0.0–5.0)
HCT: 47.5 % — ABNORMAL HIGH (ref 36.0–46.0)
Hemoglobin: 16.2 g/dL — ABNORMAL HIGH (ref 12.0–15.0)
Lymphocytes Relative: 52.8 % — ABNORMAL HIGH (ref 12.0–46.0)
Lymphs Abs: 3.2 K/uL (ref 0.7–4.0)
MCHC: 34 g/dL (ref 30.0–36.0)
MCV: 86.8 fl (ref 78.0–100.0)
Monocytes Absolute: 0.3 K/uL (ref 0.1–1.0)
Monocytes Relative: 4.7 % (ref 3.0–12.0)
Neutro Abs: 2.4 K/uL (ref 1.4–7.7)
Neutrophils Relative %: 39.4 % — ABNORMAL LOW (ref 43.0–77.0)
Platelets: 383 K/uL (ref 150.0–400.0)
RBC: 5.48 Mil/uL — ABNORMAL HIGH (ref 3.87–5.11)
RDW: 13.1 % (ref 11.5–15.5)
WBC: 6.1 K/uL (ref 4.0–10.5)

## 2024-03-09 LAB — LIPID PANEL
Cholesterol: 283 mg/dL — ABNORMAL HIGH (ref 0–200)
HDL: 33.3 mg/dL — ABNORMAL LOW (ref >39.00)
LDL Cholesterol: 216 mg/dL — ABNORMAL HIGH (ref 0–99)
NonHDL: 250.01
Total CHOL/HDL Ratio: 9
Triglycerides: 169 mg/dL — ABNORMAL HIGH (ref 0.0–149.0)
VLDL: 33.8 mg/dL (ref 0.0–40.0)

## 2024-03-09 LAB — LIPASE: Lipase: 69 U/L — ABNORMAL HIGH (ref 11.0–59.0)

## 2024-03-09 NOTE — Addendum Note (Signed)
 Addended by: THURMON ARLAND PARAS on: 03/09/2024 01:11 PM   Modules accepted: Orders

## 2024-03-09 NOTE — Telephone Encounter (Signed)
 Spoke to pt told her unfortunately the swab specimen needs to be redone due to no lab on tube. Pt verbalized understanding and said she will be by shortly to do other labs. Told her okay.

## 2024-03-09 NOTE — Telephone Encounter (Signed)
 Received call from [facility] in Cytology, they received swab specimen yesterday with no name on tube. Ronal said it will need to be recollected. Told her okay, thank you.

## 2024-03-10 ENCOUNTER — Encounter: Payer: Self-pay | Admitting: Physician Assistant

## 2024-03-10 ENCOUNTER — Other Ambulatory Visit: Payer: Self-pay | Admitting: Physician Assistant

## 2024-03-10 LAB — URINE CULTURE
MICRO NUMBER:: 17169875
MICRO NUMBER:: 17170946
SPECIMEN QUALITY:: ADEQUATE
SPECIMEN QUALITY:: ADEQUATE

## 2024-03-10 LAB — CERVICOVAGINAL ANCILLARY ONLY
Bacterial Vaginitis (gardnerella): POSITIVE — AB
Candida Glabrata: NEGATIVE
Candida Vaginitis: NEGATIVE
Chlamydia: NEGATIVE
Comment: NEGATIVE
Comment: NEGATIVE
Comment: NEGATIVE
Comment: NEGATIVE
Comment: NEGATIVE
Comment: NORMAL
Neisseria Gonorrhea: NEGATIVE
Trichomonas: NEGATIVE

## 2024-03-10 MED ORDER — ATORVASTATIN CALCIUM 20 MG PO TABS: 20.0000 mg | ORAL_TABLET | Freq: Every day | ORAL | 1 refills | Status: DC

## 2024-03-12 ENCOUNTER — Other Ambulatory Visit: Payer: Self-pay | Admitting: Physician Assistant

## 2024-03-12 MED ORDER — METRONIDAZOLE 500 MG PO TABS: 500.0000 mg | ORAL_TABLET | Freq: Two times a day (BID) | ORAL | 0 refills | Status: AC

## 2024-03-21 ENCOUNTER — Other Ambulatory Visit: Payer: Self-pay | Admitting: Physician Assistant

## 2024-03-21 ENCOUNTER — Ambulatory Visit: Payer: Self-pay | Admitting: Physician Assistant

## 2024-03-21 ENCOUNTER — Ambulatory Visit (HOSPITAL_BASED_OUTPATIENT_CLINIC_OR_DEPARTMENT_OTHER)
Admission: RE | Admit: 2024-03-21 | Discharge: 2024-03-21 | Disposition: A | Discharge status: Home / Self Care | Source: Ambulatory Visit | Attending: Physician Assistant | Admitting: Physician Assistant

## 2024-03-21 DIAGNOSIS — R1032 Left lower quadrant pain: Secondary | ICD-10-CM

## 2024-03-21 DIAGNOSIS — D259 Leiomyoma of uterus, unspecified: Secondary | ICD-10-CM | POA: Diagnosis not present

## 2024-03-21 DIAGNOSIS — N854 Malposition of uterus: Secondary | ICD-10-CM | POA: Diagnosis not present

## 2024-03-21 DIAGNOSIS — N83201 Unspecified ovarian cyst, right side: Secondary | ICD-10-CM | POA: Diagnosis not present

## 2024-03-24 ENCOUNTER — Other Ambulatory Visit

## 2024-03-29 DIAGNOSIS — R1032 Left lower quadrant pain: Secondary | ICD-10-CM | POA: Diagnosis not present

## 2024-03-29 DIAGNOSIS — N951 Menopausal and female climacteric states: Secondary | ICD-10-CM | POA: Diagnosis not present

## 2024-03-29 DIAGNOSIS — E039 Hypothyroidism, unspecified: Secondary | ICD-10-CM | POA: Diagnosis not present

## 2024-03-30 ENCOUNTER — Encounter: Payer: Self-pay | Admitting: Physician Assistant

## 2024-03-31 ENCOUNTER — Other Ambulatory Visit: Payer: Self-pay | Admitting: Physician Assistant

## 2024-03-31 DIAGNOSIS — R1030 Lower abdominal pain, unspecified: Secondary | ICD-10-CM

## 2024-04-03 DIAGNOSIS — F329 Major depressive disorder, single episode, unspecified: Secondary | ICD-10-CM | POA: Diagnosis not present

## 2024-04-03 DIAGNOSIS — F419 Anxiety disorder, unspecified: Secondary | ICD-10-CM | POA: Diagnosis not present

## 2024-04-03 DIAGNOSIS — F909 Attention-deficit hyperactivity disorder, unspecified type: Secondary | ICD-10-CM | POA: Diagnosis not present

## 2024-04-04 ENCOUNTER — Other Ambulatory Visit: Payer: Self-pay | Admitting: Physician Assistant

## 2024-04-04 ENCOUNTER — Encounter: Payer: Self-pay | Admitting: Physician Assistant

## 2024-04-04 MED ORDER — ZEPBOUND 2.5 MG/0.5ML ~~LOC~~ SOAJ: 2.5000 mg | SUBCUTANEOUS | 2 refills | Status: DC

## 2024-04-04 NOTE — Telephone Encounter (Signed)
 Please see message and advise. Pt currently on Zepbound  7.5 mg.

## 2024-04-10 ENCOUNTER — Ambulatory Visit: Admitting: Physician Assistant

## 2024-04-19 ENCOUNTER — Other Ambulatory Visit: Payer: Self-pay | Admitting: Physician Assistant

## 2024-04-19 ENCOUNTER — Encounter: Payer: Self-pay | Admitting: Physician Assistant

## 2024-04-19 ENCOUNTER — Ambulatory Visit: Payer: Self-pay | Admitting: Physician Assistant

## 2024-04-19 ENCOUNTER — Ambulatory Visit: Admitting: Physician Assistant

## 2024-04-19 VITALS — BP 120/78 | HR 71 | Temp 97.8°F | Ht 69.0 in | Wt 201.5 lb

## 2024-04-19 DIAGNOSIS — E7800 Pure hypercholesterolemia, unspecified: Secondary | ICD-10-CM

## 2024-04-19 DIAGNOSIS — E663 Overweight: Secondary | ICD-10-CM | POA: Diagnosis not present

## 2024-04-19 DIAGNOSIS — R748 Abnormal levels of other serum enzymes: Secondary | ICD-10-CM | POA: Diagnosis not present

## 2024-04-19 LAB — COMPREHENSIVE METABOLIC PANEL WITH GFR
ALT: 34 U/L (ref 0–35)
AST: 24 U/L (ref 0–37)
Albumin: 4.5 g/dL (ref 3.5–5.2)
Alkaline Phosphatase: 75 U/L (ref 39–117)
BUN: 13 mg/dL (ref 6–23)
CO2: 29 meq/L (ref 19–32)
Calcium: 9.6 mg/dL (ref 8.4–10.5)
Chloride: 102 meq/L (ref 96–112)
Creatinine, Ser: 0.85 mg/dL (ref 0.40–1.20)
GFR: 87.33 mL/min (ref >60.00)
Glucose, Bld: 80 mg/dL (ref 70–99)
Potassium: 3.9 meq/L (ref 3.5–5.1)
Sodium: 137 meq/L (ref 135–145)
Total Bilirubin: 0.6 mg/dL (ref 0.2–1.2)
Total Protein: 7.6 g/dL (ref 6.0–8.3)

## 2024-04-19 LAB — LIPID PANEL
Cholesterol: 249 mg/dL — ABNORMAL HIGH (ref 0–200)
HDL: 46.6 mg/dL (ref >39.00)
LDL Cholesterol: 181 mg/dL — ABNORMAL HIGH (ref 0–99)
NonHDL: 202.08
Total CHOL/HDL Ratio: 5
Triglycerides: 104 mg/dL (ref 0.0–149.0)
VLDL: 20.8 mg/dL (ref 0.0–40.0)

## 2024-04-19 LAB — LIPASE: Lipase: 40 U/L (ref 11.0–59.0)

## 2024-04-19 MED ORDER — ZEPBOUND 5 MG/0.5ML ~~LOC~~ SOAJ: 5.0000 mg | SUBCUTANEOUS | 1 refills | Status: DC

## 2024-04-19 NOTE — Progress Notes (Signed)
 History of Present Illness:   Chief Complaint  Patient presents with   Weight Management Screening    Pt is here for f/u on Zepbound , currently taking     Discussed the use of AI scribe software for clinical note transcription with the patient, who gave verbal consent to proceed.  History of Present Illness   [name] is a 38 year old female who presents with concerns about weight management.  She reports difficulty with weight management. She recently restarted Zepbound  2.5 mg without side effects. She previously did better on 5 mg dosages and higher, which helped her stop eating after 8:30 PM. Her weight increased from 180 to 200 pounds, and she would like to return to the 5 mg dose for improved results. She is not having any significant abdominal pain but continues to be evaluated by another provider to administer colonics and parasite treatment.  She requests cholesterol testing today and has been fasting for 8 hours. She also had abnormal lipase levels in the past and agrees to repeat lipase testing today.    Past Medical History:  Diagnosis Date   ADHD (attention deficit hyperactivity disorder)    Allergy    Anxiety    Asthma    Atypical chest pain    ED visit in epic 07-17-2022;  referred and evaluated by cardiologist-- dr m. branch , note in epic 08-26-2022   Complication of anesthesia    trouble waking up   Depression    History of gestational diabetes    History of pregnancy induced hypertension    HSV (herpes simplex virus) anogenital infection    Hypertension    Menorrhagia    Sleep apnea    Wears contact lenses      Social History   Tobacco Use   Smoking status: Never   Smokeless tobacco: Never  Vaping Use   Vaping status: Every Day   Substances: Nicotine, Flavoring   Devices: geek  Substance Use Topics   Alcohol  use: Yes    Comment: socially   Drug use: Never    Past Surgical History:  Procedure Laterality Date   CESAREAN SECTION N/A  05/06/2018   Procedure: CESAREAN SECTION;  Surgeon: Rosalva Sawyer, MD;  Location: [facility] BIRTHING SU[facility];  Service: Obstetrics;  Laterality: N/A;   DIAGNOSTIC LAPAROSCOPY  05/03/2007   @[facility]  ;  for chronic pelvic pain   ENDOMETRIAL ABLATION N/A 09/18/2022   Procedure: ENDOMETRIAL ABLATION WITH NOVASURE;  Surgeon: Rutherford Gain, MD;  Location: [facility] [place];  Service: Gynecology;  Laterality: N/A;   LUMBAR FUSION  2017   in [place] ;  L2--L5   TUBAL LIGATION N/A 09/18/2022   Procedure: LAPARASCOPIC BILATERAL TUBAL LIGATION;  Surgeon: Rutherford Gain, MD;  Location: [facility] [place];  Service: Gynecology;  Laterality: N/A;    Family History  Problem Relation Age of Onset   Hypertension Mother    Asthma Mother    Thyroid  disease Mother    Asthma Father    Hypertension Father    Heart disease Father    Hypertension Sister    Heart attack Maternal Grandmother    Heart attack Paternal Grandmother    Diabetes Paternal Grandmother    Stroke Paternal Grandmother    Kidney disease Paternal Grandmother    Thyroid  disease Paternal Grandmother    Heart attack Paternal Grandfather    COPD Paternal Grandfather    Anesthesia problems Neg Hx    Hypotension Neg Hx    Malignant hyperthermia Neg Hx  Pseudochol deficiency Neg Hx     Allergies  Allergen Reactions   Latex Itching   Morphine  And Codeine  Itching    Lower doses OK   Doxycycline Rash   Penicillins Rash and Other (See Comments)    Childhood reaction ( unknown) Has patient had a PCN reaction causing immediate rash, facial/tongue/throat swelling, SOB or lightheadedness with hypotension: Yes Has patient had a PCN reaction causing severe rash involving mucus membranes or skin necrosis: No Has patient had a PCN reaction that required hospitalization: No Has patient had a PCN reaction occurring within the last 10 years: No If all of the above answers are NO, then may proceed with Cephalosporin use.      Current Medications:   Current Outpatient Medications:    acetaminophen -codeine  (TYLENOL  #3) 300-30 MG tablet, Take 1-2 tablets by mouth every 4 (four) hours as needed for moderate pain (pain score 4-6)., Disp: 30 tablet, Rfl: 0   albuterol  (VENTOLIN  HFA) 108 (90 Base) MCG/ACT inhaler, Inhale 1-2 puffs into the lungs every 6 (six) hours as needed for wheezing or shortness of breath., Disp: , Rfl:    busPIRone (BUSPAR) 5 MG tablet, Take 5 mg by mouth 3 (three) times daily. (Patient taking differently: Take 5 mg by mouth 3 (three) times daily as needed.), Disp: , Rfl:    Cholecalciferol 50 MCG (2000 UT) CAPS, Take 1 capsule by mouth daily., Disp: , Rfl:    Ferrous Sulfate  (IRON) 325 (65 Fe) MG TABS, Take 1 tablet by mouth daily after lunch., Disp: , Rfl:    Red Yeast Rice Extract (RED YEAST RICE PO), Take 700 mg by mouth daily in the afternoon. (Patient taking differently: Take 1,200 mg by mouth daily in the afternoon.), Disp: , Rfl:    Testosterone  200 MG PLLT, Take by implantation route., Disp: , Rfl:    tirzepatide  (ZEPBOUND ) 2.5 MG/0.5ML Pen, Inject 2.5 mg into the skin once a week., Disp: 2 mL, Rfl: 2   triamcinolone  cream (KENALOG ) 0.5 %, Apply 1 Application topically 2 (two) times daily as needed., Disp: , Rfl:    Review of Systems:   Negative unless otherwise specified per HPI.  Vitals:   Vitals:   04/19/24 1125  BP: 120/78  Pulse: 71  Temp: 97.8 F (36.6 C)  TempSrc: Temporal  Weight: 201 lb 8 oz (91.4 kg)  Height: 5' 9 (1.753 m)     Body mass index is 29.76 kg/m.  Physical Exam:   Physical Exam Vitals and nursing note reviewed.  Constitutional:      General: She is not in acute distress.    Appearance: She is well-developed. She is not ill-appearing or toxic-appearing.  Cardiovascular:     Rate and Rhythm: Normal rate and regular rhythm.     Pulses: Normal pulses.     Heart sounds: Normal heart sounds, S1 normal and S2 normal.  Pulmonary:     Effort:  Pulmonary effort is normal.     Breath sounds: Normal breath sounds.  Skin:    General: Skin is warm and dry.  Neurological:     Mental Status: She is alert.     GCS: GCS eye subscore is 4. GCS verbal subscore is 5. GCS motor subscore is 6.  Psychiatric:        Speech: Speech normal.        Behavior: Behavior normal. Behavior is cooperative.     Assessment and Plan:   Assessment and Plan    Pure hypercholesterolemia She requested cholesterol  monitoring. - Ordered cholesterol test.  Elevated lipase Re-evaluation needed to monitor levels. - Ordered lipase test.  Overweight She restarted Zepbound  at 2.5 mg with weight gain, plans to increase to 5 mg for better management. Considering parasite cleanse and colonic treatment. - Increased Zepbound  to 5 mg. - Advised to hold Zepbound  during parasite cleanse and colonic treatment. - Instructed to monitor for worsening symptoms and adjust Zepbound  dose if necessary.          Lucie Buttner, PA-C

## 2024-04-25 ENCOUNTER — Encounter: Payer: Self-pay | Admitting: Physician Assistant

## 2024-05-02 ENCOUNTER — Other Ambulatory Visit (HOSPITAL_BASED_OUTPATIENT_CLINIC_OR_DEPARTMENT_OTHER)

## 2024-05-08 DIAGNOSIS — F909 Attention-deficit hyperactivity disorder, unspecified type: Secondary | ICD-10-CM | POA: Diagnosis not present

## 2024-05-08 DIAGNOSIS — N951 Menopausal and female climacteric states: Secondary | ICD-10-CM | POA: Diagnosis not present

## 2024-05-08 DIAGNOSIS — F419 Anxiety disorder, unspecified: Secondary | ICD-10-CM | POA: Diagnosis not present

## 2024-05-08 DIAGNOSIS — F329 Major depressive disorder, single episode, unspecified: Secondary | ICD-10-CM | POA: Diagnosis not present

## 2024-05-16 ENCOUNTER — Encounter: Payer: Self-pay | Admitting: Physician Assistant

## 2024-05-19 ENCOUNTER — Ambulatory Visit: Admitting: Physician Assistant

## 2024-05-19 ENCOUNTER — Ambulatory Visit: Payer: Self-pay | Admitting: Physician Assistant

## 2024-05-19 ENCOUNTER — Encounter: Payer: Self-pay | Admitting: Physician Assistant

## 2024-05-19 VITALS — BP 128/80 | HR 81 | Temp 97.3°F | Ht 69.0 in | Wt 201.2 lb

## 2024-05-19 DIAGNOSIS — E663 Overweight: Secondary | ICD-10-CM

## 2024-05-19 DIAGNOSIS — E7800 Pure hypercholesterolemia, unspecified: Secondary | ICD-10-CM | POA: Diagnosis not present

## 2024-05-19 DIAGNOSIS — R748 Abnormal levels of other serum enzymes: Secondary | ICD-10-CM | POA: Diagnosis not present

## 2024-05-19 LAB — LIPID PANEL
Cholesterol: 216 mg/dL — ABNORMAL HIGH (ref 28–200)
HDL: 36.8 mg/dL — ABNORMAL LOW
LDL Cholesterol: 164 mg/dL — ABNORMAL HIGH (ref 10–99)
NonHDL: 178.87
Total CHOL/HDL Ratio: 6
Triglycerides: 76 mg/dL (ref 10.0–149.0)
VLDL: 15.2 mg/dL (ref 0.0–40.0)

## 2024-05-19 LAB — CBC WITH DIFFERENTIAL/PLATELET
Basophils Absolute: 0 K/uL (ref 0.0–0.1)
Basophils Relative: 0.7 % (ref 0.0–3.0)
Eosinophils Absolute: 0.1 K/uL (ref 0.0–0.7)
Eosinophils Relative: 2.8 % (ref 0.0–5.0)
HCT: 42.1 % (ref 36.0–46.0)
Hemoglobin: 14.5 g/dL (ref 12.0–15.0)
Lymphocytes Relative: 56.5 % — ABNORMAL HIGH (ref 12.0–46.0)
Lymphs Abs: 2.8 K/uL (ref 0.7–4.0)
MCHC: 34.5 g/dL (ref 30.0–36.0)
MCV: 87.7 fl (ref 78.0–100.0)
Monocytes Absolute: 0.2 K/uL (ref 0.1–1.0)
Monocytes Relative: 4.6 % (ref 3.0–12.0)
Neutro Abs: 1.8 K/uL (ref 1.4–7.7)
Neutrophils Relative %: 35.4 % — ABNORMAL LOW (ref 43.0–77.0)
Platelets: 324 K/uL (ref 150.0–400.0)
RBC: 4.8 Mil/uL (ref 3.87–5.11)
RDW: 13.5 % (ref 11.5–15.5)
WBC: 5 K/uL (ref 4.0–10.5)

## 2024-05-19 LAB — LIPASE: Lipase: 40 U/L (ref 11.0–59.0)

## 2024-05-19 MED ORDER — ZEPBOUND 7.5 MG/0.5ML ~~LOC~~ SOAJ: 7.5000 mg | SUBCUTANEOUS | 0 refills | Status: AC

## 2024-05-19 NOTE — Patient Instructions (Signed)
 Wt Readings from Last 3 Encounters:  05/19/24 201 lb 4 oz (91.3 kg)  04/19/24 201 lb 8 oz (91.4 kg)  03/08/24 194 lb 9.6 oz (88.3 kg)

## 2024-05-19 NOTE — Progress Notes (Signed)
 "  History of Present Illness:   Chief Complaint  Patient presents with   Weight Management Screening    Pt here for f/u on Zepbound     Discussed the use of AI scribe software for clinical note transcription with the patient, who gave verbal consent to proceed.  History of Present Illness   [name] is a 39 year old female who presents for medication management and follow-up on cholesterol levels.  She is now taking Zepbound  7.5 mg after previously using 5 mg, has taken two doses without adverse effects, and is watching for rash. She started red yeast rice before her last labs, which showed a slight cholesterol decrease from 35 to 34.   She has increased physical activity with kickboxing and follows a high-protein diet without seafood. She has not yet completed a coronary calcium  score test.        Past Medical History:  Diagnosis Date   ADHD (attention deficit hyperactivity disorder)    Allergy    Anxiety    Asthma    Atypical chest pain    ED visit in epic 07-17-2022;  referred and evaluated by cardiologist-- dr m. branch , note in epic 08-26-2022   Complication of anesthesia    trouble waking up   Depression    History of gestational diabetes    History of pregnancy induced hypertension    HSV (herpes simplex virus) anogenital infection    Hypertension    Menorrhagia    Sleep apnea    Wears contact lenses      Social History[1]  Past Surgical History:  Procedure Laterality Date   CESAREAN SECTION N/A 05/06/2018   Procedure: CESAREAN SECTION;  Surgeon: Rosalva Sawyer, MD;  Location: [facility];  Service: Obstetrics;  Laterality: N/A;   DIAGNOSTIC LAPAROSCOPY  05/03/2007   @[facility]  ;  for chronic pelvic pain   ENDOMETRIAL ABLATION N/A 09/18/2022   Procedure: ENDOMETRIAL ABLATION WITH NOVASURE;  Surgeon: Rutherford Gain, MD;  Location: [facility];  Service: Gynecology;  Laterality: N/A;   LUMBAR FUSION  2017   in [place] ;  L2--L5   TUBAL  LIGATION N/A 09/18/2022   Procedure: LAPARASCOPIC BILATERAL TUBAL LIGATION;  Surgeon: Rutherford Gain, MD;  Location: [facility] [place];  Service: Gynecology;  Laterality: N/A;    Family History  Problem Relation Age of Onset   Hypertension Mother    Asthma Mother    Thyroid  disease Mother    Asthma Father    Hypertension Father    Heart disease Father    Hypertension Sister    Heart attack Maternal Grandmother    Heart attack Paternal Grandmother    Diabetes Paternal Grandmother    Stroke Paternal Grandmother    Kidney disease Paternal Grandmother    Thyroid  disease Paternal Grandmother    Heart attack Paternal Grandfather    COPD Paternal Grandfather    Anesthesia problems Neg Hx    Hypotension Neg Hx    Malignant hyperthermia Neg Hx    Pseudochol deficiency Neg Hx     Allergies[2]  Current Medications:  Current Medications[3]   Review of Systems:   Negative unless otherwise specified per HPI.  Vitals:   Vitals:   05/19/24 1041  BP: 128/80  Pulse: 81  Temp: (!) 97.3 F (36.3 C)  TempSrc: Temporal  Weight: 201 lb 4 oz (91.3 kg)  Height: 5' 9 (1.753 m)     Body mass index is 29.72 kg/m.  Physical Exam:   Physical  Exam Constitutional:      Appearance: Normal appearance. She is well-developed.  HENT:     Head: Normocephalic and atraumatic.  Eyes:     General: Lids are normal.     Extraocular Movements: Extraocular movements intact.     Conjunctiva/sclera: Conjunctivae normal.  Pulmonary:     Effort: Pulmonary effort is normal.  Musculoskeletal:        General: Normal range of motion.     Cervical back: Normal range of motion and neck supple.  Skin:    General: Skin is warm and dry.  Neurological:     Mental Status: She is alert and oriented to person, place, and time.  Psychiatric:        Attention and Perception: Attention and perception normal.        Mood and Affect: Mood normal.        Behavior: Behavior normal.        Thought  Content: Thought content normal.        Judgment: Judgment normal.     Assessment and Plan:   Assessment and Plan    Overweight Currently on Zepbound  7.5 mg with no adverse effects. Awaiting calcium  score test results. - Continue Zepbound  7.5 mg with monthly supply. She will notify us  soon if she wants to continue this dosage. - Ordered CBC. Has been high due to T use -- she is still using T. - Plan for calcium  score test.  Pure hypercholesterolemia  Engaged in kickboxing and increased protein intake. Monitoring weight and cholesterol levels. - Continue red yeast rice - Ordered cholesterol panel.  Elevated lipase  -Recheck today and advise further - Asymptomatic  General Health Maintenance Plans to see a gastroenterologist. - Follow up with gastroenterologist.         Lucie Buttner, PA-C    [1]  Social History Tobacco Use   Smoking status: Never   Smokeless tobacco: Never  Vaping Use   Vaping status: Every Day   Substances: Nicotine, Flavoring   Devices: geek  Substance Use Topics   Alcohol  use: Yes    Comment: socially   Drug use: Never  [2]  Allergies Allergen Reactions   Latex Itching   Morphine  And Codeine  Itching    Lower doses OK   Doxycycline Rash   Penicillins Rash and Other (See Comments)    Childhood reaction ( unknown) Has patient had a PCN reaction causing immediate rash, facial/tongue/throat swelling, SOB or lightheadedness with hypotension: Yes Has patient had a PCN reaction causing severe rash involving mucus membranes or skin necrosis: No Has patient had a PCN reaction that required hospitalization: No Has patient had a PCN reaction occurring within the last 10 years: No If all of the above answers are NO, then may proceed with Cephalosporin use.   [3]  Current Outpatient Medications:    acetaminophen -codeine  (TYLENOL  #3) 300-30 MG tablet, Take 1-2 tablets by mouth every 4 (four) hours as needed for moderate pain (pain score 4-6).,  Disp: 30 tablet, Rfl: 0   albuterol  (VENTOLIN  HFA) 108 (90 Base) MCG/ACT inhaler, Inhale 1-2 puffs into the lungs every 6 (six) hours as needed for wheezing or shortness of breath., Disp: , Rfl:    busPIRone (BUSPAR) 5 MG tablet, Take 5 mg by mouth 3 (three) times daily. (Patient taking differently: Take 5 mg by mouth 3 (three) times daily as needed.), Disp: , Rfl:    Cholecalciferol 50 MCG (2000 UT) CAPS, Take 1 capsule by mouth daily., Disp: , Rfl:  Ferrous Sulfate  (IRON) 325 (65 Fe) MG TABS, Take 1 tablet by mouth daily after lunch., Disp: , Rfl:    Red Yeast Rice Extract (RED YEAST RICE PO), Take 700 mg by mouth daily in the afternoon. (Patient taking differently: Take 1,200 mg by mouth daily in the afternoon.), Disp: , Rfl:    Testosterone  200 MG PLLT, Take by implantation route., Disp: , Rfl:    tirzepatide  (ZEPBOUND ) 7.5 MG/0.5ML Pen, Inject 7.5 mg into the skin once a week., Disp: 2 mL, Rfl: 0   triamcinolone  cream (KENALOG ) 0.5 %, Apply 1 Application topically 2 (two) times daily as needed., Disp: , Rfl:   "

## 2024-06-08 NOTE — Progress Notes (Signed)
 "     Samantha Console, PA-C [address] [place], [place]  [address] Phone: [phone]   Gastroenterology Consultation  Referring Provider:     Job Lukes, [place] Primary Care Physician:  Job Lukes, [place] Primary Gastroenterologist:  Samantha Console, PA-C / Glendia Holt, MD  Reason for Consultation:     Lower abdominal pain, Constipation, Rectal bleeding        HPI:   Discussed the use of AI scribe software for clinical note transcription with the patient, who gave verbal consent to proceed.  New patient.  Here to evaluate lower abdominal pain, constipation, rectal bleeding, possible parasite infection, and Hx iron deficiency (Hx Menorrhagia and uterine ablation).  PMH: Tobacco use, spinal fusion, gestational diabetes, chronic back pain, ADHD, hypertension, bilateral ovarian cysts.  03/2024 pelvic transvaginal ultrasound: 1. Small uterine fibroid. 2. Small cyst in the right ovary.  No imaging follow-up.  02/2024 CT abdomen pelvis with contrast: 1. No acute findings. Normal appendix. 2. Cholelithiasis without superimposed pericholecystic inflammatory change  03/2023 abdominal x-ray: Moderate stool burden consistent with constipation.  History of Present Illness [name] is a 39 year old female with chronic constipation who presents for evaluation of lower abdominal pain.  Abdominal Pain: - Intermittent pain initially more pronounced in the left lower quadrant, now involving both lower quadrants - Pain is currently present and fluctuates in intensity - No associated diarrhea or unusual weight loss  Altered Bowel Habits and Constipation: - Chronic constipation with bowel movements approximately every other day - Straining during defecation and passage of hard stools - Water intake is suboptimal; received intravenous fluids two weeks ago - Previous trials of Miralax, including multiple daily doses, were ineffective - Currently taking Zepbound , suspected to  contribute to constipation - Abdominal x-ray in November 2024 showed moderate stool burden consistent with constipation - Underwent colon hydrotherapy prior to this visit and after the 2020 x-ray  Rectal Bleeding: - Occasional rectal bleeding, typically associated with straining or hard stool - No blood seen mixed in the stool otherwise - Family history of hemorrhoids in her father -No family history of colon cancer - No prior colonoscopy  Suspected Intestinal Parasitic Infection: - Visible parasites observed during recent colon hydrotherapy - Significant distress regarding presence of worms and concern for her own and her child's health - No prior stool testing for parasites  Gallstones: - Abdominal/pelvic CT in October 2025 showed gallstones - No evidence of appendicitis, bowel inflammation, or obstruction on imaging - She has not had any upper abdominal pain or gallbladder symptoms.  Gynecologic History: - History of low iron and prior uterine ablation for menorrhagia - Iron and ferritin levels not checked recently, but previously experienced monthly depletion - Pelvic transvaginal ultrasound in November 2025 showed small uterine fibroids and a small right ovarian cyst - On testosterone  therapy for low levels following childbirth - Slightly low thyroid  reported     Past Medical History:  Diagnosis Date   ADHD (attention deficit hyperactivity disorder)    Allergy    Anxiety    Asthma    Atypical chest pain    ED visit in epic 07-17-2022;  referred and evaluated by cardiologist-- dr m. branch , note in epic 08-26-2022   Complication of anesthesia    trouble waking up   Depression    History of gestational diabetes    History of pregnancy induced hypertension    HSV (herpes simplex virus) anogenital infection    Hypertension    Menorrhagia  Sleep apnea    Wears contact lenses     Past Surgical History:  Procedure Laterality Date   CESAREAN SECTION N/A  05/06/2018   Procedure: CESAREAN SECTION;  Surgeon: Rosalva Sawyer, MD;  Location: [facility];  Service: Obstetrics;  Laterality: N/A;   DIAGNOSTIC LAPAROSCOPY  05/03/2007   @[facility]  ;  for chronic pelvic pain   ENDOMETRIAL ABLATION N/A 09/18/2022   Procedure: ENDOMETRIAL ABLATION WITH NOVASURE;  Surgeon: Rutherford Gain, MD;  Location: [facility];  Service: Gynecology;  Laterality: N/A;   LUMBAR FUSION  2017   in [place] ;  L2--L5   TUBAL LIGATION N/A 09/18/2022   Procedure: LAPARASCOPIC BILATERAL TUBAL LIGATION;  Surgeon: Rutherford Gain, MD;  Location: [facility] [place];  Service: Gynecology;  Laterality: N/A;    Prior to Admission medications  Medication Sig Start Date End Date Taking? Authorizing Provider  acetaminophen -codeine  (TYLENOL  #3) 300-30 MG tablet Take 1-2 tablets by mouth every 4 (four) hours as needed for moderate pain (pain score 4-6). 03/08/24   Job Lukes, PA  albuterol  (VENTOLIN  HFA) 108 (90 Base) MCG/ACT inhaler Inhale 1-2 puffs into the lungs every 6 (six) hours as needed for wheezing or shortness of breath.    [provider]  busPIRone (BUSPAR) 5 MG tablet Take 5 mg by mouth 3 (three) times daily. Patient taking differently: Take 5 mg by mouth 3 (three) times daily as needed. 07/07/23   [provider]  Cholecalciferol 50 MCG (2000 UT) CAPS Take 1 capsule by mouth daily.    [provider]  Ferrous Sulfate  (IRON) 325 (65 Fe) MG TABS Take 1 tablet by mouth daily after lunch.    [provider]  Red Yeast Rice Extract (RED YEAST RICE PO) Take 700 mg by mouth daily in the afternoon. Patient taking differently: Take 1,200 mg by mouth daily in the afternoon.    [provider]  Testosterone  200 MG PLLT Take by implantation route.    [provider]  tirzepatide  (ZEPBOUND ) 7.5 MG/0.5ML Pen Inject 7.5 mg into the skin once a week. 05/19/24   Job Lukes, PA  triamcinolone  cream  (KENALOG ) 0.5 % Apply 1 Application topically 2 (two) times daily as needed. 07/13/22   [provider]    Family History  Problem Relation Age of Onset   Hypertension Mother    Asthma Mother    Thyroid  disease Mother    Asthma Father    Hypertension Father    Heart disease Father    Hypertension Sister    Heart attack Maternal Grandmother    Heart attack Paternal Grandmother    Diabetes Paternal Grandmother    Stroke Paternal Grandmother    Kidney disease Paternal Grandmother    Thyroid  disease Paternal Grandmother    Heart attack Paternal Grandfather    COPD Paternal Grandfather    Anesthesia problems Neg Hx    Hypotension Neg Hx    Malignant hyperthermia Neg Hx    Pseudochol deficiency Neg Hx      Social History[1]  Allergies as of 06/09/2024 - Review Complete 06/09/2024  Allergen Reaction Noted   Latex Itching 02/25/2018   Morphine  and codeine  Itching 08/08/2011   Doxycycline Rash 09/09/2022   Penicillins Rash and Other (See Comments) 01/11/2011    Review of Systems:    All systems reviewed and negative except where noted in HPI.   Physical Exam:  BP 122/62   Pulse 92   Ht 5' 9 (1.753 m)   Wt 195  lb 8 oz (88.7 kg)   BMI 28.87 kg/m  No LMP recorded. Patient has had an ablation.  General:   Alert,  Well-developed, well-nourished, pleasant and cooperative in NAD Lungs:  Respirations even and unlabored.  Clear throughout to auscultation.   No wheezes, crackles, or rhonchi. No acute distress. Heart:  Regular rate and rhythm; no murmurs, clicks, rubs, or gallops. Abdomen:  Normal bowel sounds.  No bruits.  Soft, and non-distended without masses, hepatosplenomegaly or hernias noted.  Mild bilateral lower abdominal tenderness.  No upper abdominal tenderness.  No guarding or rebound tenderness.    Neurologic:  Alert and oriented x3;  grossly normal neurologically. Psych:  Alert and cooperative. Normal mood and affect.   Imaging Studies: No results  found.  Labs: CBC    Component Value Date/Time   WBC 5.3 06/09/2024 1518   RBC 5.24 (H) 06/09/2024 1518   HGB 15.8 (H) 06/09/2024 1518   HCT 45.6 06/09/2024 1518   PLT 290.0 06/09/2024 1518   MCV 87.1 06/09/2024 1518    CMP     Component Value Date/Time   NA 137 04/19/2024 1149   K 3.9 04/19/2024 1149   CL 102 04/19/2024 1149   CO2 29 04/19/2024 1149   GLUCOSE 80 04/19/2024 1149   BUN 13 04/19/2024 1149   CREATININE 0.85 04/19/2024 1149   CALCIUM  9.6 04/19/2024 1149   PROT 7.6 04/19/2024 1149   ALBUMIN 4.5 04/19/2024 1149   AST 24 04/19/2024 1149   ALT 34 04/19/2024 1149   ALKPHOS 75 04/19/2024 1149   BILITOT 0.6 04/19/2024 1149   GFRNONAA >60 03/07/2024 0105   GFRAA >60 05/10/2018 2315    Assessment and Plan:   SONIYAH MCGLORY is a 39 y.o. y/o female has been referred for   1.  Chronic lower abdominal pain: Suspect due to constipation Recent pelvic transvaginal ultrasound and abdominal pelvic CT showed no acute abnormality.  -Start samples of Linzess  72/145  2.  Chronic constipation - Lab TSH - Start Linzess  72/145 - High-fiber diet. - 64 ounces of water daily.  3.  Rectal bleeding - Scheduling Colonoscopy I discussed risks of colonoscopy with patient to include risk of bleeding, colon perforation, and risk of sedation.  Patient expressed understanding and agrees to proceed with colonoscopy.   4.  Suspected parasite observed in stool per patient -Ordered stool test ova and parasite for evaluation  5.  History of iron deficiency (likely due to menorrhagia s/p uterine ablation) - Labs CBC, iron panel, ferritin Assessment & Plan Chronic constipation Chronic, moderate constipation likely multifactorial with inadequate response to prior osmotic laxative therapy. - Provided linaclotide  samples: start 72 mcg daily for one week, increase to 145 mcg daily for one week, option to titrate to 290 mcg daily if ineffective. - Documented prior polyethylene glycol trial  and failure  - Discussed polyethylene glycol as safe adjunct with linaclotide  if needed. - Advised against colon hydrotherapy which only offers transient relief, not long-term solution. - Encouraged increased water intake and 30 g of dietary fiber daily. - Ordered thyroid  function tests to evaluate for hypothyroidism.  Rectal bleeding Intermittent, mild rectal bleeding likely due to straining and hard stools; further evaluation warranted. - Scheduled colonoscopy to evaluate rectal bleeding. - Ordered hemoglobin and iron studies for iron deficiency anemia. - Advised patient to contact her insurance for colonoscopy coverage and costs.  Suspected intestinal parasitic infection Stool testing indicated due to reported observation of possible parasites during colon hydrotherapy session. - Ordered stool  ova and parasite examination.  Follow up 4 weeks after colonoscopy with TG.  Samantha Console, PA-C       [1]  Social History Tobacco Use   Smoking status: Never   Smokeless tobacco: Never  Vaping Use   Vaping status: Every Day   Substances: Nicotine, Flavoring   Devices: geek  Substance Use Topics   Alcohol  use: Yes    Comment: socially   Drug use: Never   "

## 2024-06-09 ENCOUNTER — Ambulatory Visit: Admitting: Physician Assistant

## 2024-06-09 ENCOUNTER — Other Ambulatory Visit

## 2024-06-09 ENCOUNTER — Encounter: Payer: Self-pay | Admitting: Physician Assistant

## 2024-06-09 VITALS — BP 122/62 | HR 92 | Ht 69.0 in | Wt 195.5 lb

## 2024-06-09 DIAGNOSIS — R1030 Lower abdominal pain, unspecified: Secondary | ICD-10-CM

## 2024-06-09 DIAGNOSIS — Z8639 Personal history of other endocrine, nutritional and metabolic disease: Secondary | ICD-10-CM | POA: Diagnosis not present

## 2024-06-09 DIAGNOSIS — K625 Hemorrhage of anus and rectum: Secondary | ICD-10-CM

## 2024-06-09 DIAGNOSIS — K5904 Chronic idiopathic constipation: Secondary | ICD-10-CM | POA: Diagnosis not present

## 2024-06-09 DIAGNOSIS — K5909 Other constipation: Secondary | ICD-10-CM

## 2024-06-09 DIAGNOSIS — R195 Other fecal abnormalities: Secondary | ICD-10-CM

## 2024-06-09 DIAGNOSIS — R1084 Generalized abdominal pain: Secondary | ICD-10-CM

## 2024-06-09 LAB — CBC WITH DIFFERENTIAL/PLATELET
Basophils Absolute: 0.1 10*3/uL (ref 0.0–0.1)
Basophils Relative: 1 % (ref 0.0–3.0)
Eosinophils Absolute: 0.1 10*3/uL (ref 0.0–0.7)
Eosinophils Relative: 2.3 % (ref 0.0–5.0)
HCT: 45.6 % (ref 36.0–46.0)
Hemoglobin: 15.8 g/dL — ABNORMAL HIGH (ref 12.0–15.0)
Lymphocytes Relative: 54.6 % — ABNORMAL HIGH (ref 12.0–46.0)
Lymphs Abs: 2.9 10*3/uL (ref 0.7–4.0)
MCHC: 34.6 g/dL (ref 30.0–36.0)
MCV: 87.1 fl (ref 78.0–100.0)
Monocytes Absolute: 0.3 10*3/uL (ref 0.1–1.0)
Monocytes Relative: 4.8 % (ref 3.0–12.0)
Neutro Abs: 2 10*3/uL (ref 1.4–7.7)
Neutrophils Relative %: 37.3 % — ABNORMAL LOW (ref 43.0–77.0)
Platelets: 290 10*3/uL (ref 150.0–400.0)
RBC: 5.24 Mil/uL — ABNORMAL HIGH (ref 3.87–5.11)
RDW: 12.9 % (ref 11.5–15.5)
WBC: 5.3 10*3/uL (ref 4.0–10.5)

## 2024-06-09 LAB — TSH: TSH: 0.93 u[IU]/mL (ref 0.35–5.50)

## 2024-06-09 MED ORDER — NA SULFATE-K SULFATE-MG SULF 17.5-3.13-1.6 GM/177ML PO SOLN: 1.0000 | Freq: Once | ORAL | 0 refills | Status: AC

## 2024-06-09 MED ORDER — LINACLOTIDE 145 MCG PO CAPS: 145.0000 ug | ORAL_CAPSULE | Freq: Every day | ORAL | Status: AC

## 2024-06-09 MED ORDER — LINACLOTIDE 72 MCG PO CAPS: 72.0000 ug | ORAL_CAPSULE | Freq: Every day | ORAL | 0 refills | Status: AC

## 2024-06-09 NOTE — Patient Instructions (Addendum)
 For Constipation Try: Samples of Linzess  72 mcg QD for 1 week,  then 145 mcg QD for 1 week,  Please let me know which dose works best, and then we can send in a prescription.    Your provider has requested that you go to the basement level for lab work before leaving today. Press B on the elevator. The lab is located at the first door on the left as you exit the elevator.   You have been scheduled for a colonoscopy. Please follow written instructions given to you at your visit today.   If you use inhalers (even only as needed), please bring them with you on the day of your procedure.  DO NOT TAKE 7 DAYS PRIOR TO TEST- Trulicity (dulaglutide) Ozempic, Wegovy (semaglutide) Mounjaro , Zepbound  (tirzepatide ) Bydureon Bcise (exanatide extended release)  DO NOT TAKE 1 DAY PRIOR TO YOUR TEST Rybelsus (semaglutide) Adlyxin (lixisenatide) Victoza (liraglutide) Byetta (exanatide) ___________________________________________________________________________  _______________________________________________________  If your blood pressure at your visit was 140/90 or greater, please contact your primary care physician to follow up on this.  _______________________________________________________  If you are age 67 or older, your body mass index should be between 23-30. Your Body mass index is 28.87 kg/m. If this is out of the aforementioned range listed, please consider follow up with your Primary Care Provider.  If you are age 33 or younger, your body mass index should be between 19-25. Your Body mass index is 28.87 kg/m. If this is out of the aformentioned range listed, please consider follow up with your Primary Care Provider.   ________________________________________________________  The [facility] providers would like to encourage you to use MYCHART to communicate with providers for non-urgent requests or questions.  Due to long hold times on the telephone, sending your provider a  message by [facility] may be a faster and more efficient way to get a response.  Please allow 48 business hours for a response.  Please remember that this is for non-urgent requests.  _______________________________________________________  [facility] is using a team-based approach to care.  Your team is made up of your doctor and two to three APPS. Our APPS (Nurse Practitioners and Physician Assistants) work with your physician to ensure care continuity for you. They are fully qualified to address your health concerns and develop a treatment plan. They communicate directly with your gastroenterologist to care for you. Seeing the Advanced Practice Practitioners on your physician's team can help you by facilitating care more promptly, often allowing for earlier appointments, access to diagnostic testing, procedures, and other specialty referrals.

## 2024-06-10 LAB — IRON,TIBC AND FERRITIN PANEL
%SAT: 27 % (ref 16–45)
Ferritin: 110 ng/mL (ref 16–154)
Iron: 82 ug/dL (ref 40–190)
TIBC: 307 ug/dL (ref 250–450)

## 2024-06-12 ENCOUNTER — Encounter: Payer: Self-pay | Admitting: Gastroenterology

## 2024-06-12 ENCOUNTER — Ambulatory Visit: Payer: Self-pay | Admitting: Physician Assistant

## 2024-06-13 NOTE — Progress Notes (Signed)
 Agree with the assessment and plan as outlined by Brigitte Canard, PA-C.

## 2024-06-14 ENCOUNTER — Other Ambulatory Visit

## 2024-06-14 DIAGNOSIS — R195 Other fecal abnormalities: Secondary | ICD-10-CM

## 2024-06-15 ENCOUNTER — Encounter: Payer: Self-pay | Admitting: Gastroenterology

## 2024-06-15 ENCOUNTER — Ambulatory Visit: Admitting: Gastroenterology

## 2024-06-15 VITALS — BP 116/80 | HR 68 | Temp 97.5°F | Resp 15 | Ht 69.0 in | Wt 195.0 lb

## 2024-06-15 DIAGNOSIS — K625 Hemorrhage of anus and rectum: Secondary | ICD-10-CM

## 2024-06-15 DIAGNOSIS — R195 Other fecal abnormalities: Secondary | ICD-10-CM

## 2024-06-15 DIAGNOSIS — R1084 Generalized abdominal pain: Secondary | ICD-10-CM

## 2024-06-15 DIAGNOSIS — K5904 Chronic idiopathic constipation: Secondary | ICD-10-CM | POA: Diagnosis present

## 2024-06-15 MED ORDER — AMBULATORY NON FORMULARY MEDICATION: 0 refills | Status: AC

## 2024-06-15 MED ORDER — SODIUM CHLORIDE 0.9 % IV SOLN: 500.0000 mL | INTRAVENOUS | Status: DC

## 2024-06-15 NOTE — Progress Notes (Signed)
 History and Physical Interval Note:  06/15/2024 11:00 AM  [name]  has presented today for endoscopic procedure(s), with the diagnosis of  Encounter Diagnoses  Name Primary?   Chronic idiopathic constipation Yes   Rectal bleeding    Nonspecific abnormal finding in stool contents    Abdominal pain, generalized   .  The various methods of evaluation and treatment have been discussed with the patient and/or family. After consideration of risks, benefits and other options for treatment, the patient has consented to  the endoscopic procedure(s).   The patient's history has been reviewed, patient examined, no change in status, stable for endoscopic procedure(s).  I have reviewed the patient's chart and labs.  Questions were answered to the patient's satisfaction.     Zarrah Loveland E. Stacia, MD [facility]

## 2024-06-15 NOTE — Progress Notes (Signed)
 Pt's states no medical or surgical changes since previsit or office visit.

## 2024-06-15 NOTE — Patient Instructions (Addendum)
 Handout provided on High fiber diet.  Resume previous diet.  Continue present medications, including Linzess . Recommend using a fiber supplement like Metamucil, daily to help with constipation.  Repeat colonoscopy in 10 years for screening purposes.  Follow up in the office for ongoing management of chronic constipation and abdominal pain (see appointment details).   Use prescribed Nitroglycerin ointment twice daily, apply a pea sized amount to anal canal.  Your prescription has been called in to [facility]. They will call you when it is ready to be picked up.   [facility]'s information is below:  Address: [address] [place], [place], [place] [address]  [phone]    YOU HAD AN ENDOSCOPIC PROCEDURE TODAY AT THE  ENDOSCOPY CENTER:   Refer to the procedure report that was given to you for any specific questions about what was found during the examination.  If the procedure report does not answer your questions, please call your gastroenterologist to clarify.  If you requested that your care partner not be given the details of your procedure findings, then the procedure report has been included in a sealed envelope for you to review at your convenience later.  YOU SHOULD EXPECT: Some feelings of bloating in the abdomen. Passage of more gas than usual.  Walking can help get rid of the air that was put into your GI tract during the procedure and reduce the bloating. If you had a lower endoscopy (such as a colonoscopy or flexible sigmoidoscopy) you may notice spotting of blood in your stool or on the toilet paper. If you underwent a bowel prep for your procedure, you may not have a normal bowel movement for a few days.  Please Note:  You might notice some irritation and congestion in your nose or some drainage.  This is from the oxygen used during your procedure.  There is no need for concern and it should clear up in a day or so.  SYMPTOMS TO REPORT  IMMEDIATELY:  Following lower endoscopy (colonoscopy or flexible sigmoidoscopy):  Excessive amounts of blood in the stool  Significant tenderness or worsening of abdominal pains  Swelling of the abdomen that is new, acute  Fever of 100F or higher  For urgent or emergent issues, a gastroenterologist can be reached at any hour by calling [phone]. Do not use MyChart messaging for urgent concerns.    DIET:  We do recommend a small meal at first, but then you may proceed to your regular diet.  Drink plenty of fluids but you should avoid alcoholic beverages for 24 hours.  ACTIVITY:  You should plan to take it easy for the rest of today and you should NOT DRIVE or use heavy machinery until tomorrow (because of the sedation medicines used during the test).    FOLLOW UP: Our staff will call the number listed on your records the next business day following your procedure.  We will call around 7:15- 8:00 am to check on you and address any questions or concerns that you may have regarding the information given to you following your procedure. If we do not reach you, we will leave a message.     If any biopsies were taken you will be contacted by phone or by letter within the next 1-3 weeks.  Please call us  at [phone] if you have not heard about the biopsies in 3 weeks.    SIGNATURES/CONFIDENTIALITY: You and/or your care partner have signed paperwork which will be entered into your  electronic medical record.  These signatures attest to the fact that that the information above on your After Visit Summary has been reviewed and is understood.  Full responsibility of the confidentiality of this discharge information lies with you and/or your care-partner.

## 2024-06-15 NOTE — Progress Notes (Signed)
 Vss nad trans to pacu

## 2024-06-15 NOTE — Op Note (Signed)
  [facility] Patient Name: [name] Procedure Date: 06/15/2024 10:51 AM MRN: [id number] Endoscopist: Glendia E. Stacia , MD, [id number] Age: 39 Referring MD:  Date of Birth: 1986/02/18 Gender: Female Account #: [id number] Procedure:                Colonoscopy Indications:              Lower abdominal pain, Hematochezia Medicines:                Monitored Anesthesia Care Procedure:                Pre-Anesthesia Assessment:                           - Prior to the procedure, a History and Physical                            was performed, and patient medications and                            allergies were reviewed. The patient's tolerance of                            previous anesthesia was also reviewed. The risks                            and benefits of the procedure and the sedation                            options and risks were discussed with the patient.                            All questions were answered, and informed consent                            was obtained. Prior Anticoagulants: The patient has                            taken no anticoagulant or antiplatelet agents. ASA                            Grade Assessment: II - A patient with mild systemic                            disease. After reviewing the risks and benefits,                            the patient was deemed in satisfactory condition to                            undergo the procedure.                           After obtaining informed consent, the colonoscope  was passed under direct vision. Throughout the                            procedure, the patient's blood pressure, pulse, and                            oxygen saturations were monitored continuously. The                            CF [id number] #[id number] was introduced through the anus                            and advanced to the the terminal ileum, with                            identification of  the appendiceal orifice and IC                            valve. The colonoscopy was performed without                            difficulty. The patient tolerated the procedure                            well. The quality of the bowel preparation was                            good. The terminal ileum, ileocecal valve,                            appendiceal orifice, and rectum were photographed.                            The bowel preparation used was SUPREP via split                            dose instruction. Scope In: 11:11:34 AM Scope Out: 11:24:46 AM Scope Withdrawal Time: 0 hours 9 minutes 27 seconds  Total Procedure Duration: 0 hours 13 minutes 12 seconds  Findings:                 The perianal and digital rectal examinations were                            normal. Pertinent negatives include normal                            sphincter tone and no palpable rectal lesions.                           The colon (entire examined portion) appeared normal.                           The terminal ileum appeared normal.  The retroflexed view of the distal rectum and anal                            verge was normal and showed no anal or rectal                            abnormalities. Complications:            No immediate complications. Estimated Blood Loss:     Estimated blood loss: none. Impression:               - The entire examined colon is normal.                           - The examined portion of the ileum was normal.                           - The distal rectum and anal verge are normal on                            retroflexion view. Internal hemorrhoids did not                            appear particularly enlarged or prominent.                           - No specimens collected.                           - Patient's bright red blood per rectum can be                            attributed to bleeding from internal hemorrhoids,                             given absence of any other bleeding sources. Recommendation:           - Patient has a contact number available for                            emergencies. The signs and symptoms of potential                            delayed complications were discussed with the                            patient. Return to normal activities tomorrow.                            Written discharge instructions were provided to the                            patient.                           - Resume previous  diet.                           - Continue present medications, including Linzess .                           - Repeat colonoscopy in 10 years for screening                            purposes.                           - Follow up in the office for ongoing managment of                            chronic constipation and abdominal pain. [name] E. Stacia, MD 06/15/2024 11:31:49 AM This report has been signed electronically.

## 2024-06-16 ENCOUNTER — Telehealth: Payer: Self-pay

## 2024-06-16 LAB — OVA AND PARASITE EXAMINATION
CONCENTRATE RESULT:: NONE SEEN
MICRO NUMBER:: 17548169
SPECIMEN QUALITY:: ADEQUATE
TRICHROME RESULT:: NONE SEEN

## 2024-06-16 NOTE — Telephone Encounter (Signed)
 LMOM

## 2024-07-13 ENCOUNTER — Ambulatory Visit: Admitting: Physician Assistant

## 2024-08-02 ENCOUNTER — Ambulatory Visit: Admitting: Gastroenterology
